# Patient Record
Sex: Female | Born: 1940 | Hispanic: Refuse to answer | Marital: Single | Smoking: Former smoker
Health system: Southern US, Community
[De-identification: ages and names within clinical notes are randomized; demographics above are authoritative.]

## PROBLEM LIST (undated history)

## (undated) DIAGNOSIS — I2510 Atherosclerotic heart disease of native coronary artery without angina pectoris: Secondary | ICD-10-CM

## (undated) DIAGNOSIS — Z8719 Personal history of other diseases of the digestive system: Secondary | ICD-10-CM

## (undated) DIAGNOSIS — R519 Headache, unspecified: Secondary | ICD-10-CM

## (undated) DIAGNOSIS — H538 Other visual disturbances: Secondary | ICD-10-CM

## (undated) DIAGNOSIS — Z972 Presence of dental prosthetic device (complete) (partial): Secondary | ICD-10-CM

## (undated) DIAGNOSIS — J189 Pneumonia, unspecified organism: Secondary | ICD-10-CM

## (undated) DIAGNOSIS — F419 Anxiety disorder, unspecified: Secondary | ICD-10-CM

## (undated) DIAGNOSIS — I739 Peripheral vascular disease, unspecified: Secondary | ICD-10-CM

## (undated) DIAGNOSIS — N183 Chronic kidney disease, stage 3 unspecified: Secondary | ICD-10-CM

## (undated) DIAGNOSIS — M316 Other giant cell arteritis: Secondary | ICD-10-CM

## (undated) DIAGNOSIS — R42 Dizziness and giddiness: Secondary | ICD-10-CM

## (undated) DIAGNOSIS — T8859XA Other complications of anesthesia, initial encounter: Secondary | ICD-10-CM

## (undated) DIAGNOSIS — T884XXA Failed or difficult intubation, initial encounter: Secondary | ICD-10-CM

## (undated) DIAGNOSIS — M1990 Unspecified osteoarthritis, unspecified site: Secondary | ICD-10-CM

## (undated) DIAGNOSIS — I10 Essential (primary) hypertension: Secondary | ICD-10-CM

## (undated) DIAGNOSIS — E039 Hypothyroidism, unspecified: Secondary | ICD-10-CM

## (undated) DIAGNOSIS — E079 Disorder of thyroid, unspecified: Secondary | ICD-10-CM

## (undated) DIAGNOSIS — D649 Anemia, unspecified: Secondary | ICD-10-CM

## (undated) DIAGNOSIS — F32A Depression, unspecified: Secondary | ICD-10-CM

## (undated) DIAGNOSIS — M5136 Other intervertebral disc degeneration, lumbar region: Secondary | ICD-10-CM

## (undated) DIAGNOSIS — R011 Cardiac murmur, unspecified: Secondary | ICD-10-CM

## (undated) DIAGNOSIS — M51369 Other intervertebral disc degeneration, lumbar region without mention of lumbar back pain or lower extremity pain: Secondary | ICD-10-CM

## (undated) HISTORY — PX: SALIVARY GLAND SURGERY: SHX768

## (undated) HISTORY — PX: TUBAL LIGATION: SHX77

---

## 1998-03-06 HISTORY — PX: THYROID SURGERY: SHX805

## 2003-05-06 ENCOUNTER — Encounter: Payer: Self-pay | Admitting: Internal Medicine

## 2003-05-07 ENCOUNTER — Encounter: Payer: Self-pay | Admitting: Internal Medicine

## 2003-12-09 ENCOUNTER — Encounter: Payer: Self-pay | Admitting: General Practice

## 2003-12-22 ENCOUNTER — Ambulatory Visit: Payer: Self-pay | Admitting: Internal Medicine

## 2004-01-05 ENCOUNTER — Encounter: Payer: Self-pay | Admitting: General Practice

## 2004-03-01 ENCOUNTER — Ambulatory Visit: Payer: Self-pay | Admitting: Unknown Physician Specialty

## 2004-03-15 ENCOUNTER — Ambulatory Visit: Payer: Self-pay | Admitting: Unknown Physician Specialty

## 2004-04-12 ENCOUNTER — Ambulatory Visit: Payer: Self-pay | Admitting: Unknown Physician Specialty

## 2005-01-27 ENCOUNTER — Ambulatory Visit: Payer: Self-pay | Admitting: Internal Medicine

## 2005-05-02 ENCOUNTER — Ambulatory Visit: Payer: Self-pay | Admitting: Unknown Physician Specialty

## 2005-08-23 ENCOUNTER — Ambulatory Visit: Payer: Self-pay | Admitting: Unknown Physician Specialty

## 2005-09-01 ENCOUNTER — Ambulatory Visit: Payer: Self-pay | Admitting: Unknown Physician Specialty

## 2006-04-02 ENCOUNTER — Ambulatory Visit: Payer: Self-pay | Admitting: Otolaryngology

## 2006-05-08 ENCOUNTER — Ambulatory Visit: Payer: Self-pay | Admitting: Otolaryngology

## 2006-08-24 ENCOUNTER — Ambulatory Visit: Payer: Self-pay | Admitting: Internal Medicine

## 2006-09-05 ENCOUNTER — Other Ambulatory Visit: Payer: Self-pay

## 2006-09-05 ENCOUNTER — Emergency Department: Payer: Self-pay | Admitting: Emergency Medicine

## 2006-09-12 ENCOUNTER — Ambulatory Visit: Payer: Self-pay | Admitting: Unknown Physician Specialty

## 2007-02-14 ENCOUNTER — Ambulatory Visit: Payer: Self-pay | Admitting: Unknown Physician Specialty

## 2007-03-12 ENCOUNTER — Encounter: Payer: Self-pay | Admitting: Psychiatry

## 2007-04-07 ENCOUNTER — Encounter: Payer: Self-pay | Admitting: Psychiatry

## 2007-05-05 ENCOUNTER — Encounter: Payer: Self-pay | Admitting: Psychiatry

## 2007-05-17 ENCOUNTER — Other Ambulatory Visit: Payer: Self-pay

## 2007-05-17 ENCOUNTER — Emergency Department: Payer: Self-pay | Admitting: Emergency Medicine

## 2007-06-21 ENCOUNTER — Ambulatory Visit: Payer: Self-pay | Admitting: Otolaryngology

## 2007-09-25 ENCOUNTER — Ambulatory Visit: Payer: Self-pay | Admitting: Unknown Physician Specialty

## 2008-09-25 ENCOUNTER — Ambulatory Visit: Payer: Self-pay | Admitting: Unknown Physician Specialty

## 2009-01-07 ENCOUNTER — Encounter: Payer: Self-pay | Admitting: Internal Medicine

## 2009-01-11 ENCOUNTER — Encounter: Payer: Self-pay | Admitting: Internal Medicine

## 2009-01-11 ENCOUNTER — Ambulatory Visit: Payer: Self-pay | Admitting: Unknown Physician Specialty

## 2009-01-18 ENCOUNTER — Encounter: Payer: Self-pay | Admitting: Internal Medicine

## 2009-01-21 ENCOUNTER — Encounter (INDEPENDENT_AMBULATORY_CARE_PROVIDER_SITE_OTHER): Payer: Self-pay | Admitting: *Deleted

## 2009-02-05 ENCOUNTER — Ambulatory Visit: Payer: Self-pay | Admitting: Internal Medicine

## 2009-02-05 DIAGNOSIS — K5732 Diverticulitis of large intestine without perforation or abscess without bleeding: Secondary | ICD-10-CM | POA: Insufficient documentation

## 2009-02-05 DIAGNOSIS — K589 Irritable bowel syndrome without diarrhea: Secondary | ICD-10-CM

## 2009-02-23 ENCOUNTER — Telehealth: Payer: Self-pay | Admitting: Internal Medicine

## 2009-06-30 ENCOUNTER — Ambulatory Visit: Payer: Self-pay | Admitting: Urology

## 2009-09-27 ENCOUNTER — Ambulatory Visit: Payer: Self-pay | Admitting: Unknown Physician Specialty

## 2010-09-29 ENCOUNTER — Ambulatory Visit: Payer: Self-pay | Admitting: Unknown Physician Specialty

## 2011-05-26 ENCOUNTER — Ambulatory Visit: Payer: Self-pay | Admitting: Unknown Physician Specialty

## 2011-06-06 ENCOUNTER — Encounter: Payer: Self-pay | Admitting: Unknown Physician Specialty

## 2011-07-05 ENCOUNTER — Encounter: Payer: Self-pay | Admitting: Unknown Physician Specialty

## 2011-10-19 ENCOUNTER — Ambulatory Visit: Payer: Self-pay | Admitting: Unknown Physician Specialty

## 2012-04-06 ENCOUNTER — Emergency Department: Payer: Self-pay | Admitting: Emergency Medicine

## 2012-04-06 LAB — CBC
MCV: 95 fL (ref 80–100)
Platelet: 247 10*3/uL (ref 150–440)
RBC: 3.83 10*6/uL (ref 3.80–5.20)
RDW: 13.1 % (ref 11.5–14.5)
WBC: 8.2 10*3/uL (ref 3.6–11.0)

## 2012-04-06 LAB — COMPREHENSIVE METABOLIC PANEL
Albumin: 3.5 g/dL (ref 3.4–5.0)
BUN: 15 mg/dL (ref 7–18)
Bilirubin,Total: 0.3 mg/dL (ref 0.2–1.0)
Chloride: 107 mmol/L (ref 98–107)
Creatinine: 1.35 mg/dL — ABNORMAL HIGH (ref 0.60–1.30)
EGFR (African American): 45 — ABNORMAL LOW
Glucose: 104 mg/dL — ABNORMAL HIGH (ref 65–99)
Osmolality: 284 (ref 275–301)
SGPT (ALT): 20 U/L (ref 12–78)
Total Protein: 7.1 g/dL (ref 6.4–8.2)

## 2012-04-07 LAB — MAGNESIUM: Magnesium: 2 mg/dL

## 2012-04-07 LAB — TROPONIN I: Troponin-I: <0.02 ng/mL

## 2012-04-07 LAB — CK TOTAL AND CKMB (NOT AT ARMC): CK, Total: 119 U/L (ref 21–215)

## 2012-10-21 ENCOUNTER — Ambulatory Visit: Payer: Self-pay | Admitting: Unknown Physician Specialty

## 2012-12-26 ENCOUNTER — Emergency Department: Payer: Self-pay | Admitting: Internal Medicine

## 2012-12-30 ENCOUNTER — Emergency Department: Payer: Self-pay | Admitting: Emergency Medicine

## 2012-12-30 LAB — COMPREHENSIVE METABOLIC PANEL
Alkaline Phosphatase: 72 U/L (ref 50–136)
Bilirubin,Total: 0.2 mg/dL (ref 0.2–1.0)
Calcium, Total: 8.4 mg/dL — ABNORMAL LOW (ref 8.5–10.1)
Chloride: 109 mmol/L — ABNORMAL HIGH (ref 98–107)
EGFR (African American): 42 — ABNORMAL LOW
Osmolality: 280 (ref 275–301)
SGOT(AST): 32 U/L (ref 15–37)
SGPT (ALT): 23 U/L (ref 12–78)
Sodium: 139 mmol/L (ref 136–145)
Total Protein: 6.9 g/dL (ref 6.4–8.2)

## 2012-12-30 LAB — CBC
HCT: 34 % — ABNORMAL LOW (ref 35.0–47.0)
MCH: 33 pg (ref 26.0–34.0)
Platelet: 211 10*3/uL (ref 150–440)
RDW: 13.3 % (ref 11.5–14.5)

## 2012-12-30 LAB — URINALYSIS, COMPLETE
Nitrite: NEGATIVE
Ph: 6 (ref 4.5–8.0)
Protein: NEGATIVE
RBC,UR: 5 /HPF (ref 0–5)

## 2013-01-04 ENCOUNTER — Emergency Department: Payer: Self-pay | Admitting: Emergency Medicine

## 2013-05-07 ENCOUNTER — Encounter: Payer: Self-pay | Admitting: Internal Medicine

## 2013-07-31 DIAGNOSIS — E559 Vitamin D deficiency, unspecified: Secondary | ICD-10-CM | POA: Insufficient documentation

## 2013-08-01 ENCOUNTER — Ambulatory Visit: Payer: Self-pay | Admitting: Unknown Physician Specialty

## 2013-08-06 LAB — PATHOLOGY REPORT

## 2013-09-24 ENCOUNTER — Encounter: Payer: Self-pay | Admitting: Internal Medicine

## 2013-10-06 ENCOUNTER — Encounter: Payer: Self-pay | Admitting: Internal Medicine

## 2013-10-07 ENCOUNTER — Ambulatory Visit: Payer: Self-pay | Admitting: Obstetrics and Gynecology

## 2013-10-22 ENCOUNTER — Ambulatory Visit: Payer: Self-pay | Admitting: Internal Medicine

## 2013-11-04 ENCOUNTER — Encounter: Payer: Self-pay | Admitting: Internal Medicine

## 2013-11-29 ENCOUNTER — Encounter: Payer: Self-pay | Admitting: Internal Medicine

## 2014-02-10 DIAGNOSIS — E782 Mixed hyperlipidemia: Secondary | ICD-10-CM | POA: Insufficient documentation

## 2014-02-10 DIAGNOSIS — I10 Essential (primary) hypertension: Secondary | ICD-10-CM | POA: Insufficient documentation

## 2014-02-18 ENCOUNTER — Ambulatory Visit: Payer: Self-pay | Admitting: Internal Medicine

## 2014-04-21 DIAGNOSIS — E890 Postprocedural hypothyroidism: Secondary | ICD-10-CM | POA: Insufficient documentation

## 2014-08-10 ENCOUNTER — Other Ambulatory Visit: Payer: Self-pay | Admitting: Internal Medicine

## 2014-08-10 DIAGNOSIS — Z1231 Encounter for screening mammogram for malignant neoplasm of breast: Secondary | ICD-10-CM

## 2014-10-26 ENCOUNTER — Ambulatory Visit: Payer: Self-pay | Attending: Internal Medicine

## 2014-10-30 DIAGNOSIS — N183 Chronic kidney disease, stage 3 unspecified: Secondary | ICD-10-CM | POA: Insufficient documentation

## 2014-11-05 ENCOUNTER — Ambulatory Visit: Payer: Self-pay

## 2014-11-13 ENCOUNTER — Ambulatory Visit
Admission: RE | Admit: 2014-11-13 | Discharge: 2014-11-13 | Disposition: A | Discharge status: Home / Self Care | Payer: Medicare Other | Source: Ambulatory Visit | Attending: Internal Medicine | Admitting: Internal Medicine

## 2014-11-13 ENCOUNTER — Other Ambulatory Visit: Payer: Self-pay | Admitting: Internal Medicine

## 2014-11-13 DIAGNOSIS — R911 Solitary pulmonary nodule: Secondary | ICD-10-CM | POA: Insufficient documentation

## 2014-11-13 DIAGNOSIS — Z1231 Encounter for screening mammogram for malignant neoplasm of breast: Secondary | ICD-10-CM

## 2015-03-08 ENCOUNTER — Emergency Department
Admission: EM | Admit: 2015-03-08 | Discharge: 2015-03-08 | Disposition: A | Discharge status: Home / Self Care | Payer: Medicare Other | Attending: Emergency Medicine | Admitting: Emergency Medicine

## 2015-03-08 ENCOUNTER — Emergency Department: Payer: Medicare Other

## 2015-03-08 ENCOUNTER — Encounter: Payer: Self-pay | Admitting: Emergency Medicine

## 2015-03-08 DIAGNOSIS — I10 Essential (primary) hypertension: Secondary | ICD-10-CM | POA: Insufficient documentation

## 2015-03-08 DIAGNOSIS — R51 Headache: Secondary | ICD-10-CM | POA: Insufficient documentation

## 2015-03-08 DIAGNOSIS — Z880 Allergy status to penicillin: Secondary | ICD-10-CM | POA: Diagnosis not present

## 2015-03-08 DIAGNOSIS — R519 Headache, unspecified: Secondary | ICD-10-CM

## 2015-03-08 HISTORY — DX: Essential (primary) hypertension: I10

## 2015-03-08 HISTORY — DX: Disorder of thyroid, unspecified: E07.9

## 2015-03-08 LAB — CBC
HCT: 37.3 % (ref 35.0–47.0)
HEMOGLOBIN: 12.6 g/dL (ref 12.0–16.0)
MCH: 32.1 pg (ref 26.0–34.0)
MCHC: 33.9 g/dL (ref 32.0–36.0)
MCV: 94.8 fL (ref 80.0–100.0)
Platelets: 240 10*3/uL (ref 150–440)
RBC: 3.94 MIL/uL (ref 3.80–5.20)
RDW: 13.3 % (ref 11.5–14.5)
WBC: 8.6 10*3/uL (ref 3.6–11.0)

## 2015-03-08 LAB — BASIC METABOLIC PANEL
ANION GAP: 7 (ref 5–15)
BUN: 16 mg/dL (ref 6–20)
CALCIUM: 9.5 mg/dL (ref 8.9–10.3)
CO2: 27 mmol/L (ref 22–32)
Chloride: 108 mmol/L (ref 101–111)
Creatinine, Ser: 1.14 mg/dL — ABNORMAL HIGH (ref 0.44–1.00)
GFR calc Af Amer: 53 mL/min — ABNORMAL LOW (ref >60)
GFR calc non Af Amer: 46 mL/min — ABNORMAL LOW (ref >60)
GLUCOSE: 105 mg/dL — AB (ref 65–99)
Potassium: 3.8 mmol/L (ref 3.5–5.1)
Sodium: 142 mmol/L (ref 135–145)

## 2015-03-08 LAB — URINALYSIS COMPLETE WITH MICROSCOPIC (ARMC ONLY)
BACTERIA UA: NONE SEEN
BILIRUBIN URINE: NEGATIVE
GLUCOSE, UA: NEGATIVE mg/dL
Ketones, ur: NEGATIVE mg/dL
Nitrite: NEGATIVE
Protein, ur: NEGATIVE mg/dL
SPECIFIC GRAVITY, URINE: 1.009 (ref 1.005–1.030)
pH: 6 (ref 5.0–8.0)

## 2015-03-08 MED ORDER — HYDRALAZINE HCL 20 MG/ML IJ SOLN
10.0000 mg | Freq: Once | INTRAMUSCULAR | Status: AC
  Administered 2015-03-08: 10 mg via INTRAVENOUS

## 2015-03-08 MED ORDER — HYDRALAZINE HCL 20 MG/ML IJ SOLN
INTRAMUSCULAR | Status: AC
  Administered 2015-03-08: 10 mg via INTRAVENOUS
  Filled 2015-03-08: qty 1

## 2015-03-08 MED ORDER — BUTALBITAL-APAP-CAFFEINE 50-325-40 MG PO TABS
2.0000 | ORAL_TABLET | Freq: Once | ORAL | Status: AC
  Administered 2015-03-08: 2 via ORAL

## 2015-03-08 MED ORDER — LOSARTAN POTASSIUM 25 MG PO TABS
25.0000 mg | ORAL_TABLET | Freq: Once | ORAL | Status: AC
  Administered 2015-03-08: 25 mg via ORAL
  Filled 2015-03-08 (×2): qty 1

## 2015-03-08 MED ORDER — BUTALBITAL-APAP-CAFFEINE 50-325-40 MG PO TABS
ORAL_TABLET | ORAL | Status: AC
  Filled 2015-03-08: qty 2

## 2015-03-08 MED ORDER — LORAZEPAM 2 MG/ML IJ SOLN
1.0000 mg | Freq: Once | INTRAMUSCULAR | Status: AC
  Administered 2015-03-08: 1 mg via INTRAVENOUS
  Filled 2015-03-08: qty 1

## 2015-03-08 NOTE — Discharge Instructions (Signed)
General Headache Without Cause °A headache is pain or discomfort felt around the head or neck area. The specific cause of a headache may not be found. There are many causes and types of headaches. A few common ones are: °· Tension headaches. °· Migraine headaches. °· Cluster headaches. °· Chronic daily headaches. °HOME CARE INSTRUCTIONS  °Watch your condition for any changes. Take these steps to help with your condition: °Managing Pain °· Take over-the-counter and prescription medicines only as told by your health care provider. °· Lie down in a dark, quiet room when you have a headache. °· If directed, apply ice to the head and neck area: °· Put ice in a plastic bag. °· Place a towel between your skin and the bag. °· Leave the ice on for 20 minutes, 2-3 times per day. °· Use a heating pad or hot shower to apply heat to the head and neck area as told by your health care provider. °· Keep lights dim if bright lights bother you or make your headaches worse. °Eating and Drinking °· Eat meals on a regular schedule. °· Limit alcohol use. °· Decrease the amount of caffeine you drink, or stop drinking caffeine. °General Instructions °· Keep all follow-up visits as told by your health care provider. This is important. °· Keep a headache journal to help find out what may trigger your headaches. For example, write down: °· What you eat and drink. °· How much sleep you get. °· Any change to your diet or medicines. °· Try massage or other relaxation techniques. °· Limit stress. °· Sit up straight, and do not tense your muscles. °· Do not use tobacco products, including cigarettes, chewing tobacco, or e-cigarettes. If you need help quitting, ask your health care provider. °· Exercise regularly as told by your health care provider. °· Sleep on a regular schedule. Get 7-9 hours of sleep, or the amount recommended by your health care provider. °SEEK MEDICAL CARE IF:  °· Your symptoms are not helped by medicine. °· You have a  headache that is different from the usual headache. °· You have nausea or you vomit. °· You have a fever. °SEEK IMMEDIATE MEDICAL CARE IF:  °· Your headache becomes severe. °· You have repeated vomiting. °· You have a stiff neck. °· You have a loss of vision. °· You have problems with speech. °· You have pain in the eye or ear. °· You have muscular weakness or loss of muscle control. °· You lose your balance or have trouble walking. °· You feel faint or pass out. °· You have confusion. °  °This information is not intended to replace advice given to you by your health care provider. Make sure you discuss any questions you have with your health care provider. °  °Document Released: 02/20/2005 Document Revised: 11/11/2014 Document Reviewed: 06/15/2014 °Elsevier Interactive Patient Education ©2016 [employer] ° °Hypertension °Hypertension is another name for high blood pressure. High blood pressure forces your heart to work harder to pump blood. A blood pressure reading has two numbers, which includes a higher number over a lower number (example: 110/72). °HOME CARE  °· Have your blood pressure rechecked by your doctor. °· Only take medicine as told by your doctor. Follow the directions carefully. The medicine does not work as well if you skip doses. Skipping doses also puts you at risk for problems. °· Do not smoke. °· Monitor your blood pressure at home as told by your doctor. °GET HELP IF: °· You think you   are having a reaction to the medicine you are taking. °· You have repeat headaches or feel dizzy. °· You have puffiness (swelling) in your ankles. °· You have trouble with your vision. °GET HELP RIGHT AWAY IF:  °· You get a very bad headache and are confused. °· You feel weak, numb, or faint. °· You get chest or belly (abdominal) pain. °· You throw up (vomit). °· You cannot breathe very well. °MAKE SURE YOU:  °· Understand these instructions. °· Will watch your condition. °· Will get help right away if you are  not doing well or get worse. °  °This information is not intended to replace advice given to you by your health care provider. Make sure you discuss any questions you have with your health care provider. °  °Document Released: 08/09/2007 Document Revised: 02/25/2013 Document Reviewed: 12/13/2012 °Elsevier Interactive Patient Education ©2016 [employer]vier Inc[employer] ° °

## 2015-03-08 NOTE — ED Notes (Signed)
Pt to CT via stretcher

## 2015-03-08 NOTE — ED Provider Notes (Signed)
[facility] Emergency Department Provider Note  Time seen: 7:28 PM  I have reviewed the triage vital signs and the nursing notes.   HISTORY  Chief Complaint Hypertension; Dizziness; and Headache    HPI Kristi Maldonado is a 75 y.o. female with a past medical history of hypertension, hypothyroidism, who presents the emergency department with hypertension and headache. According to the patient for the past 3 days she has been expressing intermittent headaches. She states this was very abnormal for her so when she developed a headache today she took her blood pressure at home and it was 200 systolic. She states she went to a neighbor's house and took her blood pressure there as well and it was over 200. She then went to another neighbor's house was a Engineer, civil (consulting) and took her blood pressure there and it was 200 so she came to the emergency department for evaluation. States a history of hypertension for which she takes Cozaar 25 mg daily. States she continues to have a mild headache currently. Denies any slurred speech, confusion, focal weakness or numbness.Patient states her main concern is she does not want to "stroke out" from her high blood pressure.     Past Medical History  Diagnosis Date  . Hypertension   . Thyroid disease     Patient Active Problem List   Diagnosis Date Noted  . DIVERTICULITIS, COLON 02/05/2009  . IBS 02/05/2009    No past surgical history on file.  No current outpatient prescriptions on file.  Allergies Penicillins  Family History  Problem Relation Age of Onset  . Breast cancer Maternal Aunt     two aunts    Social History Social History  Substance Use Topics  . Smoking status: Never Smoker   . Smokeless tobacco: None  . Alcohol Use: No    Review of Systems Constitutional: Negative for fever. Cardiovascular: Negative for chest pain. Respiratory: Negative for shortness of breath. Gastrointestinal: Negative for abdominal  pain Neurological: Positive for headache. Denies focal weakness or numbness. States headaches are very abnormal for her. 10-point ROS otherwise negative.  ____________________________________________   PHYSICAL EXAM:  VITAL SIGNS: ED Triage Vitals  Enc Vitals Group     BP 03/08/15 1845 176/80 mmHg     Pulse Rate 03/08/15 1845 62     Resp 03/08/15 1845 20     Temp 03/08/15 1845 98.1 F (36.7 C)     Temp Source 03/08/15 1845 Oral     SpO2 03/08/15 1845 99 %     Weight 03/08/15 1845 140 lb (63.504 kg)     Height 03/08/15 1845 5\' 7"  (1.702 m)     Head Cir --      Peak Flow --      Pain Score 03/08/15 1841 3     Pain Loc --      Pain Edu? --      Excl. in GC? --     Constitutional: Alert and oriented. Well appearing, appears anxious. Eyes: Normal exam ENT   Head: Normocephalic and atraumatic.   Mouth/Throat: Mucous membranes are moist. Cardiovascular: Normal rate, regular rhythm. No murmur Respiratory: Normal respiratory effort without tachypnea nor retractions. Breath sounds are clear and equal bilaterally. No wheezes/rales/rhonchi. Gastrointestinal: Soft and nontender. No distention. Musculoskeletal: Nontender with normal range of motion in all extremities. Neurologic:  Normal speech and language. No gross focal neurologic deficits Skin:  Skin is warm, dry and intact.  Psychiatric: Mood and affect are normal. Speech and behavior are normal.  ____________________________________________     RADIOLOGY  CT head shows no acute abnormality  EKG reviewed and interpreted by myself shows sinus bradycardia at 55 bpm, narrow QRS, normal axis, normal interval, no ST changes. Overall normal EKG. ____________________________________________   INITIAL IMPRESSION / ASSESSMENT AND PLAN / ED COURSE  Pertinent labs & imaging results that were available during my care of the patient were reviewed by me and considered in my medical decision making (see chart for  details).  Patient presents the emergency department for hypertension greater in 200 systolic at home, also with 3 days of intermittent headache. We will check basic labs, CT head, and treat the patient with Ativan given her considerable anxiety over her blood pressure, as well as dose her Cozaar which she states she forgot to take today. Patient has a very normal exam besides appearing anxious, no neuro deficits identified.  CT head shows no acute abnormality. Labs are within normal limits. Patient continues have blood pressure in excess of 180/190. Dose hydralazine in the emergency department. Patient states her normal blood pressures approximately 110-120 systolic.  Patient's blood pressure 174/79. We'll discharge patient home with primary care follow-up. Patient agreeable to plan. ____________________________________________   FINAL CLINICAL IMPRESSION(S) / ED DIAGNOSES  Headache Hypertension   Minna AntisKevin Rosemond Lyttle, MD 03/08/15 2103

## 2015-03-08 NOTE — ED Notes (Signed)
Pt reports HTN today, dizziness and headache. Reports sx started about 1730. Pt alert and oriented upon arrival to ED, bilateral grip strength equal, no facial droop noted.

## 2015-03-08 NOTE — ED Notes (Signed)
MD at bedside. 

## 2015-03-23 ENCOUNTER — Other Ambulatory Visit: Payer: Self-pay | Admitting: Internal Medicine

## 2015-03-23 DIAGNOSIS — R911 Solitary pulmonary nodule: Secondary | ICD-10-CM

## 2015-04-01 ENCOUNTER — Ambulatory Visit: Payer: Medicare Other

## 2015-06-11 ENCOUNTER — Ambulatory Visit
Admission: RE | Admit: 2015-06-11 | Discharge: 2015-06-11 | Disposition: A | Discharge status: Home / Self Care | Payer: Medicare Other | Source: Ambulatory Visit | Attending: Internal Medicine | Admitting: Internal Medicine

## 2015-06-11 DIAGNOSIS — I700 Atherosclerosis of aorta: Secondary | ICD-10-CM | POA: Diagnosis not present

## 2015-06-11 DIAGNOSIS — J479 Bronchiectasis, uncomplicated: Secondary | ICD-10-CM | POA: Insufficient documentation

## 2015-06-11 DIAGNOSIS — R911 Solitary pulmonary nodule: Secondary | ICD-10-CM | POA: Insufficient documentation

## 2015-06-11 LAB — POCT I-STAT CREATININE: CREATININE: 1.2 mg/dL — AB (ref 0.44–1.00)

## 2015-06-11 MED ORDER — IOPAMIDOL (ISOVUE-300) INJECTION 61%
75.0000 mL | Freq: Once | INTRAVENOUS | Status: AC | PRN
  Administered 2015-06-11: 75 mL via INTRAVENOUS

## 2016-01-19 ENCOUNTER — Other Ambulatory Visit: Payer: Self-pay | Admitting: Internal Medicine

## 2016-01-19 DIAGNOSIS — Z1231 Encounter for screening mammogram for malignant neoplasm of breast: Secondary | ICD-10-CM

## 2016-03-02 ENCOUNTER — Ambulatory Visit
Admission: RE | Admit: 2016-03-02 | Discharge: 2016-03-02 | Disposition: A | Discharge status: Home / Self Care | Payer: Medicare Other | Source: Ambulatory Visit | Attending: Internal Medicine | Admitting: Internal Medicine

## 2016-03-02 DIAGNOSIS — Z1231 Encounter for screening mammogram for malignant neoplasm of breast: Secondary | ICD-10-CM | POA: Insufficient documentation

## 2016-04-28 ENCOUNTER — Other Ambulatory Visit: Payer: Self-pay | Admitting: Internal Medicine

## 2016-04-28 DIAGNOSIS — R911 Solitary pulmonary nodule: Secondary | ICD-10-CM

## 2016-05-08 ENCOUNTER — Ambulatory Visit
Admission: RE | Admit: 2016-05-08 | Discharge: 2016-05-08 | Disposition: A | Discharge status: Home / Self Care | Payer: Medicare Other | Source: Ambulatory Visit | Attending: Internal Medicine | Admitting: Internal Medicine

## 2016-05-08 DIAGNOSIS — R911 Solitary pulmonary nodule: Secondary | ICD-10-CM | POA: Diagnosis present

## 2016-05-08 DIAGNOSIS — K449 Diaphragmatic hernia without obstruction or gangrene: Secondary | ICD-10-CM | POA: Insufficient documentation

## 2016-05-08 DIAGNOSIS — R918 Other nonspecific abnormal finding of lung field: Secondary | ICD-10-CM | POA: Diagnosis not present

## 2016-06-22 ENCOUNTER — Other Ambulatory Visit: Payer: Self-pay | Admitting: Internal Medicine

## 2016-06-22 DIAGNOSIS — M5137 Other intervertebral disc degeneration, lumbosacral region: Secondary | ICD-10-CM

## 2016-06-22 DIAGNOSIS — M5416 Radiculopathy, lumbar region: Secondary | ICD-10-CM

## 2016-06-30 ENCOUNTER — Ambulatory Visit
Admission: RE | Admit: 2016-06-30 | Discharge: 2016-06-30 | Disposition: A | Discharge status: Home / Self Care | Payer: Medicare Other | Source: Ambulatory Visit | Attending: Internal Medicine | Admitting: Internal Medicine

## 2016-06-30 DIAGNOSIS — K5730 Diverticulosis of large intestine without perforation or abscess without bleeding: Secondary | ICD-10-CM | POA: Diagnosis not present

## 2016-06-30 DIAGNOSIS — M4185 Other forms of scoliosis, thoracolumbar region: Secondary | ICD-10-CM | POA: Insufficient documentation

## 2016-06-30 DIAGNOSIS — M5416 Radiculopathy, lumbar region: Secondary | ICD-10-CM | POA: Insufficient documentation

## 2016-06-30 DIAGNOSIS — M4804 Spinal stenosis, thoracic region: Secondary | ICD-10-CM | POA: Insufficient documentation

## 2016-06-30 DIAGNOSIS — M48061 Spinal stenosis, lumbar region without neurogenic claudication: Secondary | ICD-10-CM | POA: Diagnosis not present

## 2016-06-30 DIAGNOSIS — M5137 Other intervertebral disc degeneration, lumbosacral region: Secondary | ICD-10-CM | POA: Insufficient documentation

## 2016-06-30 DIAGNOSIS — R609 Edema, unspecified: Secondary | ICD-10-CM | POA: Diagnosis not present

## 2016-06-30 LAB — POCT I-STAT CREATININE: CREATININE: 1.3 mg/dL — AB (ref 0.44–1.00)

## 2016-06-30 MED ORDER — GADOBENATE DIMEGLUMINE 529 MG/ML IV SOLN
10.0000 mL | Freq: Once | INTRAVENOUS | Status: AC | PRN
  Administered 2016-06-30: 10 mL via INTRAVENOUS

## 2017-01-29 ENCOUNTER — Other Ambulatory Visit: Payer: Self-pay | Admitting: Internal Medicine

## 2017-01-29 DIAGNOSIS — Z1231 Encounter for screening mammogram for malignant neoplasm of breast: Secondary | ICD-10-CM

## 2017-02-05 ENCOUNTER — Other Ambulatory Visit: Payer: Self-pay | Admitting: Internal Medicine

## 2017-02-05 ENCOUNTER — Ambulatory Visit
Admission: RE | Admit: 2017-02-05 | Discharge: 2017-02-05 | Disposition: A | Discharge status: Home / Self Care | Payer: Medicare Other | Source: Ambulatory Visit | Attending: Internal Medicine | Admitting: Internal Medicine

## 2017-02-05 ENCOUNTER — Ambulatory Visit: Payer: Medicare Other

## 2017-02-05 DIAGNOSIS — M419 Scoliosis, unspecified: Secondary | ICD-10-CM | POA: Diagnosis not present

## 2017-02-05 DIAGNOSIS — R109 Unspecified abdominal pain: Secondary | ICD-10-CM | POA: Diagnosis present

## 2017-02-05 DIAGNOSIS — K429 Umbilical hernia without obstruction or gangrene: Secondary | ICD-10-CM | POA: Diagnosis not present

## 2017-02-05 DIAGNOSIS — N200 Calculus of kidney: Secondary | ICD-10-CM | POA: Diagnosis not present

## 2017-02-15 ENCOUNTER — Emergency Department
Admission: EM | Admit: 2017-02-15 | Discharge: 2017-02-15 | Disposition: A | Discharge status: Home / Self Care | Payer: Medicare Other | Attending: Emergency Medicine | Admitting: Emergency Medicine

## 2017-02-15 ENCOUNTER — Other Ambulatory Visit: Payer: Self-pay

## 2017-02-15 ENCOUNTER — Encounter: Payer: Self-pay | Admitting: Emergency Medicine

## 2017-02-15 DIAGNOSIS — R1110 Vomiting, unspecified: Secondary | ICD-10-CM | POA: Diagnosis present

## 2017-02-15 DIAGNOSIS — K529 Noninfective gastroenteritis and colitis, unspecified: Secondary | ICD-10-CM | POA: Insufficient documentation

## 2017-02-15 LAB — CBC WITH DIFFERENTIAL/PLATELET
Basophils Absolute: 0.1 10*3/uL (ref 0–0.1)
Basophils Relative: 1 %
Eosinophils Absolute: 0.1 10*3/uL (ref 0–0.7)
Eosinophils Relative: 1 %
HEMATOCRIT: 37.3 % (ref 35.0–47.0)
HEMOGLOBIN: 13 g/dL (ref 12.0–16.0)
LYMPHS ABS: 1.7 10*3/uL (ref 1.0–3.6)
Lymphocytes Relative: 17 %
MCH: 32.9 pg (ref 26.0–34.0)
MCHC: 34.8 g/dL (ref 32.0–36.0)
MCV: 94.6 fL (ref 80.0–100.0)
MONOS PCT: 5 %
Monocytes Absolute: 0.5 10*3/uL (ref 0.2–0.9)
NEUTROS ABS: 7.7 10*3/uL — AB (ref 1.4–6.5)
NEUTROS PCT: 76 %
Platelets: 246 10*3/uL (ref 150–440)
RBC: 3.95 MIL/uL (ref 3.80–5.20)
RDW: 13.7 % (ref 11.5–14.5)
WBC: 10.1 10*3/uL (ref 3.6–11.0)

## 2017-02-15 LAB — COMPREHENSIVE METABOLIC PANEL
ALT: 17 U/L (ref 14–54)
ANION GAP: 9 (ref 5–15)
AST: 26 U/L (ref 15–41)
Albumin: 4 g/dL (ref 3.5–5.0)
Alkaline Phosphatase: 64 U/L (ref 38–126)
BILIRUBIN TOTAL: 0.3 mg/dL (ref 0.3–1.2)
BUN: 24 mg/dL — ABNORMAL HIGH (ref 6–20)
CALCIUM: 9.5 mg/dL (ref 8.9–10.3)
CO2: 25 mmol/L (ref 22–32)
Chloride: 105 mmol/L (ref 101–111)
Creatinine, Ser: 1.37 mg/dL — ABNORMAL HIGH (ref 0.44–1.00)
GFR, EST AFRICAN AMERICAN: 42 mL/min — AB (ref >60)
GFR, EST NON AFRICAN AMERICAN: 36 mL/min — AB (ref >60)
GLUCOSE: 125 mg/dL — AB (ref 65–99)
Potassium: 3.3 mmol/L — ABNORMAL LOW (ref 3.5–5.1)
Sodium: 139 mmol/L (ref 135–145)
TOTAL PROTEIN: 7.6 g/dL (ref 6.5–8.1)

## 2017-02-15 LAB — LIPASE, BLOOD: Lipase: 38 U/L (ref 11–51)

## 2017-02-15 MED ORDER — ONDANSETRON 4 MG PO TBDP: 4.0000 mg | ORAL_TABLET | Freq: Three times a day (TID) | ORAL | 0 refills | Status: DC | PRN

## 2017-02-15 MED ORDER — ONDANSETRON HCL 4 MG/2ML IJ SOLN
4.0000 mg | Freq: Once | INTRAMUSCULAR | Status: AC
  Administered 2017-02-15: 4 mg via INTRAVENOUS
  Filled 2017-02-15: qty 2

## 2017-02-15 MED ORDER — SODIUM CHLORIDE 0.9 % IV BOLUS (SEPSIS)
1000.0000 mL | Freq: Once | INTRAVENOUS | Status: AC
  Administered 2017-02-15: 1000 mL via INTRAVENOUS

## 2017-02-15 MED ORDER — POTASSIUM CHLORIDE 20 MEQ PO PACK
40.0000 meq | PACK | Freq: Once | ORAL | Status: AC
  Administered 2017-02-15: 40 meq via ORAL
  Filled 2017-02-15: qty 2

## 2017-02-15 NOTE — ED Triage Notes (Signed)
Pt arrived to the ED from home for complaints of vomiting and diarrhea. Pt states that she woke up with nausea, vomiting and 4 episodes of diarrhea. Pt is AOx4 in no apparent distress.

## 2017-02-15 NOTE — ED Provider Notes (Signed)
[facility] Emergency Department Provider Note   First MD Initiated Contact with Patient 02/15/17 [phone]     (approximate)  I have reviewed the triage vital signs and the nursing notes.   HISTORY  Chief Complaint Emesis and Diarrhea    HPI Towana BadgerBarbara Harren is a 76 y.o. female to the emergency department acute onset of vomiting and diarrhea this morning.  Patient states that she has had multiple episodes of vomiting and 4 episodes of nonbloody diarrhea since awakening this morning.  The patient does admit to abdominal cramping.  Patient states that she ate a chicken salad sandwich last night and is concerned that this may be the culprit of her symptoms.  Patient denies any fever no urinary symptoms.   Past Medical History:  Diagnosis Date  . Hypertension   . Thyroid disease     Patient Active Problem List   Diagnosis Date Noted  . DIVERTICULITIS, COLON 02/05/2009  . IBS 02/05/2009    History reviewed. No pertinent surgical history.  Prior to Admission medications   Not on File    Allergies Penicillins  Family History  Problem Relation Age of Onset  . Breast cancer Maternal Aunt        two aunts  . Breast cancer Cousin     Social History Social History   Tobacco Use  . Smoking status: Never Smoker  . Smokeless tobacco: Never Used  Substance Use Topics  . Alcohol use: No  . Drug use: No    Review of Systems Constitutional: No fever/chills Eyes: No visual changes. ENT: No sore throat. Cardiovascular: Denies chest pain. Respiratory: Denies shortness of breath. Gastrointestinal: No abdominal pain.  No nausea, no vomiting.  No diarrhea.  No constipation. Genitourinary: Negative for dysuria. Musculoskeletal: Negative for neck pain.  Negative for back pain. Integumentary: Negative for rash. Neurological: Negative for headaches, focal weakness or numbness.   ____________________________________________   PHYSICAL EXAM:  VITAL  SIGNS: ED Triage Vitals  Enc Vitals Group     BP 02/15/17 0501 (!) 156/69     Pulse Rate 02/15/17 0501 (!) 53     Resp 02/15/17 0501 16     Temp 02/15/17 0501 97.7 F (36.5 C)     Temp Source 02/15/17 0501 Oral     SpO2 02/15/17 0501 99 %     Weight 02/15/17 0502 65.8 kg (145 lb)     Height 02/15/17 0502 1.727 m (5\' 8" )     Head Circumference --      Peak Flow --      Pain Score 02/15/17 0501 0     Pain Loc --      Pain Edu? --      Excl. in GC? --     Constitutional: Alert and oriented. Well appearing and in no acute distress. Eyes: Conjunctivae are normal.  Head: Atraumatic. Mouth/Throat: Mucous membranes are moist.  Oropharynx non-erythematous. Neck: No stridor. Cardiovascular: Normal rate, regular rhythm. Good peripheral circulation. Grossly normal heart sounds. Respiratory: Normal respiratory effort.  No retractions. Lungs CTAB. Gastrointestinal: Soft and nontender. No distention.  Musculoskeletal: No lower extremity tenderness nor edema. No gross deformities of extremities. Neurologic:  Normal speech and language. No gross focal neurologic deficits are appreciated.  Skin:  Skin is warm, dry and intact. No rash noted. Psychiatric: Mood and affect are normal. Speech and behavior are normal.  ____________________________________________   LABS (all labs ordered are listed, but only abnormal results are displayed)  Labs Reviewed  CBC  WITH DIFFERENTIAL/PLATELET - Abnormal; Notable for the following components:      Result Value   Neutro Abs 7.7 (*)    All other components within normal limits  COMPREHENSIVE METABOLIC PANEL - Abnormal; Notable for the following components:   Potassium 3.3 (*)    Glucose, Bld 125 (*)    BUN 24 (*)    Creatinine, Ser 1.37 (*)    GFR calc non Af Amer 36 (*)    GFR calc Af Amer 42 (*)    All other components within normal limits  GASTROINTESTINAL PANEL BY PCR, STOOL (REPLACES STOOL CULTURE)  C DIFFICILE QUICK SCREEN W PCR REFLEX   LIPASE, BLOOD    Procedures   ____________________________________________   INITIAL IMPRESSION / ASSESSMENT AND PLAN / ED COURSE  As part of my medical decision making, I reviewed the following data within the electronic MEDICAL RECORD [id number] year old female presented with above-stated history and physical exam consistent with acute gastroenteritis.  Patient received IV normal saline Zofran with complete resolution of all symptoms while in the emergency department.  Plan was to obtain a stool sample however patient had no further diarrhea while in the emergency department. ____________________________________________  FINAL CLINICAL IMPRESSION(S) / ED DIAGNOSES  Final diagnoses:  Gastroenteritis     MEDICATIONS GIVEN DURING THIS VISIT:  Medications  ondansetron (ZOFRAN) injection 4 mg (4 mg Intravenous Given 02/15/17 0539)  sodium chloride 0.9 % bolus 1,000 mL (0 mLs Intravenous Stopped 02/15/17 0633)  potassium chloride (KLOR-CON) packet 40 mEq (40 mEq Oral Given 02/15/17 0631)     ED Discharge Orders    None       Note:  This document was prepared using Dragon voice recognition software and may include unintentional dictation errors.    Darci CurrentBrown, [place] N, MD 02/16/17 [phone]

## 2017-03-20 ENCOUNTER — Ambulatory Visit
Admission: RE | Admit: 2017-03-20 | Discharge: 2017-03-20 | Disposition: A | Discharge status: Home / Self Care | Payer: Medicare Other | Source: Ambulatory Visit | Attending: Internal Medicine | Admitting: Internal Medicine

## 2017-03-20 DIAGNOSIS — Z1231 Encounter for screening mammogram for malignant neoplasm of breast: Secondary | ICD-10-CM | POA: Diagnosis present

## 2017-03-22 ENCOUNTER — Other Ambulatory Visit: Payer: Self-pay | Admitting: Internal Medicine

## 2017-03-22 DIAGNOSIS — R928 Other abnormal and inconclusive findings on diagnostic imaging of breast: Secondary | ICD-10-CM

## 2017-03-29 ENCOUNTER — Ambulatory Visit
Admission: RE | Admit: 2017-03-29 | Discharge: 2017-03-29 | Disposition: A | Discharge status: Home / Self Care | Payer: Medicare Other | Source: Ambulatory Visit | Attending: Internal Medicine | Admitting: Internal Medicine

## 2017-03-29 DIAGNOSIS — N6489 Other specified disorders of breast: Secondary | ICD-10-CM | POA: Insufficient documentation

## 2017-03-29 DIAGNOSIS — R928 Other abnormal and inconclusive findings on diagnostic imaging of breast: Secondary | ICD-10-CM

## 2017-06-09 ENCOUNTER — Observation Stay
Admission: EM | Admit: 2017-06-09 | Discharge: 2017-06-10 | Disposition: A | Discharge status: Home / Self Care | Payer: Medicare Other | Attending: Internal Medicine | Admitting: Internal Medicine

## 2017-06-09 ENCOUNTER — Other Ambulatory Visit: Payer: Self-pay

## 2017-06-09 DIAGNOSIS — Z880 Allergy status to penicillin: Secondary | ICD-10-CM | POA: Insufficient documentation

## 2017-06-09 DIAGNOSIS — Z79890 Hormone replacement therapy (postmenopausal): Secondary | ICD-10-CM | POA: Insufficient documentation

## 2017-06-09 DIAGNOSIS — T783XXA Angioneurotic edema, initial encounter: Principal | ICD-10-CM

## 2017-06-09 DIAGNOSIS — Z79899 Other long term (current) drug therapy: Secondary | ICD-10-CM | POA: Insufficient documentation

## 2017-06-09 DIAGNOSIS — E890 Postprocedural hypothyroidism: Secondary | ICD-10-CM | POA: Insufficient documentation

## 2017-06-09 DIAGNOSIS — Z888 Allergy status to other drugs, medicaments and biological substances status: Secondary | ICD-10-CM | POA: Diagnosis not present

## 2017-06-09 DIAGNOSIS — T7840XA Allergy, unspecified, initial encounter: Secondary | ICD-10-CM | POA: Diagnosis present

## 2017-06-09 DIAGNOSIS — Z803 Family history of malignant neoplasm of breast: Secondary | ICD-10-CM | POA: Insufficient documentation

## 2017-06-09 DIAGNOSIS — I10 Essential (primary) hypertension: Secondary | ICD-10-CM | POA: Insufficient documentation

## 2017-06-09 HISTORY — DX: Failed or difficult intubation, initial encounter: T88.4XXA

## 2017-06-09 LAB — COMPREHENSIVE METABOLIC PANEL
ALBUMIN: 3.6 g/dL (ref 3.5–5.0)
ALT: 14 U/L (ref 14–54)
ANION GAP: 8 (ref 5–15)
AST: 23 U/L (ref 15–41)
Alkaline Phosphatase: 66 U/L (ref 38–126)
BUN: 21 mg/dL — ABNORMAL HIGH (ref 6–20)
CALCIUM: 8.7 mg/dL — AB (ref 8.9–10.3)
CO2: 27 mmol/L (ref 22–32)
Chloride: 104 mmol/L (ref 101–111)
Creatinine, Ser: 1.39 mg/dL — ABNORMAL HIGH (ref 0.44–1.00)
GFR calc non Af Amer: 35 mL/min — ABNORMAL LOW (ref >60)
GFR, EST AFRICAN AMERICAN: 41 mL/min — AB (ref >60)
GLUCOSE: 105 mg/dL — AB (ref 65–99)
POTASSIUM: 3.3 mmol/L — AB (ref 3.5–5.1)
SODIUM: 139 mmol/L (ref 135–145)
Total Bilirubin: 0.6 mg/dL (ref 0.3–1.2)
Total Protein: 6.8 g/dL (ref 6.5–8.1)

## 2017-06-09 LAB — CBC WITH DIFFERENTIAL/PLATELET
BASOS PCT: 1 %
Basophils Absolute: 0.1 10*3/uL (ref 0–0.1)
EOS PCT: 4 %
Eosinophils Absolute: 0.3 10*3/uL (ref 0–0.7)
HCT: 32.4 % — ABNORMAL LOW (ref 35.0–47.0)
Hemoglobin: 11.2 g/dL — ABNORMAL LOW (ref 12.0–16.0)
LYMPHS ABS: 2.5 10*3/uL (ref 1.0–3.6)
Lymphocytes Relative: 33 %
MCH: 32.2 pg (ref 26.0–34.0)
MCHC: 34.5 g/dL (ref 32.0–36.0)
MCV: 93.2 fL (ref 80.0–100.0)
MONOS PCT: 16 %
Monocytes Absolute: 1.2 10*3/uL — ABNORMAL HIGH (ref 0.2–0.9)
NEUTROS PCT: 46 %
Neutro Abs: 3.5 10*3/uL (ref 1.4–6.5)
Platelets: 266 10*3/uL (ref 150–440)
RBC: 3.48 MIL/uL — ABNORMAL LOW (ref 3.80–5.20)
RDW: 13.1 % (ref 11.5–14.5)
WBC: 7.6 10*3/uL (ref 3.6–11.0)

## 2017-06-09 LAB — TSH: TSH: 2.53 u[IU]/mL (ref 0.350–4.500)

## 2017-06-09 LAB — MAGNESIUM: Magnesium: 2 mg/dL (ref 1.7–2.4)

## 2017-06-09 MED ORDER — ACETAMINOPHEN 325 MG PO TABS
650.0000 mg | ORAL_TABLET | Freq: Four times a day (QID) | ORAL | Status: DC | PRN
  Administered 2017-06-09: 650 mg via ORAL
  Filled 2017-06-09: qty 2

## 2017-06-09 MED ORDER — ONDANSETRON HCL 4 MG PO TABS: 4.0000 mg | ORAL_TABLET | Freq: Four times a day (QID) | ORAL | Status: DC | PRN

## 2017-06-09 MED ORDER — SODIUM CHLORIDE 0.9 % IV SOLN
INTRAVENOUS | Status: DC
  Administered 2017-06-09 (×2): via INTRAVENOUS

## 2017-06-09 MED ORDER — ACETAMINOPHEN 650 MG RE SUPP: 650.0000 mg | Freq: Four times a day (QID) | RECTAL | Status: DC | PRN

## 2017-06-09 MED ORDER — RACEPINEPHRINE HCL 2.25 % IN NEBU
INHALATION_SOLUTION | RESPIRATORY_TRACT | Status: AC
  Filled 2017-06-09: qty 0.5

## 2017-06-09 MED ORDER — DOCUSATE SODIUM 100 MG PO CAPS
100.0000 mg | ORAL_CAPSULE | Freq: Two times a day (BID) | ORAL | Status: DC
  Administered 2017-06-09 – 2017-06-10 (×3): 100 mg via ORAL
  Filled 2017-06-09 (×3): qty 1

## 2017-06-09 MED ORDER — AMLODIPINE BESYLATE 5 MG PO TABS
5.0000 mg | ORAL_TABLET | Freq: Every day | ORAL | Status: DC
  Administered 2017-06-09 – 2017-06-10 (×2): 5 mg via ORAL
  Filled 2017-06-09 (×2): qty 1

## 2017-06-09 MED ORDER — METAXALONE 400 MG HALF TABLET
400.0000 mg | ORAL_TABLET | Freq: Two times a day (BID) | ORAL | Status: DC | PRN
  Filled 2017-06-09: qty 2

## 2017-06-09 MED ORDER — RACEPINEPHRINE HCL 2.25 % IN NEBU
0.5000 mL | INHALATION_SOLUTION | Freq: Once | RESPIRATORY_TRACT | Status: AC
  Administered 2017-06-09: 0.5 mL via RESPIRATORY_TRACT

## 2017-06-09 MED ORDER — ZOLPIDEM TARTRATE 5 MG PO TABS
5.0000 mg | ORAL_TABLET | Freq: Every evening | ORAL | Status: DC | PRN
  Administered 2017-06-09: 5 mg via ORAL
  Filled 2017-06-09: qty 1

## 2017-06-09 MED ORDER — SODIUM CHLORIDE 0.9 % IV BOLUS
1000.0000 mL | Freq: Once | INTRAVENOUS | Status: AC
  Administered 2017-06-09: 1000 mL via INTRAVENOUS

## 2017-06-09 MED ORDER — HEPARIN SODIUM (PORCINE) 5000 UNIT/ML IJ SOLN
5000.0000 [IU] | Freq: Three times a day (TID) | INTRAMUSCULAR | Status: DC
  Administered 2017-06-09 – 2017-06-10 (×3): 5000 [IU] via SUBCUTANEOUS
  Filled 2017-06-09 (×3): qty 1

## 2017-06-09 MED ORDER — LEVOTHYROXINE SODIUM 50 MCG PO TABS
75.0000 ug | ORAL_TABLET | Freq: Every day | ORAL | Status: DC
  Administered 2017-06-10: 75 ug via ORAL
  Filled 2017-06-09: qty 1

## 2017-06-09 MED ORDER — ONDANSETRON HCL 4 MG/2ML IJ SOLN: 4.0000 mg | Freq: Four times a day (QID) | INTRAMUSCULAR | Status: DC | PRN

## 2017-06-09 NOTE — ED Notes (Signed)
Per MD York CeriseForbach pt placed on 4L O2 at this time

## 2017-06-09 NOTE — ED Notes (Signed)
Pt sleeping comfortably at this time. Pt woken up and complains of no pain. Pt also states she feels a lot better and the swelling has gone down. Pt's blood pressure reading low. BP cuff readjusted and retaken.

## 2017-06-09 NOTE — H&P (Addendum)
Kristi Maldonado is an 77 y.o. female.   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  The patient reports that she had eaten a frozen lasagna meal approximately 2 hours prior to her tongue swelling and the sensation of tightening in her chest.  Approximately 1 hour before symptoms began the patient also had some ginger ale and saltines crackers.  None of these foods is new to her diet.  Notably, the patient had been on lisinopril for years.  EMS administered epinephrine which improved the patient's symptoms.  The patient continued to improve in the emergency department but due to her history of difficult airway and concern for angioedema the emergency department staff called the hospitalist service for observation.  Past Medical History:  Diagnosis Date  . Difficult airway for intubation    Patient told by anesthesiology that she should tell future providers to always use a pediatric ET tube due to narrow airway and prior surgical issues including scarring  . Hypertension   . Thyroid disease     Past Surgical History:  Procedure Laterality Date  . THYROID SURGERY      Family History  Problem Relation Age of Onset  . Breast cancer Maternal Aunt        two aunts  . Breast cancer Cousin    Social History:  reports that she has never smoked. She has never used smokeless tobacco. She reports that she does not drink alcohol or use drugs.  Allergies:  Allergies  Allergen Reactions  . Lisinopril   . Penicillins Rash and Other (See Comments)    Has patient had a PCN reaction causing immediate rash, facial/tongue/throat swelling, SOB or lightheadedness with hypotension: Unknown Has patient had a PCN reaction causing severe rash involving mucus membranes or skin necrosis: No Has patient had a PCN  reaction that required hospitalization: No Has patient had a PCN reaction occurring within the last 10 years: No If all of the above answers are "NO", then may proceed with Cephalosporin use.     Prior to Admission medications   Medication Sig Start Date End Date Taking? Authorizing Provider  levothyroxine (SYNTHROID, LEVOTHROID) 75 MCG tablet Take 75 mcg by mouth daily before breakfast.   Yes [provider]  metaxalone (SKELAXIN) 800 MG tablet Take 400-800 mg by mouth 2 (two) times daily as needed for pain. 03/12/17  Yes [provider]  ondansetron (ZOFRAN ODT) 4 MG disintegrating tablet Take 1 tablet (4 mg total) by mouth every 8 (eight) hours as needed for nausea or vomiting. Patient not taking: Reported on 06/09/2017 02/15/17   Gregor Hams, MD     Results for orders placed or performed during the hospital encounter of 06/09/17 (from the past 48 hour(s))  CBC with Differential/Platelet     Status: Abnormal   Collection Time: 06/09/17  1:02 AM  Result Value Ref Range   WBC 7.6 3.6 - 11.0 K/uL   RBC 3.48 (L) 3.80 - 5.20 MIL/uL   Hemoglobin 11.2 (L) 12.0 - 16.0 g/dL   HCT 32.4 (L) 35.0 - 47.0 %   MCV 93.2 80.0 - 100.0 fL   MCH 32.2 26.0 - 34.0 pg   MCHC 34.5 32.0 - 36.0 g/dL   RDW 13.1 11.5 - 14.5 %   Platelets 266 150 - 440 K/uL   Neutrophils Relative % 46 %   Neutro  Abs 3.5 1.4 - 6.5 K/uL   Lymphocytes Relative 33 %   Lymphs Abs 2.5 1.0 - 3.6 K/uL   Monocytes Relative 16 %   Monocytes Absolute 1.2 (H) 0.2 - 0.9 K/uL   Eosinophils Relative 4 %   Eosinophils Absolute 0.3 0 - 0.7 K/uL   Basophils Relative 1 %   Basophils Absolute 0.1 0 - 0.1 K/uL    Comment: Performed at [facility], [address] [place], [place] [address]  Comprehensive metabolic panel     Status: Abnormal   Collection Time: 06/09/17  1:02 AM  Result Value Ref Range   Sodium 139 135 - 145 mmol/L   Potassium 3.3 (L) 3.5 - 5.1 mmol/L   Chloride 104 101 - 111 mmol/L   CO2  27 22 - 32 mmol/L   Glucose, Bld 105 (H) 65 - 99 mg/dL   BUN 21 (H) 6 - 20 mg/dL   Creatinine, Ser 1.39 (H) 0.44 - 1.00 mg/dL   Calcium 8.7 (L) 8.9 - 10.3 mg/dL   Total Protein 6.8 6.5 - 8.1 g/dL   Albumin 3.6 3.5 - 5.0 g/dL   AST 23 15 - 41 U/L   ALT 14 14 - 54 U/L   Alkaline Phosphatase 66 38 - 126 U/L   Total Bilirubin 0.6 0.3 - 1.2 mg/dL   GFR calc non Af Amer 35 (L) >60 mL/min   GFR calc Af Amer 41 (L) >60 mL/min    Comment: (NOTE) The eGFR has been calculated using the CKD EPI equation. This calculation has not been validated in all clinical situations. eGFR's persistently <60 mL/min signify possible Chronic Kidney Disease.    Anion gap 8 5 - 15    Comment: Performed at [facility], [address] [place], [place] [address]  Magnesium     Status: None   Collection Time: 06/09/17  1:02 AM  Result Value Ref Range   Magnesium 2.0 1.7 - 2.4 mg/dL    Comment: Performed at [facility], [address] [place], [place] [address]   No results found.  Review of Systems  Constitutional: Negative for chills and fever.  HENT: Negative for sore throat and tinnitus.   Eyes: Negative for blurred vision and redness.  Respiratory: Negative for cough and shortness of breath.   Cardiovascular: Negative for chest pain, palpitations, orthopnea and PND.  Gastrointestinal: Negative for abdominal pain, diarrhea, nausea and vomiting.  Genitourinary: Negative for dysuria, frequency and urgency.  Musculoskeletal: Negative for joint pain and myalgias.  Skin: Negative for rash.       No lesions  Neurological: Negative for speech change, focal weakness and weakness.  Endo/Heme/Allergies: Does not bruise/bleed easily.       No temperature intolerance  Psychiatric/Behavioral: Negative for depression and suicidal ideas.    Blood pressure (!) 111/54, pulse 64, temperature (!) 97.4 F (36.3 C), temperature source Axillary, resp. rate 15, height _0  (1.6 m), weight 63.5 kg  (140 lb), SpO2 100 %. Physical Exam  Vitals reviewed. Constitutional: She is oriented to person, place, and time. She appears well-developed and well-nourished. No distress.  HENT:  Head: Normocephalic and atraumatic.  Mouth/Throat: Oropharynx is clear and moist.  Eyes: Pupils are equal, round, and reactive to light. Conjunctivae and EOM are normal.  Neck: Normal range of motion. Neck supple. No tracheal deviation present. No thyromegaly present.  Cardiovascular: Normal rate, regular rhythm and normal heart sounds. Exam reveals no gallop and no friction rub.  No murmur  heard. Respiratory: Effort normal and breath sounds normal.  GI: Soft. Bowel sounds are normal. She exhibits no distension. There is no tenderness.  Genitourinary:  Genitourinary Comments: Deferred  Musculoskeletal: Normal range of motion. She exhibits no edema.  Lymphadenopathy:    She has no cervical adenopathy.  Neurological: She is alert and oriented to person, place, and time. No cranial nerve deficit. She exhibits normal muscle tone.  Skin: Skin is warm and dry. No rash noted. No erythema.  Psychiatric: She has a normal mood and affect. Her behavior is normal. Judgment and thought content normal.     Assessment/Plan This is a 77 year old female admitted for allergic reaction. 1.  Allergic reaction: Potentially angioedema secondary to lisinopril.  Her initial diagnosis includes anaphylaxis but this is unlikely.  Mucosal swelling has improved.  Oxygen sats are normal.  The patient feels well.  Continue to observe. 2.  Hypertension: Controlled; hold lisinopril.  Continue to monitor blood pressure.  Labetalol as needed. 3.  Chronic kidney disease: Stage III; hydrate with intravenous fluid.  Avoid nephrotoxic agents. 4.  Hypothyroidism: Check TSH; continue Synthroid.  The patient has history of difficult airway following thyroidectomy.   5.  DVT prophylaxis: heparin 6.  GI prophylaxis: None The patient is a full code.   Time spent on admission orders and patient care approximately 45 minutes  Harrie Foreman, MD 06/09/2017, 5:34 AM

## 2017-06-09 NOTE — Progress Notes (Signed)
Patient seen and examined. Patient will need to be monitored overnight ACE inhibitor should be added to allergy list as this is most likely culprit. Add Norvasc in place for hypertension.

## 2017-06-09 NOTE — Progress Notes (Signed)
Family Meeting Note  Advance Directive:yes  Today a meeting took place with the Patient.    The following clinical team members were present during this meeting:MD  The following were discussed:Patient's diagnosis: , Allergic reaction likely to ACE inhibitor patient's progosis: > 12 months and Goals for treatment: Full Code  Additional follow-up to be provided: chaplain consult  Time spent during discussion:16 minutes  Karleigh Bunte, Patricia PesaSITAL, MD

## 2017-06-09 NOTE — ED Triage Notes (Addendum)
Pt comes via [facility] with c/o allergic reaction. Pt ate dinner and shortly after started having trouble swallowing and tongue swelling. Pt recently started Linsopril again. EMS gave pt 50 benadryl, 25 Pepcid bolus, and 125 solumedrol. Pt later complained of mid abdominal pain and EMS states she then had bilateral wheezing. Pt was then given 0.3 epi and 2.5 albuterol. SR on monitor, HR 55-60. Pt is alert and has swelling noted to tongue. Pt is able to communicate with MD.

## 2017-06-09 NOTE — ED Notes (Signed)
Spoke with MD Sheryle Hailiamond concerning pt's blood pressure, per MD Sheryle Hailiamond give patient liter bolus of fluids at this time

## 2017-06-09 NOTE — ED Provider Notes (Signed)
[facility] Emergency Department Provider Note  ____________________________________________   First MD Initiated Contact with Patient 06/09/17 0111     (approximate)  I have reviewed the triage vital signs and the nursing notes.   HISTORY  Chief Complaint Allergic Reaction  Level 5 caveat:  history/ROS limited by acute/critical illness  HPI Kristi Maldonado is a 77 y.o. female with medical history as listed below which notably includes prior airway difficulties status post thyroid surgery who presents by EMS for evaluation of swelling in her mouth and throat and difficulty breathing.  EMS reported and the patient confirmed that she ate a lasagna dinner tonight and about 30-60 minutes later started feeling like her tongue and throat were swelling.  At the time that she called 911 she states that they could barely understand her due to the amount of swelling and the degree to which her voice was muffled.  When EMS arrived they were very concerned and treated her appropriately and aggressively with epinephrine 0.3 mg intramuscular, Solu-Medrol 125 mg IV, famotidine 20 mg IV, Benadryl 50 mg IV, and an albuterol inhaler, which she was still using when she arrived to the emergency department.  Both the patient and the paramedics report that she developed wheezing in bilateral lungs after they arrived at the scene but that had resolved completely by the time they got to the emergency department after the use of albuterol.  The patient states that she is able to speak slightly more clearly but is having trouble swallowing and feels like she has more swelling in and below her tongue that she did previously.  She denies trouble breathing at this time.  She has not been ill recently and denies fever/chills, chest pain, nausea, vomiting, diarrhea, and abdominal pain.  She previously took losartan but was recently switched to lisinopril-HCTZ after a manufacturer recall of the losartan.   She has been on it for an extended period of time, however, and has never had a reaction.  She denies any prior allergic reactions to food or any other substances.  She has no atopic history.  Past Medical History:  Diagnosis Date  . Difficult airway for intubation    Patient told by anesthesiology that she should tell future providers to always use a pediatric ET tube due to narrow airway and prior surgical issues including scarring  . Hypertension   . Thyroid disease     Patient Active Problem List   Diagnosis Date Noted  . Allergic reaction 06/09/2017  . DIVERTICULITIS, COLON 02/05/2009  . IBS 02/05/2009    Past Surgical History:  Procedure Laterality Date  . THYROID SURGERY      Prior to Admission medications   Medication Sig Start Date End Date Taking? Authorizing Provider  levothyroxine (SYNTHROID, LEVOTHROID) 75 MCG tablet Take 75 mcg by mouth daily before breakfast.   Yes [provider]  metaxalone (SKELAXIN) 800 MG tablet Take 400-800 mg by mouth 2 (two) times daily as needed for pain. 03/12/17  Yes [provider]  ondansetron (ZOFRAN ODT) 4 MG disintegrating tablet Take 1 tablet (4 mg total) by mouth every 8 (eight) hours as needed for nausea or vomiting. Patient not taking: Reported on 06/09/2017 02/15/17   Darci Current, MD    Allergies Lisinopril and Penicillins  Family History  Problem Relation Age of Onset  . Breast cancer Maternal Aunt        two aunts  . Breast cancer Cousin     Social History Social  History   Tobacco Use  . Smoking status: Never Smoker  . Smokeless tobacco: Never Used  Substance Use Topics  . Alcohol use: No  . Drug use: No    Review of Systems Level 5 caveat:  history/ROS limited by acute/critical illness.  Pertinent negatives are listed in HPI ____________________________________________   PHYSICAL EXAM:  VITAL SIGNS: ED Triage Vitals  Enc Vitals Group     BP 06/09/17 0056 (!) 151/69     Pulse Rate  06/09/17 0056 63     Resp 06/09/17 0056 16     Temp 06/09/17 0102 (!) 97.4 F (36.3 C)     Temp Source 06/09/17 0102 Axillary     SpO2 06/09/17 0056 100 %     Weight 06/09/17 0059 63.5 kg (140 lb)     Height 06/09/17 0059 1.6 m (5\' 3" )     Head Circumference --      Peak Flow --      Pain Score 06/09/17 0105 0     Pain Loc --      Pain Edu? --      Excl. in GC? --     Constitutional: Alert and oriented. Moderate distress due to angioedema Eyes: Conjunctivae are normal.  Head: Atraumatic. Nose: No congestion/rhinnorhea. Mouth/Throat: Mucous membranes are moist.  Oropharynx non-erythematous.  Left side of tongue is severely swollen consistent with angioedema, both superiorly and at the tongue base.  Visualization of the posterior oropharynx is obscured significantly due to the degree of swelling in the tongue.  No visible dental infection or injury. Neck: No stridor.  No meningeal signs.  She has some brawny induration on the left side below her mandible. Cardiovascular: Normal rate, regular rhythm. Good peripheral circulation. Grossly normal heart sounds. Respiratory: Normal respiratory effort.  No retractions. Lungs CTAB with no wheezing status post albuterol Gastrointestinal: Soft and nontender. No distention.  Musculoskeletal: No lower extremity tenderness nor edema. No gross deformities of extremities. Neurologic:  Normal speech and language. No gross focal neurologic deficits are appreciated.  Skin:  Skin is warm, dry and intact. No rash noted. Psychiatric: Mood and affect are normal. Speech and behavior are normal.  ____________________________________________   LABS (all labs ordered are listed, but only abnormal results are displayed)  Labs Reviewed  CBC WITH DIFFERENTIAL/PLATELET - Abnormal; Notable for the following components:      Result Value   RBC 3.48 (*)    Hemoglobin 11.2 (*)    HCT 32.4 (*)    Monocytes Absolute 1.2 (*)    All other components within normal  limits  COMPREHENSIVE METABOLIC PANEL - Abnormal; Notable for the following components:   Potassium 3.3 (*)    Glucose, Bld 105 (*)    BUN 21 (*)    Creatinine, Ser 1.39 (*)    Calcium 8.7 (*)    GFR calc non Af Amer 35 (*)    GFR calc Af Amer 41 (*)    All other components within normal limits  MAGNESIUM   ____________________________________________  EKG  ED ECG REPORT I, Loleta Rose, the attending physician, personally viewed and interpreted this ECG.  Date: 06/09/2017 EKG Time: 01:03 Rate: 61 Rhythm: normal sinus rhythm QRS Axis: normal Intervals: normal ST/T Wave abnormalities: normal Narrative Interpretation: no evidence of acute ischemia  ____________________________________________  RADIOLOGY   ED MD interpretation: No indication for imaging  Official radiology report(s): No results found.  ____________________________________________   PROCEDURES  Critical Care performed: Yes, see critical care procedure note(s)  Procedure(s) performed:   .Critical Care Performed by: Loleta Rose, MD Authorized by: Loleta Rose, MD   Critical care provider statement:    Critical care time (minutes):  30   Critical care time was exclusive of:  Separately billable procedures and treating other patients   Critical care was necessary to treat or prevent imminent or life-threatening deterioration of the following conditions:  Respiratory failure   Critical care was time spent personally by me on the following activities:  Development of treatment plan with patient or surrogate, discussions with consultants, evaluation of patient's response to treatment, examination of patient, obtaining history from patient or surrogate, ordering and performing treatments and interventions, ordering and review of laboratory studies, ordering and review of radiographic studies, pulse oximetry, re-evaluation of patient's condition and review of old  charts     ____________________________________________   INITIAL IMPRESSION / ASSESSMENT AND PLAN / ED COURSE  As part of my medical decision making, I reviewed the following data within the electronic MEDICAL RECORD NUMBER Nursing notes reviewed and incorporated, Labs reviewed , EKG interpreted , Old chart reviewed and Discussed with admitting physician     Due to the acuity of the situation I was not able to document time stamped notes.    In short, I saw the patient as she arrived by EMS.  When she arrived she had a severely muffled voice and needed to sit up straight and she had to forcibly swallow in order to be able to handle her secretions.  She was able to communicate to me that she had been told in the past by anesthesiology that she needs to tell future providers that she requires a pediatric endotracheal tube due to prior airway complications including scarring, presumably during her thyroid surgery.  Differential diagnosis includes bradykinin mediated angioedema from her chronic lisinopril, anaphylaxis from something that she ate, or infectious process secondary to a dental source.  I strongly feel that her symptoms are most consistent with angioedema from lisinopril, although it is possible that this was an allergic reaction but she is a 77 year old woman who has never had an allergic reaction to anything she ate or 2 which she has been exposed.  She had been on losartan for an extended period of time but was relatively recently changed to lisinopril due to a manufacturer recall of the losartan, and I do believe that this is the culprit.  I have explained this to her and she understands.  I addressed her angioedema and airway compromise with a racemic epinephrine nebulizer treatment while we brought in all the emergency airway equipment that may be necessary.  Fiberoptic intubating scope is at the bedside along with a glide scope and direct laryngoscope as well as RSI medications.  I  also marked the spot on her anterior neck where I would perform a cricothyrotomy if necessary.  I explained everything I was doing to her and verbally consented her to do what was necessary to maintain her airway.  I reassessed her multiple times over the course of about 30 minutes.  Fortunately she improved slightly but noticeably.  Her voice became more clear, she was able to swallow easier, and she was able to show me that she could move her tongue side to side which she could not do previously.  Even though the left side of her tongue remains markedly enlarged from baseline, I can now see the back of her oropharynx if she tries to depress her tongue when she opens her mouth.  She is not in any distress as of about 1:50am.  She appears to have stabilized and is appropriate for hospitalist admission for further observation and monitoring of her angioedema.  All of the airway equipment remains in the room.  I called and spoke by phone with Dr. Anne HahnWillis with the hospitalist service at about 1:55 AM and explained the situation.  He will come evaluate the patient in person and admit for further treatment and management.  Clinical Course as of Jun 09 652  Sat Jun 09, 2017  0207 Patient remains stable, no worse, no better. Tolerating secretions, breathing comfortably, no stridor nor wheezing.   [CF]  0230 Patient is speaking more clearly.  Tongue swelling is gone down further but is still present.  Awaiting admission.   [CF]    Clinical Course User Index [CF] Loleta RoseForbach, Sammi Stolarz, MD    ____________________________________________  FINAL CLINICAL IMPRESSION(S) / ED DIAGNOSES  Final diagnoses:  Angioedema, initial encounter     MEDICATIONS GIVEN DURING THIS VISIT:  Medications  Racepinephrine HCl 2.25 % nebulizer solution 0.5 mL (0.5 mLs Nebulization Given 06/09/17 0101)     ED Discharge Orders    None       Note:  This document was prepared using Dragon voice recognition software and may  include unintentional dictation errors.    Loleta RoseForbach, Reise Gladney, MD 06/09/17 [phone]

## 2017-06-09 NOTE — ED Notes (Signed)
ED Provider at bedside. 

## 2017-06-09 NOTE — Progress Notes (Signed)
Patient requested information on AD. After conversation, the Chaplain learned that the patient has a pre-existing HCPOA and LW. These documents have not been filed at [facility]. Chaplain discussed the process for including the documents. Patient [facility] an [facility] and the Chaplain complied. After the explanation, Chaplain reminded to offer active listening and pastoral presence. Follow-up as requested.

## 2017-06-09 NOTE — Progress Notes (Signed)
Chaplain served Communion to the patient and her friend after an explanation of the meaning and purpose of the [employer]. Both people were grateful for the ritual.

## 2017-06-10 DIAGNOSIS — T783XXA Angioneurotic edema, initial encounter: Secondary | ICD-10-CM | POA: Diagnosis not present

## 2017-06-10 MED ORDER — AMLODIPINE BESYLATE 5 MG PO TABS: 5.0000 mg | ORAL_TABLET | Freq: Every day | ORAL | 0 refills | Status: DC

## 2017-06-10 NOTE — Progress Notes (Signed)
IVs and tele removed from patient. Discharge instructions given to paint along with hard copy prescription. Verbalized understanding. No distress at this time. Friend will transport patient home.

## 2017-06-10 NOTE — Discharge Summary (Signed)
[facility] -  at [facility]   PATIENT NAME: Kristi Maldonado    MR#:  [id number]  DATE OF BIRTH:  1940-12-04  DATE OF ADMISSION:  06/09/2017 ADMITTING PHYSICIAN: Arnaldo NatalMichael S Diamond, MD  DATE OF DISCHARGE: 06/10/2017  PRIMARY CARE PHYSICIAN: Leotis ShamesSingh, Jasmine, MD    ADMISSION DIAGNOSIS:  Angioedema, initial encounter [T78.3XXA]  DISCHARGE DIAGNOSIS:  Active Problems:   Allergic reaction   SECONDARY DIAGNOSIS:   Past Medical History:  Diagnosis Date  . Difficult airway for intubation    Patient told by anesthesiology that she should tell future providers to always use a pediatric ET tube due to narrow airway and prior surgical issues including scarring  . Hypertension   . Thyroid disease     HOSPITAL COURSE:   77 year old female with history of essential hypertension who presented with swelling of her tongue.  1.  Angioedema due to ACE inhibitor.  ACE inhibitor has been added to allergy list. Her symptoms have improved.  She is tolerating a diet without any issues.   2.  Essential hypertension: Ace inhibitors have been added to allergy list.  She is started on Norvasc.  She will follow-up with her primary care physician.  2.  Hypothyroidism: Continue Synthroid DISCHARGE CONDITIONS AND DIET:   Stable for discharge on heart healthy diet  CONSULTS OBTAINED:    DRUG ALLERGIES:   Allergies  Allergen Reactions  . Lisinopril Anaphylaxis  . Penicillins Rash and Other (See Comments)    Has patient had a PCN reaction causing immediate rash, facial/tongue/throat swelling, SOB or lightheadedness with hypotension: Unknown Has patient had a PCN reaction causing severe rash involving mucus membranes or skin necrosis: No Has patient had a PCN reaction that required hospitalization: No Has patient had a PCN reaction occurring within the last 10 years: No If all of the above answers are "NO", then may proceed with Cephalosporin use.     DISCHARGE MEDICATIONS:    Allergies as of 06/10/2017      Reactions   Lisinopril Anaphylaxis   Penicillins Rash, Other (See Comments)   Has patient had a PCN reaction causing immediate rash, facial/tongue/throat swelling, SOB or lightheadedness with hypotension: Unknown Has patient had a PCN reaction causing severe rash involving mucus membranes or skin necrosis: No Has patient had a PCN reaction that required hospitalization: No Has patient had a PCN reaction occurring within the last 10 years: No If all of the above answers are "NO", then may proceed with Cephalosporin use.      Medication List    STOP taking these medications   ondansetron 4 MG disintegrating tablet Commonly known as:  ZOFRAN ODT     TAKE these medications   amLODipine 5 MG tablet Commonly known as:  NORVASC Take 1 tablet (5 mg total) by mouth daily. Start taking on:  06/11/2017   levothyroxine 75 MCG tablet Commonly known as:  SYNTHROID, LEVOTHROID Take 75 mcg by mouth daily before breakfast.   metaxalone 800 MG tablet Commonly known as:  SKELAXIN Take 400-800 mg by mouth 2 (two) times daily as needed for pain.         Today   CHIEF COMPLAINT:   Doing well without any symptoms this morning.  Tolerating diet well.   VITAL SIGNS:  Blood pressure (!) 130/59, pulse (!) 55, temperature 97.9 F (36.6 C), temperature source Oral, resp. rate 16, height 5\' 3"  (1.6 m), weight 62.1 kg (137 lb), SpO2 99 %.   REVIEW OF SYSTEMS:  Review of  Systems  Constitutional: Negative.  Negative for chills, fever and malaise/fatigue.  HENT: Negative.  Negative for ear discharge, ear pain, hearing loss, nosebleeds and sore throat.   Eyes: Negative.  Negative for blurred vision and pain.  Respiratory: Negative.  Negative for cough, hemoptysis, shortness of breath and wheezing.   Cardiovascular: Negative.  Negative for chest pain, palpitations and leg swelling.  Gastrointestinal: Negative.  Negative for abdominal pain, blood in stool,  diarrhea, nausea and vomiting.  Genitourinary: Negative.  Negative for dysuria.  Musculoskeletal: Negative.  Negative for back pain.  Skin: Negative.   Neurological: Negative for dizziness, tremors, speech change, focal weakness, seizures and headaches.  Endo/Heme/Allergies: Negative.  Does not bruise/bleed easily.  Psychiatric/Behavioral: Negative.  Negative for depression, hallucinations and suicidal ideas.     PHYSICAL EXAMINATION:  GENERAL:  77 y.o.-year-old patient lying in the bed with no acute distress.  NECK:  Supple, no jugular venous distention. No thyroid enlargement, no tenderness.  LUNGS: Normal breath sounds bilaterally, no wheezing, rales,rhonchi  No use of accessory muscles of respiration.  CARDIOVASCULAR: S1, S2 normal. No murmurs, rubs, or gallops.  ABDOMEN: Soft, non-tender, non-distended. Bowel sounds present. No organomegaly or mass.  EXTREMITIES: No pedal edema, cyanosis, or clubbing.  PSYCHIATRIC: The patient is alert and oriented x 3.  SKIN: No obvious rash, lesion, or ulcer.   DATA REVIEW:   CBC Recent Labs  Lab 06/09/17 0102  WBC 7.6  HGB 11.2*  HCT 32.4*  PLT 266    Chemistries  Recent Labs  Lab 06/09/17 0102  NA 139  K 3.3*  CL 104  CO2 27  GLUCOSE 105*  BUN 21*  CREATININE 1.39*  CALCIUM 8.7*  MG 2.0  AST 23  ALT 14  ALKPHOS 66  BILITOT 0.6    Cardiac Enzymes No results for input(s): TROPONINI in the last 168 hours.  Microbiology Results  @MICRORSLT48 @  RADIOLOGY:  No results found.    Allergies as of 06/10/2017      Reactions   Lisinopril Anaphylaxis   Penicillins Rash, Other (See Comments)   Has patient had a PCN reaction causing immediate rash, facial/tongue/throat swelling, SOB or lightheadedness with hypotension: Unknown Has patient had a PCN reaction causing severe rash involving mucus membranes or skin necrosis: No Has patient had a PCN reaction that required hospitalization: No Has patient had a PCN reaction  occurring within the last 10 years: No If all of the above answers are "NO", then may proceed with Cephalosporin use.      Medication List    STOP taking these medications   ondansetron 4 MG disintegrating tablet Commonly known as:  ZOFRAN ODT     TAKE these medications   amLODipine 5 MG tablet Commonly known as:  NORVASC Take 1 tablet (5 mg total) by mouth daily. Start taking on:  06/11/2017   levothyroxine 75 MCG tablet Commonly known as:  SYNTHROID, LEVOTHROID Take 75 mcg by mouth daily before breakfast.   metaxalone 800 MG tablet Commonly known as:  SKELAXIN Take 400-800 mg by mouth 2 (two) times daily as needed for pain.           Management plans discussed with the patient and she is in agreement. Stable for discharge home  Patient should follow up with pcp  CODE STATUS:     Code Status Orders  (From admission, onward)        Start     Ordered   06/09/17 0941  Full code  Continuous  06/09/17 0940    Code Status History    This patient has a current code status but no historical code status.    Advance Directive Documentation     Most Recent Value  Type of Advance Directive  Living will, Healthcare Power of Attorney  Pre-existing out of facility DNR order (yellow form or pink MOST form)  -  "MOST" Form in Place?  -      TOTAL TIME TAKING CARE OF THIS PATIENT: 38 minutes.    Note: This dictation was prepared with Dragon dictation along with smaller phrase technology. Any transcriptional errors that result from this process are unintentional.  Renate Danh M.D on 06/10/2017 at 8:14 AM  Between 7am to 6pm - Pager - [phone] After 6pm go to [email or web address] - Social research officer, government  [facility]  Office  [phone]  CC: Primary care physician; Leotis Shames, MD

## 2017-06-10 NOTE — Care Management Obs Status (Signed)
MEDICARE OBSERVATION STATUS NOTIFICATION   Patient Details  Name: Kristi Maldonado MRN: [id number] Date of Birth: June 15, 1940   Medicare Observation Status Notification Given:  Yes    Eber HongGreene, Shakevia Sarris R, RN 06/10/2017, 8:29 AM

## 2017-07-09 ENCOUNTER — Other Ambulatory Visit: Payer: Self-pay | Admitting: Internal Medicine

## 2017-07-09 DIAGNOSIS — M5416 Radiculopathy, lumbar region: Secondary | ICD-10-CM

## 2017-07-25 ENCOUNTER — Ambulatory Visit
Admission: RE | Admit: 2017-07-25 | Discharge: 2017-07-25 | Disposition: A | Discharge status: Home / Self Care | Payer: Medicare Other | Source: Ambulatory Visit | Attending: Internal Medicine | Admitting: Internal Medicine

## 2017-07-25 DIAGNOSIS — M4805 Spinal stenosis, thoracolumbar region: Secondary | ICD-10-CM | POA: Insufficient documentation

## 2017-07-25 DIAGNOSIS — M4186 Other forms of scoliosis, lumbar region: Secondary | ICD-10-CM | POA: Insufficient documentation

## 2017-07-25 DIAGNOSIS — M5416 Radiculopathy, lumbar region: Secondary | ICD-10-CM | POA: Diagnosis not present

## 2017-07-25 DIAGNOSIS — M48061 Spinal stenosis, lumbar region without neurogenic claudication: Secondary | ICD-10-CM | POA: Insufficient documentation

## 2017-08-02 DIAGNOSIS — M5416 Radiculopathy, lumbar region: Secondary | ICD-10-CM | POA: Insufficient documentation

## 2017-08-13 ENCOUNTER — Other Ambulatory Visit: Payer: Self-pay

## 2017-08-13 ENCOUNTER — Emergency Department: Payer: Medicare Other

## 2017-08-13 ENCOUNTER — Emergency Department
Admission: EM | Admit: 2017-08-13 | Discharge: 2017-08-13 | Disposition: A | Discharge status: Home / Self Care | Payer: Medicare Other | Attending: Student in an Organized Health Care Education/Training Program | Admitting: Student in an Organized Health Care Education/Training Program

## 2017-08-13 DIAGNOSIS — I10 Essential (primary) hypertension: Secondary | ICD-10-CM | POA: Insufficient documentation

## 2017-08-13 DIAGNOSIS — R0789 Other chest pain: Secondary | ICD-10-CM | POA: Diagnosis not present

## 2017-08-13 DIAGNOSIS — Z79899 Other long term (current) drug therapy: Secondary | ICD-10-CM | POA: Diagnosis not present

## 2017-08-13 DIAGNOSIS — R079 Chest pain, unspecified: Secondary | ICD-10-CM | POA: Diagnosis present

## 2017-08-13 LAB — CBC
HEMATOCRIT: 35.7 % (ref 35.0–47.0)
HEMOGLOBIN: 12.1 g/dL (ref 12.0–16.0)
MCH: 32.1 pg (ref 26.0–34.0)
MCHC: 33.8 g/dL (ref 32.0–36.0)
MCV: 95 fL (ref 80.0–100.0)
Platelets: 272 10*3/uL (ref 150–440)
RBC: 3.75 MIL/uL — AB (ref 3.80–5.20)
RDW: 14.4 % (ref 11.5–14.5)
WBC: 7.8 10*3/uL (ref 3.6–11.0)

## 2017-08-13 LAB — BASIC METABOLIC PANEL
ANION GAP: 10 (ref 5–15)
BUN: 17 mg/dL (ref 6–20)
CALCIUM: 8.9 mg/dL (ref 8.9–10.3)
CO2: 25 mmol/L (ref 22–32)
Chloride: 102 mmol/L (ref 101–111)
Creatinine, Ser: 1.16 mg/dL — ABNORMAL HIGH (ref 0.44–1.00)
GFR, EST AFRICAN AMERICAN: 51 mL/min — AB (ref >60)
GFR, EST NON AFRICAN AMERICAN: 44 mL/min — AB (ref >60)
GLUCOSE: 110 mg/dL — AB (ref 65–99)
POTASSIUM: 3.7 mmol/L (ref 3.5–5.1)
Sodium: 137 mmol/L (ref 135–145)

## 2017-08-13 LAB — TROPONIN I
TROPONIN I: 0.03 ng/mL — AB (ref <0.03)
TROPONIN I: 0.03 ng/mL — AB (ref <0.03)

## 2017-08-13 MED ORDER — ACETAMINOPHEN 500 MG PO TABS
1000.0000 mg | ORAL_TABLET | Freq: Once | ORAL | Status: AC
  Administered 2017-08-13: 1000 mg via ORAL
  Filled 2017-08-13: qty 2

## 2017-08-13 MED ORDER — TRAMADOL HCL 50 MG PO TABS
50.0000 mg | ORAL_TABLET | Freq: Once | ORAL | Status: DC
  Filled 2017-08-13: qty 1

## 2017-08-13 NOTE — ED Triage Notes (Signed)
Pt arrive to ED c/o of chest pressure/tightness under L breast. States started in middle of night, thought was heart burn. Denies N&V, diaphoresis, dizziness.   Alert, oriented, ambulatory. No distress noted.

## 2017-08-13 NOTE — ED Provider Notes (Signed)
[facility] Emergency Department Provider Note    First MD Initiated Contact with Patient 08/13/17 1656     (approximate)  I have reviewed the triage vital signs and the nursing notes.   HISTORY  Chief Complaint Chest Pain    HPI Kristi Maldonado is a 77 y.o. female with a history of hypertension presents the ER with midsternal nonradiating chest pain that started in the middle at night around midnight.  Lasted several hours and described as pressure.  Denied any pain radiating up to her jaw or ripping or tearing through to her back.  There is no associated diaphoresis or nausea.  Thought that it was just indigestion but is still having some discomfort today.  It did get worse when she was laying on her left side with palpation of the chest.  No change with exertion.  Is never had pain like this before.  Denies any recent heavy lifting, fevers cough or shortness of breath.  No recent changes to her medications.    Past Medical History:  Diagnosis Date  . Difficult airway for intubation    Patient told by anesthesiology that she should tell future providers to always use a pediatric ET tube due to narrow airway and prior surgical issues including scarring  . Hypertension   . Thyroid disease    Family History  Problem Relation Age of Onset  . Breast cancer Maternal Aunt        two aunts  . Breast cancer Cousin    Past Surgical History:  Procedure Laterality Date  . SALIVARY GLAND SURGERY    . THYROID SURGERY     Patient Active Problem List   Diagnosis Date Noted  . Allergic reaction 06/09/2017  . DIVERTICULITIS, COLON 02/05/2009  . IBS 02/05/2009      Prior to Admission medications   Medication Sig Start Date End Date Taking? Authorizing Provider  amLODipine (NORVASC) 5 MG tablet Take 1 tablet (5 mg total) by mouth daily. 06/11/17   Adrian Saran, MD  levothyroxine (SYNTHROID, LEVOTHROID) 75 MCG tablet Take 75 mcg by mouth daily before breakfast.     [provider]  metaxalone (SKELAXIN) 800 MG tablet Take 400-800 mg by mouth 2 (two) times daily as needed for pain. 03/12/17   [provider]    Allergies Lisinopril and Penicillins    Social History Social History   Tobacco Use  . Smoking status: Never Smoker  . Smokeless tobacco: Never Used  Substance Use Topics  . Alcohol use: No  . Drug use: No    Review of Systems Patient denies headaches, rhinorrhea, blurry vision, numbness, shortness of breath, chest pain, edema, cough, abdominal pain, nausea, vomiting, diarrhea, dysuria, fevers, rashes or hallucinations unless otherwise stated above in HPI. ____________________________________________   PHYSICAL EXAM:  VITAL SIGNS: Vitals:   08/13/17 1715 08/13/17 1817  BP:  (!) 165/71  Pulse: (!) 54 (!) 55  Resp: 16 18  Temp:    SpO2: 100% 98%    Constitutional: Alert and oriented.  Eyes: Conjunctivae are normal.  Head: Atraumatic. Nose: No congestion/rhinnorhea. Mouth/Throat: Mucous membranes are moist.   Neck: No stridor. Painless ROM.  Cardiovascular: Normal rate, regular rhythm. Grossly normal heart sounds.  Good peripheral circulation. Respiratory: Normal respiratory effort.  No retractions. Lungs CTAB. Gastrointestinal: Soft and nontender. No distention. No abdominal bruits. No CVA tenderness. Genitourinary:  Musculoskeletal: No lower extremity tenderness nor edema.  No joint effusions. Neurologic:  Normal speech and language. No gross focal  neurologic deficits are appreciated. No facial droop Skin:  Skin is warm, dry and intact. No rash noted. Psychiatric: Mood and affect are normal. Speech and behavior are normal.  ____________________________________________   LABS (all labs ordered are listed, but only abnormal results are displayed)  Results for orders placed or performed during the hospital encounter of 08/13/17 (from the past 24 hour(s))  Basic metabolic panel     Status: Abnormal    Collection Time: 08/13/17  3:12 PM  Result Value Ref Range   Sodium 137 135 - 145 mmol/L   Potassium 3.7 3.5 - 5.1 mmol/L   Chloride 102 101 - 111 mmol/L   CO2 25 22 - 32 mmol/L   Glucose, Bld 110 (H) 65 - 99 mg/dL   BUN 17 6 - 20 mg/dL   Creatinine, Ser 1.61 (H) 0.44 - 1.00 mg/dL   Calcium 8.9 8.9 - 09.6 mg/dL   GFR calc non Af Amer 44 (L) >60 mL/min   GFR calc Af Amer 51 (L) >60 mL/min   Anion gap 10 5 - 15  CBC     Status: Abnormal   Collection Time: 08/13/17  3:12 PM  Result Value Ref Range   WBC 7.8 3.6 - 11.0 K/uL   RBC 3.75 (L) 3.80 - 5.20 MIL/uL   Hemoglobin 12.1 12.0 - 16.0 g/dL   HCT 04.5 40.9 - 81.1 %   MCV 95.0 80.0 - 100.0 fL   MCH 32.1 26.0 - 34.0 pg   MCHC 33.8 32.0 - 36.0 g/dL   RDW 91.4 78.2 - 95.6 %   Platelets 272 150 - 440 K/uL  Troponin I     Status: Abnormal   Collection Time: 08/13/17  3:12 PM  Result Value Ref Range   Troponin I 0.03 (HH) <0.03 ng/mL  Troponin I     Status: Abnormal   Collection Time: 08/13/17  6:00 PM  Result Value Ref Range   Troponin I 0.03 (HH) <0.03 ng/mL   ____________________________________________  EKG My review and personal interpretation at Time: 15:09   Indication: chest pain  Rate: 60  Rhythm: sinus Axis: normal  Other: normal intervals, no stemi, unchanged from previous ____________________________________________  RADIOLOGY  I personally reviewed all radiographic images ordered to evaluate for the above acute complaints and reviewed radiology reports and findings.  These findings were personally discussed with the patient.  Please see medical record for radiology report.  ____________________________________________   PROCEDURES  Procedure(s) performed:  Procedures    Critical Care performed: no ____________________________________________   INITIAL IMPRESSION / ASSESSMENT AND PLAN / ED COURSE  Pertinent labs & imaging results that were available during my care of the patient were reviewed by me and  considered in my medical decision making (see chart for details).   DDX: ACS, pericarditis, esophagitis, boerhaaves, pe, dissection, pna, bronchitis, costochondritis   Kristi Maldonado is a 77 y.o. who presents to the ED with symptoms as described above.  Symptoms started around midnight last night.  Currently having minimal pain it seems very reproducible muscular skeletal in nature but based on her age and risk factors will further risk stratify with serial enzymes.  This not clinically consistent with dissection or PE.  Her abdominal exam is soft benign.  Clinical Course as of Aug 13 1844  Mon Aug 13, 2017  1837 Repeat trop is unchanged and equivocal particular given her CKD.  Pain remains reproducible.  No ectopy or evidence of dysrhythmia on telemetry monitor.  This point do believe patient  stable and appropriate for follow-up and cardiology clinic.  I spoke with Dr. Lady GaryFath who kindly agrees to see patient in follow-up.  Discussed signs and symptoms for which the patient should return to the Er.  Have discussed with the patient and available family all diagnostics and treatments performed thus far and all questions were answered to the best of my ability. The patient demonstrates understanding and agreement with plan.    [PR]    Clinical Course User Index [PR] Willy Eddyobinson, Clyde Upshaw, MD     As part of my medical decision making, I reviewed the following data within the electronic MEDICAL RECORD NUMBER Nursing notes reviewed and incorporated, Labs reviewed, notes from prior ED visits.   ____________________________________________   FINAL CLINICAL IMPRESSION(S) / ED DIAGNOSES  Final diagnoses:  Atypical chest pain      NEW MEDICATIONS STARTED DURING THIS VISIT:  New Prescriptions   No medications on file     Note:  This document was prepared using Dragon voice recognition software and may include unintentional dictation errors.    Willy Eddyobinson, Mahati Vajda, MD 08/13/17 [phone]

## 2017-08-13 NOTE — ED Notes (Signed)
Pt states chest pain started last night at midnight and was crushing chest pain of "9/10." Pt states this morning her pain has started to go down and now it is "4/10."

## 2017-11-27 ENCOUNTER — Ambulatory Visit: Payer: Self-pay

## 2018-02-20 DIAGNOSIS — R3129 Other microscopic hematuria: Secondary | ICD-10-CM | POA: Insufficient documentation

## 2018-02-20 DIAGNOSIS — K219 Gastro-esophageal reflux disease without esophagitis: Secondary | ICD-10-CM | POA: Insufficient documentation

## 2018-04-09 ENCOUNTER — Other Ambulatory Visit: Payer: Self-pay | Admitting: Internal Medicine

## 2018-04-09 DIAGNOSIS — Z1231 Encounter for screening mammogram for malignant neoplasm of breast: Secondary | ICD-10-CM

## 2018-04-26 ENCOUNTER — Ambulatory Visit
Admission: RE | Admit: 2018-04-26 | Discharge: 2018-04-26 | Disposition: A | Discharge status: Home / Self Care | Payer: Medicare Other | Source: Ambulatory Visit | Attending: Internal Medicine | Admitting: Internal Medicine

## 2018-04-26 DIAGNOSIS — Z1231 Encounter for screening mammogram for malignant neoplasm of breast: Secondary | ICD-10-CM | POA: Diagnosis present

## 2018-07-23 DIAGNOSIS — Z8249 Family history of ischemic heart disease and other diseases of the circulatory system: Secondary | ICD-10-CM | POA: Insufficient documentation

## 2018-07-23 DIAGNOSIS — Z0000 Encounter for general adult medical examination without abnormal findings: Secondary | ICD-10-CM | POA: Insufficient documentation

## 2018-07-24 ENCOUNTER — Encounter: Payer: Self-pay | Admitting: Physical Therapy

## 2018-07-24 ENCOUNTER — Ambulatory Visit: Payer: Medicare Other | Attending: Obstetrics and Gynecology | Admitting: Physical Therapy

## 2018-07-24 ENCOUNTER — Other Ambulatory Visit: Payer: Self-pay

## 2018-07-24 DIAGNOSIS — R278 Other lack of coordination: Secondary | ICD-10-CM | POA: Insufficient documentation

## 2018-07-24 DIAGNOSIS — M62838 Other muscle spasm: Secondary | ICD-10-CM | POA: Diagnosis not present

## 2018-07-24 DIAGNOSIS — M5442 Lumbago with sciatica, left side: Secondary | ICD-10-CM | POA: Insufficient documentation

## 2018-07-24 DIAGNOSIS — R2689 Other abnormalities of gait and mobility: Secondary | ICD-10-CM | POA: Diagnosis present

## 2018-07-24 DIAGNOSIS — M533 Sacrococcygeal disorders, not elsewhere classified: Secondary | ICD-10-CM | POA: Diagnosis not present

## 2018-07-24 DIAGNOSIS — G8929 Other chronic pain: Secondary | ICD-10-CM

## 2018-07-24 NOTE — Therapy (Signed)
[facility] MAIN [facility] SERVICES [address] [place], [place], [address] Phone: [phone]   Fax:  [phone]  Physical Therapy Evaluation  Patient Details  Name: Champ[place] Wangelin MRN: [id number] Date of Birth: 02/13/1941 Referring Provider (PT): Moishe Spice Date: 07/24/2018    Past Medical History:  Diagnosis Date  . Difficult airway for intubation    Patient told by anesthesiology that she should tell future providers to always use a pediatric ET tube due to narrow airway and prior surgical issues including scarring  . Hypertension   . Thyroid disease     Past Surgical History:  Procedure Laterality Date  . SALIVARY GLAND SURGERY    . THYROID SURGERY      There were no vitals filed for this visit.   Subjective Assessment - 07/24/18 1111    Subjective 1) Pt has had urge for the past 2-3 months.  Pt goes every 2-3 hours.  Emptying urine every time.  Nothing triggers urge.     Pt can not make it to the toilet without leakage only in the morning.  Across less than half, she feels pressure that makes it hard to initiate urination .    2)  Pt has bowel movements every 2-3 days. Takes Miralax.  Stool Type 3 75% of the time  Type 4 25%.  Daily fluid intake:40 fl oz, no coffee, decaf tea 4 cups,  juices 12 oz.  Long Hx of constipation, straining. Pt is sometimes afriad to have a BM due to straining and hemorrhoids.    3)  CLBP for 5 years. Pt has had injections which have helped with her pain.  Currently pain 3/10.  Triggers to 7/10 : lifting 25 lb above floor ( vaccuum cleaner) and vaccuuming.  No injuries causing LBP.  Had Hx of radiating down B leg, one at a time, to back of knee.    4) prolapse: " I feel like something is in my panties. "  No pessary fitted yet.         Pertinent History  no children, tubal ligation surgery.     Patient Stated Goals  get bladder back in place          [facility] PT Assessment - 07/24/18 1141      Assessment   Medical Diagnosis  OAB     Referring Provider (PT)  Dalbert Garnet       Precautions   Precautions  None      Restrictions   Weight Bearing Restrictions  No      Balance Screen   Has the patient fallen in the past 6 months  No      Strength   Overall Strength Comments  BLE 4+/5 , hip ext L 5/5, R 4-/5 ,  post Tx: R hip 4+/5  .  B hip ab 4+/5        Palpation   Spinal mobility  ROM WFL . hypomobility of thoracic spine     SI assessment   L PSIS more posterior, L SIJ hypomobile in FADDIR/FADDER. R hip posterior ab/ER mm complex tightness, limited nutation of sacum,        Bed Mobility   Bed Mobility  --   head crunch      Transfers   Five time sit to stand comments   no genu valgus, no UE required                 Objective measurements  completed on examination: See above findings.    Pelvic Floor Special Questions - 07/24/18 1205    Diastasis Recti  neg    External Perineal Exam  through clothing, tenderness/ tightness at obt int. R        [facility] Adult PT Treatment/Exercise - 07/24/18 1141      Therapeutic Activites    Therapeutic Activities  --   toileting, standing, seated posture      Neuro Re-ed    Neuro Re-ed Details   cued for equal standing on both feet,  seated with feet on floor, more anterior tilt of pelvis       Exercises   Exercises  --   cued for proper technique ( see pt instruction)      Manual Therapy   Manual therapy comments  R long axis distraction, AP mob FADDIR/ FADDER, STM with MWM at R post. hip abd/ER complex, superior mob for sacral nutation                   PT Long Term Goals - 07/24/18 1132      PT LONG TERM GOAL #1   Title  Balance out bladder irritant  to water by decreasing 50% of bladder irritants  ( half odf decaf tea, juice) , increasing water from 40 oz to 64 oz.      Time  4    Period  Weeks    Status  New    Target Date  08/21/18      PT LONG TERM GOAL #2   Title  Pt will demo decreased R  posterior mm tightness, increased hip ext on R from 4-/5 to 5/5, no pelvic obliquities in order to eliminate bowels and minimzie risk for worsening of prolapse.    Time  6    Period  Weeks    Status  New    Target Date  09/04/18      PT LONG TERM GOAL #3   Title  Pt will demo proper pelvic floor coordination and contraction to minimize straining with bowel movement and improve LBP     Time  8    Period  Weeks    Status  New    Target Date  09/18/18      PT LONG TERM GOAL #4   Title  Pt will decrease her ODI score from 36 % to <18 % in order to minimize LBP to vaccuum     Time  10    Period  Weeks    Status  New    Target Date  10/02/18      PT LONG TERM GOAL #5   Title  Pt will decrease her PFIQ7 score from 62 % to <30 % in order to improve pelvic function  and decrease urge    Time  10    Period  Weeks    Status  New    Target Date  10/02/18      Additional Long Term Goals   Additional Long Term Goals  Yes      PT LONG TERM GOAL #6   Title  Pt will demo improved body mechanics with vaccumming, proper technique with deep core strengthening to perform ADLs    Time  2    Period  Weeks    Status  New    Target Date  08/07/18      PT LONG TERM GOAL #7   Title  Pt will report Type 4 stool  type without straining across 75% of her bowel movements and more regular bowel movements every other or daily in order to minimize straining. worsening of prolapse     Time  10    Period  Weeks    Status  New    Target Date  10/02/18      PT LONG TERM GOAL #8   Title  Pt will report improved pelvic floor contraction/ coordinaiton in order to gain no difficulty with initiating urination and less feeling of prolapse     Time  6    Period  Weeks    Status  New    Target Date  09/04/18             Plan - 07/24/18 1138    Clinical Impression Statement  Pt is a 78 year old female who reports of pelvic floor dysfunction including: OAB, constipation, prolapse Sx, and CLBP.  Pt  presents with limited spinal mobility,  dyscoordination and strength of pelvic floor mm, weak hip weakness on R, pelvic floor mm tightness on R, poor body mechanics which places strain on the abdominal/pelvic floor mm. These are deficits that indicate an ineffective intraabdominal pressure system associated with increased risk for her Sx.  Pt was provided education on etiology of Sx with anatomy, physiology explanation with images along with the benefits of customized pelvic PT Tx based on pt's medical conditions and musculoskeletal deficits. Pt was educated about balancing out bladder irritants to water to address OAB.  Following Tx today which pt tolerated without complaints, pt demo'd equal alignment of pelvic girdle and increased spinal/ SIJ mobility, decreased pelvic floor mm tightness. Anticipate these deficits will improve her CLBP, intraabdominal pressure system to minimize CLBP, OAB, CLBP, and optimize motility/ bowel elimination.       Personal Factors and Comorbidities  Age    Stability/Clinical Decision Making  Stable/Uncomplicated    Clinical Decision Making  Moderate    Rehab Potential  Good    PT Frequency  1x / week    PT Duration  Other (comment)   10   PT Treatment/Interventions  Neuromuscular re-education;Therapeutic activities;Therapeutic exercise;Patient/family education;Manual techniques;Moist Heat;Scar mobilization;Passive range of motion;Gait training    Consulted and Agree with Plan of Care  Patient       Patient will benefit from skilled therapeutic intervention in order to improve the following deficits and impairments:  Decreased safety awareness, Decreased activity tolerance, Decreased strength, Decreased range of motion, Improper body mechanics, Postural dysfunction, Hypomobility, Increased muscle spasms, Decreased endurance  Visit Diagnosis: Other muscle spasm  Chronic low back pain with left-sided sciatica, unspecified back pain laterality  Other lack of  coordination  Other abnormalities of gait and mobility     Problem List Patient Active Problem List   Diagnosis Date Noted  . Allergic reaction 06/09/2017  . DIVERTICULITIS, COLON 02/05/2009  . IBS 02/05/2009    Mariane Masters ,PT, DPT, E-RYT  07/24/2018, 3:32 PM  [facility] MAIN [facility] SERVICES [address] [place], [place], [address] Phone: [phone]   Fax:  [phone]  Name: Romanda Turrubiates MRN: [id number] Date of Birth: 11-17-1940

## 2018-07-24 NOTE — Patient Instructions (Addendum)
Ratio 28 fl oz of bladder irritant, 40 fl oz of water  ( current)    Restore the ratio Decrease decaf tea, juices by 50%,  14 fl oz of bladder irritants, increase water intake 64 floz    Daily water intake:to minimize constipation   From 40 oz to 64 oz  4 ( 16 fl oz) throughout, morning, mid morning-lunch, early afternoon/ dinner   _______   Sitting with feeet flat on ground under knees for better pelvic alignment to not bear down on pelvic floor     Standing with weight on both legs and avoid leaning to one R   ________   Pelvic floor lengthening  am/ pm    Frog stretch: laying on belly knees bent, inhale do nothing, exhale let ankles fall apart   10 x 3 reps      childs pose rocking  10 reps   _______    ________ Overactive Bladder goal:

## 2018-07-31 ENCOUNTER — Ambulatory Visit: Payer: Medicare Other | Admitting: Physical Therapy

## 2018-08-01 ENCOUNTER — Ambulatory Visit: Payer: Medicare Other | Admitting: Physical Therapy

## 2018-08-01 ENCOUNTER — Other Ambulatory Visit: Payer: Self-pay

## 2018-08-01 DIAGNOSIS — M62838 Other muscle spasm: Secondary | ICD-10-CM | POA: Diagnosis not present

## 2018-08-01 DIAGNOSIS — G8929 Other chronic pain: Secondary | ICD-10-CM

## 2018-08-01 DIAGNOSIS — R2689 Other abnormalities of gait and mobility: Secondary | ICD-10-CM

## 2018-08-01 DIAGNOSIS — R278 Other lack of coordination: Secondary | ICD-10-CM

## 2018-08-01 NOTE — Therapy (Signed)
[facility] MAIN [facility] SERVICES [address] [place], [place], [address] Phone: [phone]   Fax:  [phone]  Physical Therapy Treatment  Patient Details  Name: Kristi BadgerBarbara Maldonado MRN: [id number] Date of Birth: 1940-11-17 Referring Provider (PT): Moishe SpiceBeasley    Encounter Date: 08/01/2018    Past Medical History:  Diagnosis Date  . Difficult airway for intubation    Patient told by anesthesiology that she should tell future providers to always use a pediatric ET tube due to narrow airway and prior surgical issues including scarring  . Hypertension   . Thyroid disease     Past Surgical History:  Procedure Laterality Date  . SALIVARY GLAND SURGERY    . THYROID SURGERY      There were no vitals filed for this visit.  Subjective Assessment - 08/01/18 1012    Subjective  Pt reported no increased pain with last session nor soreness. Pt noticed her prolapse was not has low for the rest of the whole day.  Pt felt frustrated when she noticed her bladder dropped with hosehold chores.   Pt bought a Training and development officersquatty potty and found it to b e easier to have bowel movements           [facility] PT Assessment - 08/01/18 1013      Posture/Postural Control   Posture Comments  chest breathing pre Tx, post Tx: increased deep core coordination with verbal / tactile cues       Palpation   Spinal mobility  C7 hypomobile, levator scap R, medial scap mm attachment increased tightness R     SI assessment   PSIS equal levelled, normal mobility in SIJ B     Palpation comment  R scap/ thorax more posteriorly rotation pre Tx, post Tx: more equally aligned        Bed Mobility   Bed Mobility  --   grunting, long sitting                  [facility] Adult PT Treatment/Exercise - 08/01/18 1013      Neuro Re-ed    Neuro Re-ed Details   cued for relaxation, paced breathing, less chest breathing, alignment in new HEP       Moist Heat Therapy   Number Minutes Moist Heat  5  Minutes    Moist Heat Location  --   neck/ thoracic      Manual Therapy   Manual therapy comments  cervical distraction, inferior mob at scap spine, STM at medial mm attachments to scap                   PT Long Term Goals - 07/24/18 1132      PT LONG TERM GOAL #1   Title  Balance out bladder irritant  to water by decreasing 50% of bladder irritants  ( half odf decaf tea, juice) , increasing water from 40 oz to 64 oz.      Time  4    Period  Weeks    Status  New    Target Date  08/21/18      PT LONG TERM GOAL #2   Title  Pt will demo decreased R posterior mm tightness, increased hip ext on R from 4-/5 to 5/5, no pelvic obliquities in order to eliminate bowels and minimzie risk for worsening of prolapse.    Time  6    Period  Weeks    Status  New    Target  Date  09/04/18      PT LONG TERM GOAL #3   Title  Pt will demo proper pelvic floor coordination and contraction to minimize straining with bowel movement and improve LBP     Time  8    Period  Weeks    Status  New    Target Date  09/18/18      PT LONG TERM GOAL #4   Title  Pt will decrease her ODI score from 36 % to <18 % in order to minimize LBP to vaccuum     Time  10    Period  Weeks    Status  New    Target Date  10/02/18      PT LONG TERM GOAL #5   Title  Pt will decrease her PFIQ7 score from 62 % to <30 % in order to improve pelvic function  and decrease urge    Time  10    Period  Weeks    Status  New    Target Date  10/02/18      Additional Long Term Goals   Additional Long Term Goals  Yes      PT LONG TERM GOAL #6   Title  Pt will demo improved body mechanics with vaccumming, proper technique with deep core strengthening to perform ADLs    Time  2    Period  Weeks    Status  New    Target Date  08/07/18      PT LONG TERM GOAL #7   Title  Pt will report Type 4 stool type without straining across 75% of her bowel movements and more regular bowel movements every other or daily in order to  minimize straining. worsening of prolapse     Time  10    Period  Weeks    Status  New    Target Date  10/02/18      PT LONG TERM GOAL #8   Title  Pt will report improved pelvic floor contraction/ coordinaiton in order to gain no difficulty with initiating urination and less feeling of prolapse     Time  6    Period  Weeks    Status  New    Target Date  09/04/18            Plan - 08/01/18 1059    Clinical Impression Statement  Pt is making progress from last session at eval: easier bowel movements with feet on stool, less straining with bowel movements, no pelvic malalignment. Pt reported noticing prolapse less low after last session which lasted for the rest of the day.  Addressed pt's R thoracic tightness/scapular dyskinesis with manual Tx. After Tx, pt demo'd no tightness, improved scapular mobility, and increased diaphragmatic mobility. Progressed pt to deep core coordination but required excessive cues for less chest breathing to access optimal pelvic floor function. Pt was educated on applying mindfulness and breathing technique for decreasing prolapse Sx. Added body mechanics training to minimize downward pressure onto the pelvic floor. Provided motivation and encouragement to pt to minimize her frustration with prolapse. Discuessed the benefit of pessary fitting and pt is interested. Gynecologist has been contacted for pt to get a pesssary fitting. Pt continues to remain interested in ways to train her body to minimize prolapse. Pt will be ready for deep core strengthening next session and assessment of pelvic floor externally. Pt continues to benefit from skilled PT     Personal Factors and Comorbidities  Age  Stability/Clinical Decision Making  Stable/Uncomplicated    Rehab Potential  Good    PT Frequency  1x / week    PT Duration  Other (comment)   10   PT Treatment/Interventions  Neuromuscular re-education;Therapeutic activities;Therapeutic exercise;Patient/family  education;Manual techniques;Moist Heat;Scar mobilization;Passive range of motion;Gait training    Consulted and Agree with Plan of Care  Patient       Patient will benefit from skilled therapeutic intervention in order to improve the following deficits and impairments:  Decreased safety awareness, Decreased activity tolerance, Decreased strength, Decreased range of motion, Improper body mechanics, Postural dysfunction, Hypomobility, Increased muscle spasms, Decreased endurance  Visit Diagnosis: Chronic low back pain with left-sided sciatica, unspecified back pain laterality  Other lack of coordination  Other muscle spasm  Other abnormalities of gait and mobility     Problem List Patient Active Problem List   Diagnosis Date Noted  . Allergic reaction 06/09/2017  . DIVERTICULITIS, COLON 02/05/2009  . IBS 02/05/2009    Mariane Masters ,PT, DPT, E-RYT  08/01/2018, 11:18 AM  [facility] MAIN [facility] SERVICES [address] [place], [place], [address] Phone: [phone]   Fax:  [phone]  Name: Kristi Maldonado MRN: [id number] Date of Birth: March 08, 1940

## 2018-08-01 NOTE — Patient Instructions (Addendum)
In bed morning and night to loosen spine  6 directions for neck , keep h ead relaxed on bed   Chin up/down  ear to shoulder  turn neck to lookover shoulder    5reps each direction L and R   ____  Keep head and arms relaxed , dragging through the movement [employer], ( elbow bent  10 reps   ____  Deep core coordination level 1 ( see handout)   Breathing and mindfulness practice   ____  Decrease downward pressure onto pelvic floor to minimize worsening of prolapse  1)  Log roll out of bed  2)  Avoid holding your breath when Getting out of the chair:  Scoot to front part of chair chair Heels behind feet, feet are hip width apart, nose over toes  Inhale like you are smelling roses Exhale to stand      3) Handout on household chores

## 2018-08-07 ENCOUNTER — Ambulatory Visit: Payer: Medicare Other | Admitting: Physical Therapy

## 2018-08-08 ENCOUNTER — Other Ambulatory Visit: Payer: Self-pay

## 2018-08-08 ENCOUNTER — Ambulatory Visit: Payer: Medicare Other | Attending: Obstetrics and Gynecology | Admitting: Physical Therapy

## 2018-08-08 DIAGNOSIS — R278 Other lack of coordination: Secondary | ICD-10-CM

## 2018-08-08 DIAGNOSIS — G8929 Other chronic pain: Secondary | ICD-10-CM | POA: Diagnosis present

## 2018-08-08 DIAGNOSIS — R2689 Other abnormalities of gait and mobility: Secondary | ICD-10-CM | POA: Insufficient documentation

## 2018-08-08 DIAGNOSIS — M62838 Other muscle spasm: Secondary | ICD-10-CM | POA: Insufficient documentation

## 2018-08-08 DIAGNOSIS — M5442 Lumbago with sciatica, left side: Secondary | ICD-10-CM | POA: Insufficient documentation

## 2018-08-08 NOTE — Patient Instructions (Signed)
Pillow under hips/ back for deep core 1 and 2   Restorative pose ( legs up over pillw or couch arm) 10 min after activities to relax

## 2018-08-08 NOTE — Therapy (Signed)
[place] [facility] MAIN [facility] SERVICES [address] [place], [place], [address] Phone: [phone]   Fax:  [phone]  Physical Therapy Treatment  Patient Details  Name: Kristi Maldonado MRN: [id number] Date of Birth: 1940-04-08 Referring Provider (PT): Dalbert Garnet    Encounter Date: 08/08/2018  PT End of Session - 08/08/18 1158    Visit Number  3    Number of Visits  10    Date for PT Re-Evaluation  10/02/18    PT Start Time  1030    PT Stop Time  1125    PT Time Calculation (min)  55 min    Activity Tolerance  Patient tolerated treatment well;No increased pain    Behavior During Therapy  WFL for tasks assessed/performed       Past Medical History:  Diagnosis Date  . Difficult airway for intubation    Patient told by anesthesiology that she should tell future providers to always use a pediatric ET tube due to narrow airway and prior surgical issues including scarring  . Hypertension   . Thyroid disease     Past Surgical History:  Procedure Laterality Date  . SALIVARY GLAND SURGERY    . THYROID SURGERY      There were no vitals filed for this visit.  Subjective Assessment - 08/08/18 1035    Subjective  Pt reported feeling fine after last session. Pt has had no pain in her back for the past week.  Pt got a swivel steam mops which helps her to not bend as much.  Pt is conscious of wiping counters without bending          [facility] PT Assessment - 08/08/18 1154      Coordination   Gross Motor Movements are Fluid and Coordinated  --   ab straining w/ deep core ( required cues for initiating pfm               Pelvic Floor Special Questions - 08/08/18 1153    External Perineal Exam  through clothing, increased tensions ischiocavenosus, supra pubic         [facility] Adult PT Treatment/Exercise - 08/08/18 1155      Neuro Re-ed    Neuro Re-ed Details   tactile, visual, verbal cues for deep core level 2 , guided relaxation        Modalities   Modalities  Moist Heat      Moist Heat Therapy   Number Minutes Moist Heat  5 Minutes    Moist Heat Location  Other (comment)   perineal. with pillow. clothing      Manual Therapy   Manual therapy comments  MWM /pelvic tilts, fascial/ STM along B ischiocavernosus, over pubic symphysis                  PT Long Term Goals - 07/24/18 1132      PT LONG TERM GOAL #1   Title  Balance out bladder irritant  to water by decreasing 50% of bladder irritants  ( half odf decaf tea, juice) , increasing water from 40 oz to 64 oz.      Time  4    Period  Weeks    Status  New    Target Date  08/21/18      PT LONG TERM GOAL #2   Title  Pt will demo decreased R posterior mm tightness, increased hip ext on R from 4-/5 to 5/5, no pelvic obliquities in order to eliminate  bowels and minimzie risk for worsening of prolapse.    Time  6    Period  Weeks    Status  New    Target Date  09/04/18      PT LONG TERM GOAL #3   Title  Pt will demo proper pelvic floor coordination and contraction to minimize straining with bowel movement and improve LBP     Time  8    Period  Weeks    Status  New    Target Date  09/18/18      PT LONG TERM GOAL #4   Title  Pt will decrease her ODI score from 36 % to <18 % in order to minimize LBP to vaccuum     Time  10    Period  Weeks    Status  New    Target Date  10/02/18      PT LONG TERM GOAL #5   Title  Pt will decrease her PFIQ7 score from 62 % to <30 % in order to improve pelvic function  and decrease urge    Time  10    Period  Weeks    Status  New    Target Date  10/02/18      Additional Long Term Goals   Additional Long Term Goals  Yes      PT LONG TERM GOAL #6   Title  Pt will demo improved body mechanics with vaccumming, proper technique with deep core strengthening to perform ADLs    Time  2    Period  Weeks    Status  New    Target Date  08/07/18      PT LONG TERM GOAL #7   Title  Pt will report Type 4 stool type  without straining across 75% of her bowel movements and more regular bowel movements every other or daily in order to minimize straining. worsening of prolapse     Time  10    Period  Weeks    Status  New    Target Date  10/02/18      PT LONG TERM GOAL #8   Title  Pt will report improved pelvic floor contraction/ coordinaiton in order to gain no difficulty with initiating urination and less feeling of prolapse     Time  6    Period  Weeks    Status  New    Target Date  09/04/18            Plan - 08/08/18 1158    Clinical Impression Statement  Pt continues to have resolved back pain since her 1st visit 2 weeks ago. Progressed with pelvic floor strengthening with pillow under hips for increased co-activation of pelvic floor and deep core. Pt required cues for proper technique for less abdominal overuse. Pt demo'd correctly post training. Pt continues to benefit from skilled PT.   Personal Factors and Comorbidities  Age    Stability/Clinical Decision Making  Stable/Uncomplicated    Rehab Potential  Good    PT Frequency  1x / week    PT Duration  Other (comment)   10   PT Treatment/Interventions  Neuromuscular re-education;Therapeutic activities;Therapeutic exercise;Patient/family education;Manual techniques;Moist Heat;Scar mobilization;Passive range of motion;Gait training    Consulted and Agree with Plan of Care  Patient       Patient will benefit from skilled therapeutic intervention in order to improve the following deficits and impairments:  Decreased safety awareness, Decreased activity tolerance, Decreased strength, Decreased range  of motion, Improper body mechanics, Postural dysfunction, Hypomobility, Increased muscle spasms, Decreased endurance  Visit Diagnosis: Chronic low back pain with left-sided sciatica, unspecified back pain laterality  Other muscle spasm  Other lack of coordination  Other abnormalities of gait and mobility     Problem List Patient Active  Problem List   Diagnosis Date Noted  . Allergic reaction 06/09/2017  . DIVERTICULITIS, COLON 02/05/2009  . IBS 02/05/2009    Mariane MastersYeung,Shin Yiing ,PT, DPT, E-RYT  08/08/2018, 12:02 PM  [place] [facility] [address] [place], [place], [address] Phone: [phone]   Fax:  [phone]  Name: Towana BadgerBarbara Dirden MRN: [id number] Date of Birth: 1940/10/06

## 2018-08-12 ENCOUNTER — Telehealth: Payer: Self-pay | Admitting: Physical Therapy

## 2018-08-12 NOTE — Telephone Encounter (Signed)
DPT left message for Dr. Leafy Ro re: pt's interest in getting fitted for pessary. Pt is making improvements with PT .

## 2018-08-14 ENCOUNTER — Ambulatory Visit: Payer: Medicare Other | Admitting: Physical Therapy

## 2018-08-15 ENCOUNTER — Ambulatory Visit: Payer: Medicare Other | Admitting: Physical Therapy

## 2018-08-15 ENCOUNTER — Other Ambulatory Visit: Payer: Self-pay

## 2018-08-15 DIAGNOSIS — M5442 Lumbago with sciatica, left side: Secondary | ICD-10-CM

## 2018-08-15 DIAGNOSIS — M62838 Other muscle spasm: Secondary | ICD-10-CM

## 2018-08-15 DIAGNOSIS — R278 Other lack of coordination: Secondary | ICD-10-CM

## 2018-08-15 DIAGNOSIS — G8929 Other chronic pain: Secondary | ICD-10-CM

## 2018-08-15 DIAGNOSIS — R2689 Other abnormalities of gait and mobility: Secondary | ICD-10-CM

## 2018-08-15 NOTE — Therapy (Signed)
[place] [facility] [address] [place], [place], [address] Phone: [phone]   Fax:  [phone]  Physical Therapy Treatment  Patient Details  Name: Kristi BadgerBarbara Maldonado MRN: [id number] Date of Birth: 08-19-1940 Referring Provider (PT): Dalbert GarnetBeasley    Encounter Date: 08/15/2018  PT End of Session - 08/15/18 1107    Visit Number  4    Number of Visits  10    Date for PT Re-Evaluation  10/02/18    PT Start Time  1025    PT Stop Time  1120    PT Time Calculation (min)  55 min    Activity Tolerance  Patient tolerated treatment well;No increased pain    Behavior During Therapy  WFL for tasks assessed/performed       Past Medical History:  Diagnosis Date  . Difficult airway for intubation    Patient told by anesthesiology that she should tell future providers to always use a pediatric ET tube due to narrow airway and prior surgical issues including scarring  . Hypertension   . Thyroid disease     Past Surgical History:  Procedure Laterality Date  . SALIVARY GLAND SURGERY    . THYROID SURGERY      There were no vitals filed for this visit.  Subjective Assessment - 08/15/18 1030    Subjective  Pt felt pretty good after last session. Pt is scheduled for a pessary fitting.         [facility] - 08/15/18 1104      Observation/Other Assessments   Observations  sit to stand, feet and knees together , no BUE support       Coordination   Gross Motor Movements are Fluid and Coordinated  --   poor propioception of co-activation of BLE               Pelvic Floor Special Questions - 08/15/18 1101    External Perineal Exam  without clothing, bladder outside introitus in hooklying and standing.  Standing w/ adductor overuse with cue for pelvic floor contraction. Hooklying, with ab straining downward movement of bladder and pelvic floor. Placed pillow un der hips, and with training and external manual Tx, pt demo'd closure  of labia, bladder not visible outside introitus    External Palpation  increased tightness R ischioanal fossa / obt internus     Prolapse  Anterior Wall        [facility] Adult PT Treatment/Exercise - 08/15/18 1108      Therapeutic Activites    Therapeutic Activities  --   guided relaxation practice     Neuro Re-ed    Neuro Re-ed Details   excessive cues tactile and verbal for less ab straining       Modalities   Modalities  Moist Heat      Moist Heat Therapy   Number Minutes Moist Heat  5 Minutes    Moist Heat Location  Other (comment)   ab and pelvic floor through pillow/ sheet .      Manual Therapy   Manual therapy comments  STM at anterior triangle of mm w/ coordinated breathing cues to faciliate upward movement of pelvic floor mm,  STM at ischioanal fossa/ medial tuberosity with MWM to release obt internus tightness                  PT Long Term Goals - 07/24/18 1132      PT LONG TERM GOAL #1   Title  Balance out bladder irritant  to water by decreasing 50% of bladder irritants  ( half odf decaf tea, juice) , increasing water from 40 oz to 64 oz.      Time  4    Period  Weeks    Status  New    Target Date  08/21/18      PT LONG TERM GOAL #2   Title  Pt will demo decreased R posterior mm tightness, increased hip ext on R from 4-/5 to 5/5, no pelvic obliquities in order to eliminate bowels and minimzie risk for worsening of prolapse.    Time  6    Period  Weeks    Status  New    Target Date  09/04/18      PT LONG TERM GOAL #3   Title  Pt will demo proper pelvic floor coordination and contraction to minimize straining with bowel movement and improve LBP     Time  8    Period  Weeks    Status  New    Target Date  09/18/18      PT LONG TERM GOAL #4   Title  Pt will decrease her ODI score from 36 % to <18 % in order to minimize LBP to vaccuum     Time  10    Period  Weeks    Status  New    Target Date  10/02/18      PT LONG TERM GOAL #5   Title  Pt will  decrease her PFIQ7 score from 62 % to <30 % in order to improve pelvic function  and decrease urge    Time  10    Period  Weeks    Status  New    Target Date  10/02/18      Additional Long Term Goals   Additional Long Term Goals  Yes      PT LONG TERM GOAL #6   Title  Pt will demo improved body mechanics with vaccumming, proper technique with deep core strengthening to perform ADLs    Time  2    Period  Weeks    Status  New    Target Date  08/07/18      PT LONG TERM GOAL #7   Title  Pt will report Type 4 stool type without straining across 75% of her bowel movements and more regular bowel movements every other or daily in order to minimize straining. worsening of prolapse     Time  10    Period  Weeks    Status  New    Target Date  10/02/18      PT LONG TERM GOAL #8   Title  Pt will report improved pelvic floor contraction/ coordinaiton in order to gain no difficulty with initiating urination and less feeling of prolapse     Time  6    Period  Weeks    Status  New    Target Date  09/04/18            Plan - 08/15/18 1115    Clinical Impression Statement For the past 2 weeks, pt 's LBP has resolved with past Tx. Pt is also no longer straining with bowel movements which is advantageous to minimizing prolapse.  Pt required further neuromuscular training to not push downward onto her pelvic floor mm with abdominal muscles. Pt demo'd technique of deep core coordination level 1 and 2 correctly post Tx. Following manual Tx which pt tolerated  without complaints, pt demo'd closure of labia and bladder no longer visible outside of introitus ( pt in hooklying with pillow under hips/ back).  Pt will continue to required skilled PT to improve dyscoordination of deep core mm which will in turn minimize worsening of prolapse. Pt has an appt with Dr. Leafy Ro for pessary fitting which will also help.    Personal Factors and Comorbidities  Age    Stability/Clinical Decision Making   Stable/Uncomplicated    Rehab Potential  Good    PT Frequency  1x / week    PT Duration  Other (comment)   10   PT Treatment/Interventions  Neuromuscular re-education;Therapeutic activities;Therapeutic exercise;Patient/family education;Manual techniques;Moist Heat;Scar mobilization;Passive range of motion;Gait training    Consulted and Agree with Plan of Care  Patient       Patient will benefit from skilled therapeutic intervention in order to improve the following deficits and impairments:  Decreased safety awareness, Decreased activity tolerance, Decreased strength, Decreased range of motion, Improper body mechanics, Postural dysfunction, Hypomobility, Increased muscle spasms, Decreased endurance  Visit Diagnosis: Chronic low back pain with left-sided sciatica, unspecified back pain laterality  Other muscle spasm  Other lack of coordination  Other abnormalities of gait and mobility     Problem List Patient Active Problem List   Diagnosis Date Noted  . Allergic reaction 06/09/2017  . DIVERTICULITIS, COLON 02/05/2009  . IBS 02/05/2009    Jerl Mina ,PT, DPT, E-RYT  08/15/2018, 11:17 AM  [place] MAIN [facility] SERVICES [address] [place], [place], [address] Phone: [phone]   Fax:  [phone]  Name: Kristi Maldonado MRN: [id number] Date of Birth: [birth year]

## 2018-08-15 NOTE — Patient Instructions (Addendum)
Practice proper pelvic floor coordination  Inhale: expand pelvic floor muscles Exhale" "j" scoop, allow pelvic floor to close, lift first before belly sinks   ( not "draw abdominal muscle to spine" or strain with abdominal muscles")  ________  Continue with pillow under hips/back with   Deep core level 1 + quick squeeze ( J-scoop movement not pushing down with ab)  Deep core level 2  LESS IS MORE   _______  USE "[place]" exhalation for quick relaxation of muscles   Proper body mechanics with getting out of a chair to decrease strain  on back &pelvic floor   Avoid holding your breath when Getting out of the chair:  Scoot to front part of chair chair Heels behind feet, feet are hip width apart, nose over toes  Inhale like you are smelling roses, push with hand off chair  KEEP TRACKING KNEES WITH HIPS NOT NEXT TO EACH OTHER  Exhale to stand

## 2018-08-21 ENCOUNTER — Ambulatory Visit: Payer: Medicare Other | Admitting: Physical Therapy

## 2018-08-22 ENCOUNTER — Ambulatory Visit: Payer: Medicare Other | Admitting: Physical Therapy

## 2018-08-22 ENCOUNTER — Other Ambulatory Visit: Payer: Self-pay

## 2018-08-22 DIAGNOSIS — G8929 Other chronic pain: Secondary | ICD-10-CM

## 2018-08-22 DIAGNOSIS — R278 Other lack of coordination: Secondary | ICD-10-CM

## 2018-08-22 DIAGNOSIS — R2689 Other abnormalities of gait and mobility: Secondary | ICD-10-CM

## 2018-08-22 DIAGNOSIS — M5442 Lumbago with sciatica, left side: Secondary | ICD-10-CM

## 2018-08-22 DIAGNOSIS — M62838 Other muscle spasm: Secondary | ICD-10-CM

## 2018-08-22 NOTE — Patient Instructions (Addendum)
Bedmaking:  Use all -fours position to tuck sheets or ski track stance ( maintain center between legs not too far forward on front leg)     Getting out of a chair to decrease strain  on back &pelvic floor   Avoid holding your breath when Getting out of the chair:  Scoot to front part of chair chair Heels behind feet, feet are hip width apart, nose over toes  Inhale like you are smelling roses Exhale to stand    Loading dishes: Place dishes on counter to minimize twisting Use mini squat to load   Placing dish into cabinet: Ski track stance, weight shift forward, and maintain weight into back leg  Wiping counters Avoid bending forward , stand perpendicular and wipe with trunk upright  Place pictures at locations to remember new ways to do daily tasks

## 2018-08-22 NOTE — Therapy (Signed)
[place] MAIN [facility] SERVICES [address] [place], [place], [address] Phone: [phone]   Fax:  [phone]  Physical Therapy Treatment  Patient Details  Name: Kristi Maldonado MRN: [id number] Date of Birth: [birth year] Referring Provider (PT): Leafy Ro    Encounter Date: 08/22/2018  PT End of Session - 08/22/18 1022    Visit Number  5    Number of Visits  10    Date for PT Re-Evaluation  10/02/18    PT Start Time  1020    PT Stop Time  1130    PT Time Calculation (min)  70 min    Activity Tolerance  Patient tolerated treatment well;No increased pain    Behavior During Therapy  WFL for tasks assessed/performed       Past Medical History:  Diagnosis Date  . Difficult airway for intubation    Patient told by anesthesiology that she should tell future providers to always use a pediatric ET tube due to narrow airway and prior surgical issues including scarring  . Hypertension   . Thyroid disease     Past Surgical History:  Procedure Laterality Date  . SALIVARY GLAND SURGERY    . THYROID SURGERY      There were no vitals filed for this visit.  Subjective Assessment - 08/22/18 1022    Subjective  For a couple of days, pt felt her bladder descend more and more. Especially the day before yesterday. It frustrated her because she did not do anything different. P 's usual tasks at home include: emptying/ loading dishwasher, making the bed. Pt is doing something all the time. Pt is consistently moving.Pt did not feel it when she was walking in her neighborhood. Pt has an urge to pee and she can not pee because her bladder has descended. Pt goes to lie down with a pillow under her hips and her bladder goes back into place.  When she gets up, she can not make it the bathroom and has leakage sometimes. Pt lives by herself and she does not hestitate to lift something up, climb up step stool.         [facility] PT Assessment - 08/22/18 2017      Squat   Comments  anterior COM, knees poor alignment      Lunges   Comments  poor  lower kinetic chain alignment, more anterior COM       Sit to Stand   Comments  genu valgus with/ out BUE support without cuing                   [facility] Adult PT Treatment/Exercise - 08/22/18 2034      Therapeutic Activites    ADL's  simulated tasks: making bed with less strain on back/pelvic floor,     Lifting  education with proper mechanics     Other Therapeutic Activities  putting away dishes, loading/ unloading dishes into dishwasher, making bed with less back strain       Neuro Re-ed    Neuro Re-ed Details   proper propioception and alignment in daily tasks to minimize strain on pelvic floor.  Biopsychosocial approaches and explanation to build encouragement and understanding how to minimize worsening of prolapse. Discussed psychotherapy referral but pt wanted to wait.    Minimal cues for deep core ex                  PT Long Term Goals - 08/22/18 2109  PT LONG TERM GOAL #1   Title  Balance out bladder irritant  to water by decreasing 50% of bladder irritants  ( half odf decaf tea, juice) , increasing water from 40 oz to 64 oz.      Time  4    Period  Weeks    Status  On-going      PT LONG TERM GOAL #2   Title  Pt will demo decreased R posterior mm tightness, increased hip ext on R from 4-/5 to 5/5, no pelvic obliquities in order to eliminate bowels and minimzie risk for worsening of prolapse.    Time  6    Period  Weeks    Status  On-going      PT LONG TERM GOAL #3   Title  Pt will demo proper pelvic floor coordination and contraction to minimize straining with bowel movement and improve LBP     Time  8    Period  Weeks    Status  On-going      PT LONG TERM GOAL #4   Title  Pt will decrease her ODI score from 36 % to <18 % in order to minimize LBP to vaccuum     Time  10    Period  Weeks    Status  On-going      PT LONG TERM GOAL #5   Title  Pt will decrease  her PFIQ7 score from 62 % to <30 % in order to improve pelvic function  and decrease urge    Time  10    Period  Weeks    Status  On-going      PT LONG TERM GOAL #6   Title  Pt will demo improved body mechanics with vaccumming, proper technique with deep core strengthening to perform ADLs    Time  2    Period  Weeks    Status  On-going      PT LONG TERM GOAL #7   Title  Pt will report Type 4 stool type without straining across 75% of her bowel movements and more regular bowel movements every other or daily in order to minimize straining. worsening of prolapse     Time  10    Period  Weeks    Status  On-going      PT LONG TERM GOAL #8   Title  Pt will report improved pelvic floor contraction/ coordinaiton in order to gain no difficulty with initiating urination and less feeling of prolapse     Time  6    Period  Weeks    Status  On-going            Plan - 08/22/18 2043    Clinical Impression Statement  Pt showed good carry over with deep core coordination with minimal cuing. Today provided biopsychosocial approaches to encourage and motivate pt to be compliant with HEP. Focused on body mechanics with making bed, unload/loading dishes, reaching into high and low cabinets with less load onto pelvic floor and optimize gluts/ posterior kinetic chain. Pt demo'd corectly with tactile and visual cues. Provided motivational interviewing to promote compliance with HEP. Pt continues to benefit from skilled PT.   Personal Factors and Comorbidities  Age    Stability/Clinical Decision Making  Stable/Uncomplicated    Rehab Potential  Good    PT Frequency  1x / week    PT Duration  Other (comment)   10   PT Treatment/Interventions  Neuromuscular re-education;Therapeutic activities;Therapeutic exercise;Patient/family education;Manual  techniques;Moist Heat;Scar mobilization;Passive range of motion;Gait training    Consulted and Agree with Plan of Care  Patient       Patient will benefit from  skilled therapeutic intervention in order to improve the following deficits and impairments:  Decreased safety awareness, Decreased activity tolerance, Decreased strength, Decreased range of motion, Improper body mechanics, Postural dysfunction, Hypomobility, Increased muscle spasms, Decreased endurance  Visit Diagnosis: 1. Chronic low back pain with left-sided sciatica, unspecified back pain laterality   2. Other muscle spasm   3. Other lack of coordination   4. Other abnormalities of gait and mobility        Problem List Patient Active Problem List   Diagnosis Date Noted  . Allergic reaction 06/09/2017  . DIVERTICULITIS, COLON 02/05/2009  . IBS 02/05/2009    Mariane MastersYeung,Shin Yiing 08/22/2018, 9:09 PM  [place] [facility] [address] [place], [place], [address] Phone: [phone]   Fax:  [phone]  Name: Towana BadgerBarbara Siek MRN: [id number] Date of Birth: 1940-06-27

## 2018-08-28 ENCOUNTER — Ambulatory Visit: Payer: Medicare Other | Admitting: Physical Therapy

## 2018-08-29 ENCOUNTER — Ambulatory Visit: Payer: Medicare Other | Admitting: Physical Therapy

## 2018-09-04 ENCOUNTER — Ambulatory Visit: Payer: Medicare Other | Admitting: Physical Therapy

## 2018-09-05 ENCOUNTER — Other Ambulatory Visit: Payer: Self-pay

## 2018-09-05 ENCOUNTER — Ambulatory Visit: Payer: Medicare Other | Attending: Obstetrics and Gynecology | Admitting: Physical Therapy

## 2018-09-05 DIAGNOSIS — M62838 Other muscle spasm: Secondary | ICD-10-CM | POA: Diagnosis present

## 2018-09-05 DIAGNOSIS — R278 Other lack of coordination: Secondary | ICD-10-CM | POA: Diagnosis present

## 2018-09-05 DIAGNOSIS — R2689 Other abnormalities of gait and mobility: Secondary | ICD-10-CM | POA: Diagnosis present

## 2018-09-05 DIAGNOSIS — M533 Sacrococcygeal disorders, not elsewhere classified: Secondary | ICD-10-CM | POA: Insufficient documentation

## 2018-09-05 DIAGNOSIS — G8929 Other chronic pain: Secondary | ICD-10-CM | POA: Diagnosis present

## 2018-09-05 DIAGNOSIS — M5442 Lumbago with sciatica, left side: Secondary | ICD-10-CM | POA: Diagnosis not present

## 2018-09-05 NOTE — Therapy (Signed)
[place] [facility] [address] [place], [place], [address] Phone: [phone]   Fax:  [phone]  Physical Therapy Treatment  Patient Details  Name: Kristi BadgerBarbara Thrun MRN: [id number] Date of Birth: 29-Sep-1940 Referring Provider (PT): Dalbert GarnetBeasley    Encounter Date: 09/05/2018  PT End of Session - 09/05/18 1132    Visit Number  6    Number of Visits  10    Date for PT Re-Evaluation  10/02/18    PT Start Time  1030    PT Stop Time  1132    PT Time Calculation (min)  62 min    Activity Tolerance  Patient tolerated treatment well;No increased pain    Behavior During Therapy  WFL for tasks assessed/performed       Past Medical History:  Diagnosis Date  . Difficult airway for intubation    Patient told by anesthesiology that she should tell future providers to always use a pediatric ET tube due to narrow airway and prior surgical issues including scarring  . Hypertension   . Thyroid disease     Past Surgical History:  Procedure Laterality Date  . SALIVARY GLAND SURGERY    . THYROID SURGERY      There were no vitals filed for this visit.  Subjective Assessment - 09/05/18 1037    Subjective  Pt felt her pressure feeling got really worse yesterday. There is urgency. When she sits down from standing and when she sits down, it hurts. She feels if she just urinates, she will get some relief. Pt got fitted for her pessary last week. Pt had a cystoscopy, CT scan, US last week. The images showed bladder pressing on her ureter and causing swelling in her kidney which is why she is feeling the pain. 8/10. After leaving her session two weeks ago, pt felt good and had no pain.  Pt explains she sleeps on her belly, and when she is on her side, she does not sleep with a pillow.         [facility] - 09/05/18 1122      Palpation   SI assessment   limited ER/abd, tightness at ischial tuberosity R, limitation at FABD/ER PAVM     Palpation  comment  supra pubic fascial restrictions, anterior triangle mm  tightness       sit stand with genu valgus/ adduction and report of pain No pain post Tx           Pelvic Floor Special Questions - 09/05/18 1121    External Perineal Exam  through undergarment     External Palpation  increased tightness along ischiocavernosus/ transverse perineal, coccygeus R > L         [facility] Adult PT Treatment/Exercise - 09/05/18 1123      Therapeutic Activites    Other Therapeutic Activities  discussed transitioning from belly sleeping to sleeping on back to minimize causing anterior pressure of bladder and asymmetric mm tightness when belly sleeping. reinforced importance of compliance to deep core strengthening and body mechanics training        Exercises   Exercises  --   cued for technique for pelvic floor stretches      Modalities   Modalities  Moist Heat      Moist Heat Therapy   Number Minutes Moist Heat  5 Minutes    Moist Heat Location  Other (comment)   suprapubic, perineum through sheets/ pillow case(not billed)     Manual  Therapy   Manual therapy comments  STM/MWM fascial releases over lower abdomen, STM/MWM at anterior triangle, PA mob GRade IImob FABD ER R,  STM/MWM at obt int/ coccygeus, posterior hip abd mm                   PT Long Term Goals - 08/22/18 2109      PT LONG TERM GOAL #1   Title  Balance out bladder irritant  to water by decreasing 50% of bladder irritants  ( half odf decaf tea, juice) , increasing water from 40 oz to 64 oz.      Time  4    Period  Weeks    Status  On-going      PT LONG TERM GOAL #2   Title  Pt will demo decreased R posterior mm tightness, increased hip ext on R from 4-/5 to 5/5, no pelvic obliquities in order to eliminate bowels and minimzie risk for worsening of prolapse.    Time  6    Period  Weeks    Status  On-going      PT LONG TERM GOAL #3   Title  Pt will demo proper pelvic floor coordination and contraction to  minimize straining with bowel movement and improve LBP     Time  8    Period  Weeks    Status  On-going      PT LONG TERM GOAL #4   Title  Pt will decrease her ODI score from 36 % to <18 % in order to minimize LBP to vaccuum     Time  10    Period  Weeks    Status  On-going      PT LONG TERM GOAL #5   Title  Pt will decrease her PFIQ7 score from 62 % to <30 % in order to improve pelvic function  and decrease urge    Time  10    Period  Weeks    Status  On-going      PT LONG TERM GOAL #6   Title  Pt will demo improved body mechanics with vaccumming, proper technique with deep core strengthening to perform ADLs    Time  2    Period  Weeks    Status  On-going      PT LONG TERM GOAL #7   Title  Pt will report Type 4 stool type without straining across 75% of her bowel movements and more regular bowel movements every other or daily in order to minimize straining. worsening of prolapse     Time  10    Period  Weeks    Status  On-going      PT LONG TERM GOAL #8   Title  Pt will report improved pelvic floor contraction/ coordinaiton in order to gain no difficulty with initiating urination and less feeling of prolapse     Time  6    Period  Weeks    Status  On-going            Plan - 09/05/18 1132    Clinical Impression Statement  Pt reported 8/10 pain with pressure sensation at suprapubic area pre Tx and  0/10 following Tx. Suspect pt's pressure sensation is related to pt being a belly sleeper with one hip/knee flexed. Pt demo'd asymmetrical pelvic floor tightness with limited hip/sacral mobility, fascial restrictions over suprapubic area, and anterior mm tightness of pelvic floor. Pt's sit to stand improved with no genu valgus and  reported of no pain. Continued to reinforce importance of compliance to HEP, body mechanics training, and relaxation to improve prolapse Sx. Pt has been fitted for pessary and waiting for it to arrive. Anticipate pessary will be an asset to her progress.  Plan to progress with hip/ thoracolumbar strengthening at next session. Pt continues to benefit from skilled PT.    Personal Factors and Comorbidities  Age    Stability/Clinical Decision Making  Stable/Uncomplicated    Rehab Potential  Good    PT Frequency  1x / week    PT Duration  Other (comment)   10   PT Treatment/Interventions  Neuromuscular re-education;Therapeutic activities;Therapeutic exercise;Patient/family education;Manual techniques;Moist Heat;Scar mobilization;Passive range of motion;Gait training    Consulted and Agree with Plan of Care  Patient       Patient will benefit from skilled therapeutic intervention in order to improve the following deficits and impairments:  Decreased safety awareness, Decreased activity tolerance, Decreased strength, Decreased range of motion, Improper body mechanics, Postural dysfunction, Hypomobility, Increased muscle spasms, Decreased endurance  Visit Diagnosis: 1. Chronic low back pain with left-sided sciatica, unspecified back pain laterality   2. Other muscle spasm   3. Other lack of coordination   4. Other abnormalities of gait and mobility        Problem List Patient Active Problem List   Diagnosis Date Noted  . Allergic reaction 06/09/2017  . DIVERTICULITIS, COLON 02/05/2009  . IBS 02/05/2009    Mariane MastersYeung,Shin Yiing ,PT, DPT, E-RYT  09/05/2018, 11:35 AM  [place] [facility] [address] [place], [place], [address] Phone: [phone]   Fax:  [phone]  Name: Kristi BadgerBarbara Maldonado MRN: [id number] Date of Birth: 11-29-1940

## 2018-09-05 NOTE — Patient Instructions (Signed)
Sleep ing on back with pillows under arms, pillow under knees  Sleeping on side with pillow between knees  Avoid belly sleeping to minimize back and pelvic floor issues  ____  Stretches for pelvic floor   Stretch Pelvic floor   "v heels slide away and then back toward buttocks and then rock knee to slight ,  slide heel along at 11 o clock away from buttocks   10 reps    ____  Continue with 2x day  Deep core level 1 and 2 with pilow under hips  ___ Continue with body mechanics  With household chores and     Proper body mechanics with getting out of a chair to decrease strain  on back &pelvic floor   Avoid holding your breath when Getting out of the chair:  Scoot to front part of chair chair Heels behind feet, feet are hip width apart, nose over toes  Inhale like you are smelling roses Exhale to stand

## 2018-09-09 ENCOUNTER — Ambulatory Visit: Payer: Self-pay | Admitting: Urology

## 2018-09-09 ENCOUNTER — Encounter

## 2018-09-11 ENCOUNTER — Ambulatory Visit: Payer: Medicare Other | Admitting: Physical Therapy

## 2018-09-12 ENCOUNTER — Ambulatory Visit: Payer: Medicare Other | Admitting: Physical Therapy

## 2018-09-12 ENCOUNTER — Other Ambulatory Visit: Payer: Self-pay

## 2018-09-12 DIAGNOSIS — M5442 Lumbago with sciatica, left side: Secondary | ICD-10-CM

## 2018-09-12 DIAGNOSIS — G8929 Other chronic pain: Secondary | ICD-10-CM

## 2018-09-12 DIAGNOSIS — R278 Other lack of coordination: Secondary | ICD-10-CM

## 2018-09-12 DIAGNOSIS — M62838 Other muscle spasm: Secondary | ICD-10-CM

## 2018-09-12 DIAGNOSIS — R2689 Other abnormalities of gait and mobility: Secondary | ICD-10-CM

## 2018-09-12 NOTE — Therapy (Signed)
[place] [address] [place], [place], [address] Phone: [phone]   Fax:  [phone]  Physical Therapy Treatment  Patient Details  Name: Kristi BadgerBarbara Maldonado MRN: [id number] Date of Birth: June 19, 1940 Referring Provider (PT): Dalbert GarnetBeasley    Encounter Date: 09/12/2018  PT End of Session - 09/12/18 1223    Visit Number  7    Number of Visits  10    Date for PT Re-Evaluation  10/02/18    PT Start Time  1000    PT Stop Time  1110    PT Time Calculation (min)  70 min    Activity Tolerance  Patient tolerated treatment well;No increased pain    Behavior During Therapy  WFL for tasks assessed/performed       Past Medical History:  Diagnosis Date  . Difficult airway for intubation    Patient told by anesthesiology that she should tell future providers to always use a pediatric ET tube due to narrow airway and prior surgical issues including scarring  . Hypertension   . Thyroid disease     Past Surgical History:  Procedure Laterality Date  . SALIVARY GLAND SURGERY    . THYROID SURGERY      There were no vitals filed for this visit.  Subjective Assessment - 09/12/18 1005    Subjective  Pt had 2 really good days with more rest in the middle of the day and performed her exericses. Pt felt good after last session and the prolapse did not bother her until dinner time. Pt tried sleeping on her back every night and it is getting better. Pt did not experience anterior pubic pain the past week.         [facility] - 09/12/18 1026      Floor to Stand   Comments  limited hamstring flexibilty in downward facing dog pose method      Posture/Postural Control   Posture Comments  quadriped-birddog with no pelvic drop nor lumbar lordosis       Strength   Overall Strength Comments  hip abd B 5/5 , hip ext 3+/5 B,  hip flex 4-/5 knee flex 4-/5, knee ext 4-/5                    [facility] Adult PT Treatment/Exercise -  09/12/18 1026      Therapeutic Activites    Other Therapeutic Activities  explanations for choices to stretches, fitness routine to minimize strain on pelvic floor       Neuro Re-ed    Neuro Re-ed Details   cued for floor<> stand technique, hamstring stretch techniques to minimize strain on back/ pelvic floor       Exercises   Exercises  --   new exercises for hamstring stretches ( HEP), birddog                  PT Long Term Goals - 08/22/18 2109      PT LONG TERM GOAL #1   Title  Balance out bladder irritant  to water by decreasing 50% of bladder irritants  ( half odf decaf tea, juice) , increasing water from 40 oz to 64 oz.      Time  4    Period  Weeks    Status  On-going      PT LONG TERM GOAL #2   Title  Pt will demo decreased R posterior mm tightness, increased hip ext on R from  4-/5 to 5/5, no pelvic obliquities in order to eliminate bowels and minimzie risk for worsening of prolapse.    Time  6    Period  Weeks    Status  On-going      PT LONG TERM GOAL #3   Title  Pt will demo proper pelvic floor coordination and contraction to minimize straining with bowel movement and improve LBP     Time  8    Period  Weeks    Status  On-going      PT LONG TERM GOAL #4   Title  Pt will decrease her ODI score from 36 % to <18 % in order to minimize LBP to vaccuum     Time  10    Period  Weeks    Status  On-going      PT LONG TERM GOAL #5   Title  Pt will decrease her PFIQ7 score from 62 % to <30 % in order to improve pelvic function  and decrease urge    Time  10    Period  Weeks    Status  On-going      PT LONG TERM GOAL #6   Title  Pt will demo improved body mechanics with vaccumming, proper technique with deep core strengthening to perform ADLs    Time  2    Period  Weeks    Status  On-going      PT LONG TERM GOAL #7   Title  Pt will report Type 4 stool type without straining across 75% of her bowel movements and more regular bowel movements every other or  daily in order to minimize straining. worsening of prolapse     Time  10    Period  Weeks    Status  On-going      PT LONG TERM GOAL #8   Title  Pt will report improved pelvic floor contraction/ coordinaiton in order to gain no difficulty with initiating urination and less feeling of prolapse     Time  6    Period  Weeks    Status  On-going            Plan - 09/12/18 1223    Clinical Impression Statement  Pt responded well to last session as pt reported not feeling her prolapse after leaving the morning session until the end of the day. Progressed to B hip ext strengthening, hamstring stretches, floor <> stand transfers. Positions were selected to minimize pelvic floor strain. Added hamstring stretches into HEP today. Plan to discuss well-rounded routine with use of recumbent bike which pt is ordering. Pt provided education with anatomy/ physiology.  Pt continues to benefit from skilled PT.    Personal Factors and Comorbidities  Age    Stability/Clinical Decision Making  Stable/Uncomplicated    Rehab Potential  Good    PT Frequency  1x / week    PT Duration  Other (comment)   10   PT Treatment/Interventions  Neuromuscular re-education;Therapeutic activities;Therapeutic exercise;Patient/family education;Manual techniques;Moist Heat;Scar mobilization;Passive range of motion;Gait training    Consulted and Agree with Plan of Care  Patient       Patient will benefit from skilled therapeutic intervention in order to improve the following deficits and impairments:  Decreased safety awareness, Decreased activity tolerance, Decreased strength, Decreased range of motion, Improper body mechanics, Postural dysfunction, Hypomobility, Increased muscle spasms, Decreased endurance  Visit Diagnosis: 1. Chronic low back pain with left-sided sciatica, unspecified back pain laterality   2.  Other muscle spasm   3. Other lack of coordination   4. Other abnormalities of gait and mobility         Problem List Patient Active Problem List   Diagnosis Date Noted  . Allergic reaction 06/09/2017  . DIVERTICULITIS, COLON 02/05/2009  . IBS 02/05/2009    Jerl Mina ,PT, DPT, E-RYT  09/12/2018, 12:25 PM  [place] MAIN [facility] SERVICES [address] [place], [place], [address] Phone: [phone]   Fax:  [phone]  Name: Kristi Maldonado MRN: [id number] Date of Birth: Jun 18, 1940

## 2018-09-12 NOTE — Patient Instructions (Signed)
Hamstring stretches with strap  Shoulders, elbows down,  Knees bend and straighten ( 10 reps)  Knees pointed slightly out, and straighten (10reps)    ___  Seated version  10 reps   ___  half kneeling transitions to and from the floor  Add mini standing lunges with hand on dresser or table  20 x reps   Start with ski track stance, step back with heel/ toes turned straight, front knee above ankle without moving as your back knee bends and straightens  Keep pelvis and trunk centered between legs without shifting more to the front legs       Transition from standing to floor: Wide squat like you are about to pick something up from the floor --> crawl hand down on thigh and then reach other hand onto the ground (all fours)  To get up, all fours--> lifts hips in to Downward Facing Dog  and walk hands backwards to feet --> mini quat --> hands on thighs, then hips then pause HERE  to avoid (moving too quickly up/ blood rush) -->  knees glide forward and roll  Hips up instead of hinging spine up

## 2018-09-18 ENCOUNTER — Ambulatory Visit: Payer: Medicare Other | Admitting: Physical Therapy

## 2018-09-19 ENCOUNTER — Ambulatory Visit: Payer: Medicare Other | Admitting: Physical Therapy

## 2018-09-19 ENCOUNTER — Other Ambulatory Visit: Payer: Self-pay

## 2018-09-19 DIAGNOSIS — R278 Other lack of coordination: Secondary | ICD-10-CM

## 2018-09-19 DIAGNOSIS — M5442 Lumbago with sciatica, left side: Secondary | ICD-10-CM | POA: Diagnosis not present

## 2018-09-19 DIAGNOSIS — R2689 Other abnormalities of gait and mobility: Secondary | ICD-10-CM

## 2018-09-19 DIAGNOSIS — M62838 Other muscle spasm: Secondary | ICD-10-CM

## 2018-09-19 DIAGNOSIS — G8929 Other chronic pain: Secondary | ICD-10-CM

## 2018-09-19 NOTE — Therapy (Signed)
[place] [facility] [address] [place], [place], [address] Phone: [phone]   Fax:  [phone]  Physical Therapy Treatment  Patient Details  Name: Kristi BadgerBarbara Maldonado MRN: [id number] Date of Birth: January 07, 1941 Referring Provider (PT): Dalbert GarnetBeasley    Encounter Date: 09/19/2018  PT End of Session - 09/19/18 1740    Visit Number  8    Number of Visits  10    Date for PT Re-Evaluation  10/02/18    PT Start Time  1000    PT Stop Time  1100    PT Time Calculation (min)  60 min    Activity Tolerance  Patient tolerated treatment well;No increased pain    Behavior During Therapy  WFL for tasks assessed/performed       Past Medical History:  Diagnosis Date  . Difficult airway for intubation    Patient told by anesthesiology that she should tell future providers to always use a pediatric ET tube due to narrow airway and prior surgical issues including scarring  . Hypertension   . Thyroid disease     Past Surgical History:  Procedure Laterality Date  . SALIVARY GLAND SURGERY    . THYROID SURGERY      There were no vitals filed for this visit.  Subjective Assessment - 09/19/18 1003    Subjective  Pt had a pessary inserted and she feels it will be helpful. Pt is using body mecahnics which has been helpful.  Pt's pressure feeling has been less. Pt is still working on sleeping on her back. Pt starts on her side with pillows between her knees and finds herself on her back when waking up. Pt is still having difficulty with stand to floor. Marlane Mingle.         [facility] PT Assessment - 09/19/18 1509      Observation/Other Assessments   Observations  R hallux valgus, bunion       Palpation   Palpation comment  significant tightness at R hamstrings, adductors, tight between ray I-II  ( post Tx decreased)                    [facility] Adult PT Treatment/Exercise - 09/19/18 1510      Neuro Re-ed    Neuro Re-ed Details   cued for proper technique  in birddog and standing birddog without BUE       Modalities   Modalities  Moist Heat      Moist Heat Therapy   Number Minutes Moist Heat  5 Minutes    Moist Heat Location  --   adductor, hamstring     Manual Therapy   Manual therapy comments  STM/MWM, traction, jostle at R hamstring/ adductor , extensor digitorium/ lumbrical between ray I-II on R foot                   PT Long Term Goals - 08/22/18 2109      PT LONG TERM GOAL #1   Title  Balance out bladder irritant  to water by decreasing 50% of bladder irritants  ( half odf decaf tea, juice) , increasing water from 40 oz to 64 oz.      Time  4    Period  Weeks    Status  On-going      PT LONG TERM GOAL #2   Title  Pt will demo decreased R posterior mm tightness, increased hip ext on R from 4-/5 to 5/5, no pelvic  obliquities in order to eliminate bowels and minimzie risk for worsening of prolapse.    Time  6    Period  Weeks    Status  On-going      PT LONG TERM GOAL #3   Title  Pt will demo proper pelvic floor coordination and contraction to minimize straining with bowel movement and improve LBP     Time  8    Period  Weeks    Status  On-going      PT LONG TERM GOAL #4   Title  Pt will decrease her ODI score from 36 % to <18 % in order to minimize LBP to vaccuum     Time  10    Period  Weeks    Status  On-going      PT LONG TERM GOAL #5   Title  Pt will decrease her PFIQ7 score from 62 % to <30 % in order to improve pelvic function  and decrease urge    Time  10    Period  Weeks    Status  On-going      PT LONG TERM GOAL #6   Title  Pt will demo improved body mechanics with vaccumming, proper technique with deep core strengthening to perform ADLs    Time  2    Period  Weeks    Status  On-going      PT LONG TERM GOAL #7   Title  Pt will report Type 4 stool type without straining across 75% of her bowel movements and more regular bowel movements every other or daily in order to minimize straining.  worsening of prolapse     Time  10    Period  Weeks    Status  On-going      PT LONG TERM GOAL #8   Title  Pt will report improved pelvic floor contraction/ coordinaiton in order to gain no difficulty with initiating urination and less feeling of prolapse     Time  6    Period  Weeks    Status  On-going            Plan - 09/19/18 1742    Clinical Impression Statement  Pt states she is hopeful with the use of the pessary which she had inserted last week. Applied regional interdependent approach to decrease R sided hamstring/ adductor mm tightness which is likely assoociated with R hallux valgus and decreased transverse co-activation and glut strength. Plan to progress with further strengthening and closed kinetic chain strengthening.  Pt continues to benefit from skilled PT.   Personal Factors and Comorbidities  Age    Stability/Clinical Decision Making  Stable/Uncomplicated    Rehab Potential  Good    PT Frequency  1x / week    PT Duration  Other (comment)   10   PT Treatment/Interventions  Neuromuscular re-education;Therapeutic activities;Therapeutic exercise;Patient/family education;Manual techniques;Moist Heat;Scar mobilization;Passive range of motion;Gait training    Consulted and Agree with Plan of Care  Patient       Patient will benefit from skilled therapeutic intervention in order to improve the following deficits and impairments:  Decreased safety awareness, Decreased activity tolerance, Decreased strength, Decreased range of motion, Improper body mechanics, Postural dysfunction, Hypomobility, Increased muscle spasms, Decreased endurance  Visit Diagnosis: 1. Chronic low back pain with left-sided sciatica, unspecified back pain laterality   2. Other muscle spasm   3. Other lack of coordination   4. Other abnormalities of gait and mobility  Problem List Patient Active Problem List   Diagnosis Date Noted  . Allergic reaction 06/09/2017  . DIVERTICULITIS,  COLON 02/05/2009  . IBS 02/05/2009    Jerl Mina ,PT, DPT, E-RYT  09/19/2018, 5:43 PM  [place] MAIN [facility] SERVICES [address] [place], [place], [address] Phone: [phone]   Fax:  [phone]  Name: Kristi Maldonado MRN: [id number] Date of Birth: December 12, 1940

## 2018-09-19 NOTE — Patient Instructions (Addendum)
Reviewed hamstring stretches   Reviewed birddog ( no use of arms)  Added standing birddog at counter ( no use of arms) cued for knee behind ankle of front leg

## 2018-09-26 ENCOUNTER — Other Ambulatory Visit: Payer: Self-pay

## 2018-09-26 ENCOUNTER — Ambulatory Visit: Payer: Medicare Other | Admitting: Physical Therapy

## 2018-09-26 DIAGNOSIS — M5442 Lumbago with sciatica, left side: Secondary | ICD-10-CM | POA: Diagnosis not present

## 2018-09-26 DIAGNOSIS — G8929 Other chronic pain: Secondary | ICD-10-CM

## 2018-09-26 DIAGNOSIS — R2689 Other abnormalities of gait and mobility: Secondary | ICD-10-CM

## 2018-09-26 DIAGNOSIS — R278 Other lack of coordination: Secondary | ICD-10-CM

## 2018-09-26 DIAGNOSIS — M62838 Other muscle spasm: Secondary | ICD-10-CM

## 2018-09-26 NOTE — Therapy (Signed)
[place] MAIN [facility] SERVICES [address] [place], [place], [address] Phone: [phone]   Fax:  [phone]  Physical Therapy Treatment / Progress Note  07/24/18- 09/25/28 -   9 visits   Patient Details  Name: Margurete Guaman MRN: [id number] Date of Birth: [birth year] Referring Provider (PT): Leafy Ro    Encounter Date: 09/26/2018  PT End of Session - 09/26/18 0951    Visit Number  9    Number of Visits  --    Date for PT Re-Evaluation  12/05/18    Authorization Type  recert [employer] note submitted at 9th visit    PT Start Time  1000    PT Stop Time  1130    PT Time Calculation (min)  90 min    Activity Tolerance  Patient tolerated treatment well;No increased pain    Behavior During Therapy  WFL for tasks assessed/performed       Past Medical History:  Diagnosis Date  . Difficult airway for intubation    Patient told by anesthesiology that she should tell future providers to always use a pediatric ET tube due to narrow airway and prior surgical issues including scarring  . Hypertension   . Thyroid disease     Past Surgical History:  Procedure Laterality Date  . SALIVARY GLAND SURGERY    . THYROID SURGERY      There were no vitals filed for this visit.  Subjective Assessment - 09/26/18 0950    Subjective  Pt got her recumbent bike .  Pt tried to get out her pessary because bladder extended beyond the pessary but she could not get it out.  Pt felt frustrated about that.  Pt returned to her gynecologist and she helped it readjust it. Pt is no longer having difficultyemptying completely. Pt is having more leakage and has been wearing a pad everyday.         [facility] PT Assessment - 09/26/18 1015      Floor to Stand   Comments  cued for wider hand position, no shoulder IR, mini squat.       Strength   Overall Strength Comments  hip abd B 5/5 , hip ext L5/5 R 4-/5, ,  hip flex 5/5 knee flex 5/5 , knee ext 5/5 B         Palpation   Spinal mobility  R posterior intercostals/ medial scapula tightness    SI assessment   hypomobility lacking sacral nutation on R lateral border, tighness at hip abd mm groups ( post Tx: improved )     Palpation comment  no tightness at R hamstrings, adductors, tight between ray I-II  ( post Tx decreased)                    [facility] Adult PT Treatment/Exercise - 09/26/18 1217      Therapeutic Activites    Other Therapeutic Activities  ergonomics, floor <> stand transfers cues      Neuro Re-ed    Neuro Re-ed Details   cued for proper alignment and technique with floor<> stand t/f,  piriformis stretch with proper technique to maintain upright posture / correct slumped posture       Modalities   Modalities  Moist Heat      Moist Heat Therapy   Number Minutes Moist Heat  10 Minutes    Moist Heat Location  --   buttock/ thoracic during treatment      Manual Therapy  Manual therapy comments  PA mob/ MWM  at R SIJ border base to apex, GradeII mob, STM/MWM at hip abduction mm R    STM/MWM at R posterior intercostals/ medial scapula                  PT Long Term Goals - 09/26/18 0951      PT LONG TERM GOAL #1   Title  Balance out bladder irritant  to water by decreasing 50% of bladder irritants  ( half odf decaf tea, juice) , increasing water from 40 oz to 64 oz.      Time  4    Period  Weeks    Status  Achieved      PT LONG TERM GOAL #2   Title  Pt will demo decreased R posterior mm tightness, increased hip ext on R from 4-/5 to 5/5, no pelvic obliquities in order to eliminate bowels and minimzie risk for worsening of prolapse.  ( 7/23: R SIJ tightness remains but hip ext 5/5 stronger, and no pelvic obliquity present )    Time  6    Period  Weeks    Status  Partially Met      PT LONG TERM GOAL #3   Title  Pt will demo proper pelvic floor coordination and contraction to minimize straining with bowel movement and improve LBP     Time  8    Period  Weeks     Status  Achieved      PT LONG TERM GOAL #4   Title  Pt will decrease her ODI score from 36 % to <18 % in order to minimize LBP to vaccuum  ( 7:23: 34%)    Time  10    Period  Weeks    Status  On-going      PT LONG TERM GOAL #5   Title  Pt will decrease her PFIQ7 score from 62 % to <30 % in order to improve pelvic function  and decrease urge ( 7/23:41%)    Time  10    Period  Weeks    Status  Partially Met      Additional Long Term Goals   Additional Long Term Goals  Yes      PT LONG TERM GOAL #6   Title  Pt will demo improved body mechanics with vaccumming, proper technique with deep core strengthening to perform ADLs    Time  2    Period  Weeks    Status  Achieved      PT LONG TERM GOAL #7   Title  Pt will report Type 4 stool type without straining across 75% of her bowel movements and more regular bowel movements every other or daily in order to minimize straining. worsening of prolapse     Time  10    Period  Weeks    Status  Achieved      PT LONG TERM GOAL #8   Title  Pt will report improved pelvic floor contraction/ coordinaiton in order to gain no difficulty with initiating urination and less feeling of prolapse     Time  6    Period  Weeks    Status  Achieved      PT LONG TERM GOAL  #9   TITLE  Pt will demo no cues required with stand<> floor technique using downward facing dog ( 4 point contact/ mini squat ) to mop floor, retrieve objects from floor minimize pelvic obliquity/ slumped  posture/ shoulder IR and decrease straining pelvic floor .    Time  10    Period  Weeks    Status  New    Target Date  12/05/18            Plan - 09/26/18 1220    Clinical Impression Statement  Pt has achieved 5/9 goals across 9 visits. Pt has demo'd significantly improved pelvic alignment, pelvic/SIJ/spinal mobility, along with deep core /hip strength. Pt has improved bowel movements with less straining, increased water intak. Both improvements helping with minimize worsening of  prolapse and optimize bladder health. Pt is able to complete urination without difficulty. Pt currently is getting adjusted with pessary use and has been encouraged to continue with deep core and pelvic strengthening exercises. Currently working on increasing R leg/ sacral mobility and hamstring / spinal mm mobility to facilitate floor<> floor transfer to minimize straining pelvic floor and reoccurrence of pelvic obliquities. Pt's LBP has also improved. Pt remains compliance with body mechanics training. Manual Tx today addressed R thoracic tightness and limited nutation of R sacrum/ R posterior glut tightness. Mobility increased post Tx. Practiced floor <> stand t/f with cues to less shoulder IR and more scapular stabilization which will continue to promote better posture for deep core system function and in turn, minimize relapse of LBP, urinary/ bowel, prolapse problems. Pt continues to benefit from skilled PT with regional interdependent approach and progression to recumbent biking/ flexibility program to maintain fitness and wellness .          Personal Factors and Comorbidities  Age    Stability/Clinical Decision Making  Stable/Uncomplicated    Rehab Potential  Good    PT Frequency  1x / week    PT Duration  Other (comment)   10   PT Treatment/Interventions  Neuromuscular re-education;Therapeutic activities;Therapeutic exercise;Patient/family education;Manual techniques;Moist Heat;Scar mobilization;Passive range of motion;Gait training    Consulted and Agree with Plan of Care  Patient       Patient will benefit from skilled therapeutic intervention in order to improve the following deficits and impairments:  Decreased safety awareness, Decreased activity tolerance, Decreased strength, Decreased range of motion, Improper body mechanics, Postural dysfunction, Hypomobility, Increased muscle spasms, Decreased endurance  Visit Diagnosis: 1. Chronic low back pain with left-sided sciatica,  unspecified back pain laterality   2. Other muscle spasm   3. Other lack of coordination   4. Other abnormalities of gait and mobility        Problem List Patient Active Problem List   Diagnosis Date Noted  . Allergic reaction 06/09/2017  . DIVERTICULITIS, COLON 02/05/2009  . IBS 02/05/2009    Jerl Mina ,PT, DPT, E-RYT  09/26/2018, 12:31 PM  [place] MAIN [facility] SERVICES [address] [place], [place], [address] Phone: [phone]   Fax:  [phone]  Name: Tomiko Schoon MRN: [id number] Date of Birth: Dec 18, 1940

## 2018-09-26 NOTE — Patient Instructions (Addendum)
S tretches after walking and biking    Figure 4  Hands back behind you on chair, head and spine aligned. Bend at the waist   5 breaths   DO NOT press the knee down   _  Cross R thigh over outer thigh  L hand on R thigh , R hand by side on chair,  Inhale, lengthen tall Exhale look R  Turn to R and look over your shoulder     10 reps  ____   Practice the transfer to the floor with this   downdog by the edge of bed  Hands shoulders width, should back/ elbows Knees bent. heel up slightly Mini squat   Elbows back,   _____  Ergonomic handout

## 2018-10-03 ENCOUNTER — Other Ambulatory Visit: Payer: Self-pay

## 2018-10-03 ENCOUNTER — Ambulatory Visit: Payer: Medicare Other | Admitting: Physical Therapy

## 2018-10-03 DIAGNOSIS — R278 Other lack of coordination: Secondary | ICD-10-CM

## 2018-10-03 DIAGNOSIS — M5442 Lumbago with sciatica, left side: Secondary | ICD-10-CM

## 2018-10-03 DIAGNOSIS — G8929 Other chronic pain: Secondary | ICD-10-CM

## 2018-10-03 DIAGNOSIS — M62838 Other muscle spasm: Secondary | ICD-10-CM

## 2018-10-03 DIAGNOSIS — R2689 Other abnormalities of gait and mobility: Secondary | ICD-10-CM

## 2018-10-03 NOTE — Patient Instructions (Signed)
Prone Heel Press for strengthening sacro-iliac joints  1. Lie on your belly. If you have an arch in your low back or it feels umcomfortable, place a pillow under your low belly/hips to make sure your low back feel comfortable.   2. Place our forehead on top of your palms.      Widen your knees apart for starting position.   3. Inhale, feel belly and low back expand  4. Exhale, feel belly hug in, press heel together and count aloud for 5 sec. Then relax the heel squeezing.  Perform 10 reps of 5 sec holds. 2 sets/ day.    If you feel entire buttock tighten too much or feel low back pain, apply 50% less effort. As you press your heel together, you will feel as if your pubic bone (front of your pelvis) and sacrum (back of your pelvis) gentle move towards each other or your low abdominal muscles hug in more.  ___  Ardine Eng pose rocking    ____  Deep core level 1 and 2    ____    Standing:  10 reps on both sides x 3 x day     3 point tap   Feet are hip width Tap forward, center under hip not feet next to each other  Tap middle\, center  Tap back

## 2018-10-03 NOTE — Therapy (Signed)
[place] MAIN [facility] SERVICES [address] [place], [place], [address] Phone: [phone]   Fax:  [phone]  Physical Therapy Treatment  Patient Details  Name: Kristi Maldonado MRN: [id number] Date of Birth: 10-30-1940 Referring Provider (PT): Kristi Maldonado    Encounter Date: 10/03/2018  PT End of Session - 10/03/18 1413    Visit Number  10    Date for PT Re-Evaluation  12/05/18    Authorization Type  recert [employer] note to submit at 19th visit    PT Start Time  1002    PT Stop Time  1100    PT Time Calculation (min)  58 min    Activity Tolerance  Patient tolerated treatment well;No increased pain    Behavior During Therapy  WFL for tasks assessed/performed       Past Medical History:  Diagnosis Date  . Difficult airway for intubation    Patient told by anesthesiology that she should tell future providers to always use a pediatric ET tube due to narrow airway and prior surgical issues including scarring  . Hypertension   . Thyroid disease     Past Surgical History:  Procedure Laterality Date  . SALIVARY GLAND SURGERY    . THYROID SURGERY      There were no vitals filed for this visit.  Subjective Assessment - 10/03/18 1005    Subjective  Pt reports she L hip pain that started 4 days after last session. Pt did not do any different activities. Sometimes it comes on when walking and it causes her to lean forward to adjust to the pain.  Pt's pessary was adjusted the week before last tsession when it got twisted 09/19/18. It was able to get it adjusted again yesterday but it still feels uncomfortable. Gynecologist is going to help her with a different pessary or a different size. Pt has stopped  doing all of her exercises. Kristi Maldonado PT Assessment - 10/03/18 1015      Palpation   SI assessment   L SIJ limited, sacral nutation      Palpation comment  standing: R  iliac crest higher ( Post Tx: levelled iliac crest) , sidelying, painful  to palpation at L SIJ,        Bed Mobility   Bed Mobility  --   painful on back. Tx deferred to sidelying/ prone      Ambulation/Gait   Gait Comments  pre Tx: slowed antalgic gait,flexed spine,  ( Post Tx: non-antalgic gait, longer sride, more upright)                    [facility] Adult PT Treatment/Exercise - 10/03/18 1357      Neuro Re-ed    Neuro Re-ed Details   cued for new HEP for lower kinetic chain propioception and pelvic stability and glut strengthening        Modalities   Modalities  Moist Heat      Moist Heat Therapy   Number Minutes Moist Heat  10 Minutes    Moist Heat Location  Other (comment)   sacrum during review of deep core      Manual Therapy   Manual therapy comments  distraction on LLE,  rotational mob, inferior glide on sacrum, MWM/STM allong L SIJ  , PA Mob Grade II at base of sacrum  PT Long Term Goals - 09/26/18 0951      PT LONG TERM GOAL #1   Title  Balance out bladder irritant  to water by decreasing 50% of bladder irritants  ( half odf decaf tea, juice) , increasing water from 40 oz to 64 oz.      Time  4    Period  Weeks    Status  Achieved      PT LONG TERM GOAL #2   Title  Pt will demo decreased R posterior mm tightness, increased hip ext on R from 4-/5 to 5/5, no pelvic obliquities in order to eliminate bowels and minimzie risk for worsening of prolapse.  ( 7/23: R SIJ tightness remains but hip ext 5/5 stronger, and no pelvic obliquity present )    Time  6    Period  Weeks    Status  Partially Met      PT LONG TERM GOAL #3   Title  Pt will demo proper pelvic floor coordination and contraction to minimize straining with bowel movement and improve LBP     Time  8    Period  Weeks    Status  Achieved      PT LONG TERM GOAL #4   Title  Pt will decrease her ODI score from 36 % to <18 % in order to minimize LBP to vaccuum  ( 7:23: 34%)    Time  10    Period  Weeks    Status  On-going      PT LONG TERM  GOAL #5   Title  Pt will decrease her PFIQ7 score from 62 % to <30 % in order to improve pelvic function  and decrease urge ( 7/23:41%)    Time  10    Period  Weeks    Status  Partially Met      Additional Long Term Goals   Additional Long Term Goals  Yes      PT LONG TERM GOAL #6   Title  Pt will demo improved body mechanics with vaccumming, proper technique with deep core strengthening to perform ADLs    Time  2    Period  Weeks    Status  Achieved      PT LONG TERM GOAL #7   Title  Pt will report Type 4 stool type without straining across 75% of her bowel movements and more regular bowel movements every other or daily in order to minimize straining. worsening of prolapse     Time  10    Period  Weeks    Status  Achieved      PT LONG TERM GOAL #8   Title  Pt will report improved pelvic floor contraction/ coordinaiton in order to gain no difficulty with initiating urination and less feeling of prolapse     Time  6    Period  Weeks    Status  Achieved      PT LONG TERM GOAL  #9   TITLE  Pt will demo no cues required with stand<> floor technique using downward facing dog ( 4 point contact/ mini squat ) to mop floor, retrieve objects from floor minimize pelvic obliquity/ slumped posture/ shoulder IR and decrease straining pelvic floor .    Time  10    Period  Weeks    Status  New    Target Date  12/05/18            Plan - 10/03/18 1414  Clinical Impression Statement Pt demo'd compensatory gait pattern due to discomfort of pessary being shifted out of place which is likely the connection to her new occurrence of her L lateral hip pain over the past few days. Pt demo'd asymmetrically aligned pelvic girdle which was corrected with manual Tx. Pt tolerated Tx without pain. Pt reported significantly relief of L lateral hip pain and was able to walk with less pain post Tx. Pt continues to benefit from skilled PT.   Personal Factors and Comorbidities  Age    Stability/Clinical  Decision Making  Stable/Uncomplicated    Rehab Potential  Good    PT Frequency  1x / week    PT Duration  Other (comment)   10   PT Treatment/Interventions  Neuromuscular re-education;Therapeutic activities;Therapeutic exercise;Patient/family education;Manual techniques;Moist Heat;Scar mobilization;Passive range of motion;Gait training    Consulted and Agree with Plan of Care  Patient       Patient will benefit from skilled therapeutic intervention in order to improve the following deficits and impairments:  Decreased safety awareness, Decreased activity tolerance, Decreased strength, Decreased range of motion, Improper body mechanics, Postural dysfunction, Hypomobility, Increased muscle spasms, Decreased endurance  Visit Diagnosis: 1. Chronic low back pain with left-sided sciatica, unspecified back pain laterality   2. Other muscle spasm   3. Other lack of coordination   4. Other abnormalities of gait and mobility        Problem List Patient Active Problem List   Diagnosis Date Noted  . Allergic reaction 06/09/2017  . DIVERTICULITIS, COLON 02/05/2009  . IBS 02/05/2009    Jerl Mina ,PT, DPT, E-RYT  10/03/2018, 2:15 PM  [place] MAIN [facility] SERVICES [address] [place], [place], [address] Phone: [phone]   Fax:  [phone]  Name: Kristi Maldonado MRN: [id number] Date of Birth: [birth year]

## 2018-10-08 ENCOUNTER — Ambulatory Visit: Payer: Medicare Other | Attending: Obstetrics and Gynecology | Admitting: Physical Therapy

## 2018-10-08 ENCOUNTER — Other Ambulatory Visit: Payer: Self-pay

## 2018-10-08 DIAGNOSIS — M5442 Lumbago with sciatica, left side: Secondary | ICD-10-CM | POA: Diagnosis present

## 2018-10-08 DIAGNOSIS — R2689 Other abnormalities of gait and mobility: Secondary | ICD-10-CM

## 2018-10-08 DIAGNOSIS — M62838 Other muscle spasm: Secondary | ICD-10-CM | POA: Diagnosis present

## 2018-10-08 DIAGNOSIS — M533 Sacrococcygeal disorders, not elsewhere classified: Secondary | ICD-10-CM | POA: Insufficient documentation

## 2018-10-08 DIAGNOSIS — R278 Other lack of coordination: Secondary | ICD-10-CM | POA: Insufficient documentation

## 2018-10-08 DIAGNOSIS — G8929 Other chronic pain: Secondary | ICD-10-CM | POA: Insufficient documentation

## 2018-10-08 NOTE — Patient Instructions (Signed)
Scoliosis stretches :  Sidelying on L Pulling mattress, elbow turn R ( lengtehn R flank, shoulder blade down and back)  10reps    Open Book  *( handout)  Rotating  __  At the counter   Lengthen back, mini squat, trunk is parallel   ( R arm under, hand on L thigh   5 breaths   Lengthen ,  Repeat 10 reps    ___  Step up 10 reps / down  In corner    ___ Walk with higher thighs  Arm swings

## 2018-10-08 NOTE — Therapy (Signed)
[place] MAIN [facility] SERVICES [address] [place], [place], [address] Phone: [phone]   Fax:  [phone]  Physical Therapy Treatment  Patient Details  Name: Kristi Maldonado MRN: [id number] Date of Birth: 25-Feb-1941 Referring Provider (PT): Leafy Ro    Encounter Date: 10/08/2018  PT End of Session - 10/08/18 1503    Visit Number  11    Date for PT Re-Evaluation  12/05/18    Authorization Type  recert [employer]ide note to submit at 19th visit    PT Start Time  1400    PT Stop Time  1510    PT Time Calculation (min)  70 min    Activity Tolerance  Patient tolerated treatment well;No increased pain    Behavior During Therapy  WFL for tasks assessed/performed       Past Medical History:  Diagnosis Date  . Difficult airway for intubation    Patient told by anesthesiology that she should tell future providers to always use a pediatric ET tube due to narrow airway and prior surgical issues including scarring  . Hypertension   . Thyroid disease     Past Surgical History:  Procedure Laterality Date  . SALIVARY GLAND SURGERY    . THYROID SURGERY      There were no vitals filed for this visit.  Subjective Assessment - 10/08/18 1503    Subjective  After last session, pt noticed she felt ok until the end of the day. The L hip started all over again. Pt has noticed a catch in L hip when walking         [facility] PT Assessment - 10/08/18 1504      Observation/Other Assessments   Observations  R shoulder lowered, R lumbar convex       AROM   Overall AROM Comments  L twist/ sideflexion with L hip pain       Palpation   Spinal mobility  posterior curve with R paraspinal lumbar with R throax rotation     SI assessment   SIJ mobility restored , aligned     Palpation comment  R scapula delayed in downward  mobility                    [facility] Adult PT Treatment/Exercise - 10/08/18 1752      Exercises   Exercises  --   cued alignment/  technique scoliosis ex, step up /down 10 rep.      Modalities   Modalities  Moist Heat      Moist Heat Therapy   Number Minutes Moist Heat  10 Minutes    Moist Heat Location  Lumbar Spine;Hip      Manual Therapy   Manual therapy comments  sidelying:                  PT Long Term Goals - 09/26/18 0951      PT LONG TERM GOAL #1   Title  Balance out bladder irritant  to water by decreasing 50% of bladder irritants  ( half odf decaf tea, juice) , increasing water from 40 oz to 64 oz.      Time  4    Period  Weeks    Status  Achieved      PT LONG TERM GOAL #2   Title  Pt will demo decreased R posterior mm tightness, increased hip ext on R from 4-/5 to 5/5, no pelvic obliquities in order to eliminate bowels and minimzie  risk for worsening of prolapse.  ( 7/23: R SIJ tightness remains but hip ext 5/5 stronger, and no pelvic obliquity present )    Time  6    Period  Weeks    Status  Partially Met      PT LONG TERM GOAL #3   Title  Pt will demo proper pelvic floor coordination and contraction to minimize straining with bowel movement and improve LBP     Time  8    Period  Weeks    Status  Achieved      PT LONG TERM GOAL #4   Title  Pt will decrease her ODI score from 36 % to <18 % in order to minimize LBP to vaccuum  ( 7:23: 34%)    Time  10    Period  Weeks    Status  On-going      PT LONG TERM GOAL #5   Title  Pt will decrease her PFIQ7 score from 62 % to <30 % in order to improve pelvic function  and decrease urge ( 7/23:41%)    Time  10    Period  Weeks    Status  Partially Met      Additional Long Term Goals   Additional Long Term Goals  Yes      PT LONG TERM GOAL #6   Title  Pt will demo improved body mechanics with vaccumming, proper technique with deep core strengthening to perform ADLs    Time  2    Period  Weeks    Status  Achieved      PT LONG TERM GOAL #7   Title  Pt will report Type 4 stool type without straining across 75% of her bowel movements  and more regular bowel movements every other or daily in order to minimize straining. worsening of prolapse     Time  10    Period  Weeks    Status  Achieved      PT LONG TERM GOAL #8   Title  Pt will report improved pelvic floor contraction/ coordinaiton in order to gain no difficulty with initiating urination and less feeling of prolapse     Time  6    Period  Weeks    Status  Achieved      PT LONG TERM GOAL  #9   TITLE  Pt will demo no cues required with stand<> floor technique using downward facing dog ( 4 point contact/ mini squat ) to mop floor, retrieve objects from floor minimize pelvic obliquity/ slumped posture/ shoulder IR and decrease straining pelvic floor .    Time  10    Period  Weeks    Status  New    Target Date  12/05/18            Plan - 10/08/18 1751    Clinical Impression Statement  Addressed pt's mild thoracolumbar scoliosis of lumbar spine ( posterior rotation at lumbar, lowered R shoulder) with manual Tx which decreased pt's L hip pain and the catch she reported pre Tx. Plan to continue with strengthening gluts and maintaining femural head mobility in acetabulum along with scoliosis -specific stretching/ strengthening for spine to minimize issues. Pt's gait and step-up/down improved post Tx. Pt continues to benefit from skilled PT   Personal Factors and Comorbidities  Age    Stability/Clinical Decision Making  Stable/Uncomplicated    Rehab Potential  Good    PT Frequency  1x / week  PT Duration  Other (comment)   10   PT Treatment/Interventions  Neuromuscular re-education;Therapeutic activities;Therapeutic exercise;Patient/family education;Manual techniques;Moist Heat;Scar mobilization;Passive range of motion;Gait training    Consulted and Agree with Plan of Care  Patient       Patient will benefit from skilled therapeutic intervention in order to improve the following deficits and impairments:  Decreased safety awareness, Decreased activity tolerance,  Decreased strength, Decreased range of motion, Improper body mechanics, Postural dysfunction, Hypomobility, Increased muscle spasms, Decreased endurance  Visit Diagnosis: 1. Chronic low back pain with left-sided sciatica, unspecified back pain laterality   2. Other muscle spasm   3. Other lack of coordination   4. Other abnormalities of gait and mobility        Problem List Patient Active Problem List   Diagnosis Date Noted  . Allergic reaction 06/09/2017  . DIVERTICULITIS, COLON 02/05/2009  . IBS 02/05/2009    Jerl Mina ,PT, DPT, E-RYT  10/08/2018, 5:58 PM  [place] MAIN [facility] SERVICES [address] [place], [place], [address] Phone: [phone]   Fax:  [phone]  Name: Kristi Maldonado MRN: [id number] Date of Birth: Mar 05, 1941

## 2018-10-10 ENCOUNTER — Ambulatory Visit: Payer: Medicare Other | Admitting: Physical Therapy

## 2018-10-16 ENCOUNTER — Ambulatory Visit: Payer: Medicare Other | Admitting: Physical Therapy

## 2018-10-24 ENCOUNTER — Ambulatory Visit: Payer: Medicare Other | Admitting: Physical Therapy

## 2018-10-24 ENCOUNTER — Other Ambulatory Visit: Payer: Self-pay

## 2018-10-24 DIAGNOSIS — M62838 Other muscle spasm: Secondary | ICD-10-CM

## 2018-10-24 DIAGNOSIS — G8929 Other chronic pain: Secondary | ICD-10-CM

## 2018-10-24 DIAGNOSIS — R278 Other lack of coordination: Secondary | ICD-10-CM

## 2018-10-24 DIAGNOSIS — M5442 Lumbago with sciatica, left side: Secondary | ICD-10-CM

## 2018-10-24 DIAGNOSIS — R2689 Other abnormalities of gait and mobility: Secondary | ICD-10-CM

## 2018-10-24 NOTE — Therapy (Signed)
[place] MAIN [facility] SERVICES [address] [place], [place], [address] Phone: [phone]   Fax:  [phone]  Physical Therapy Treatment  Patient Details  Name: Kristi Maldonado MRN: [id number] Date of Birth: [birth year] Referring Provider (PT): Leafy Ro    Encounter Date: 10/24/2018  PT End of Session - 10/24/18 1022    Visit Number  12    Date for PT Re-Evaluation  12/05/18    Authorization Type  recert [employer] note to submit at 19th visit    PT Start Time  1010    PT Stop Time  1110    PT Time Calculation (min)  60 min    Activity Tolerance  Patient tolerated treatment well;No increased pain    Behavior During Therapy  WFL for tasks assessed/performed       Past Medical History:  Diagnosis Date  . Difficult airway for intubation    Patient told by anesthesiology that she should tell future providers to always use a pediatric ET tube due to narrow airway and prior surgical issues including scarring  . Hypertension   . Thyroid disease     Past Surgical History:  Procedure Laterality Date  . SALIVARY GLAND SURGERY    . THYROID SURGERY      There were no vitals filed for this visit.  Subjective Assessment - 10/24/18 1020    Subjective  After last session, pt felt good until she laid down to sleep and she could not find a comfortable position. The L hip pain bothered her for 1 week until she saw Dr. Sharlet Salina who gave her prednisone for 6 days.  The pain decreased by 50% and is not as sharp and as often. Currently, the pain travels down above the knee lateral thigh.         [facility] PT Assessment - 10/24/18 1025      Palpation   Spinal mobility  increased tightness SA, lower trap/ paraspinal      SI assessment   R iliac crest higher in standing/ sitting, L convex lumbar curve     Palpation comment  no delayed downward rotation of scapula B       Ambulation/Gait   Gait Comments  shoe lift in R shoe levelled pelvis,                     [facility] Adult PT Treatment/Exercise - 10/24/18 1104      Therapeutic Activites    Other Therapeutic Activities  towel under L flank, guided slight trunk rotation to offload on L GH to decrease hip pain       Exercises   Exercises  --   see pt instructions to lengthen lumbar, shoe lift in R shoe     Modalities   Modalities  Moist Heat      Moist Heat Therapy   Number Minutes Moist Heat  5 Minutes    Moist Heat Location  --   thoracic     Manual Therapy   Manual therapy comments  STM/MWM at  SA, lower trap/ paraspinal                    PT Long Term Goals - 09/26/18 0951      PT LONG TERM GOAL #1   Title  Balance out bladder irritant  to water by decreasing 50% of bladder irritants  ( half odf decaf tea, juice) , increasing water from 40 oz to 64 oz.  Time  4    Period  Weeks    Status  Achieved      PT LONG TERM GOAL #2   Title  Pt will demo decreased R posterior mm tightness, increased hip ext on R from 4-/5 to 5/5, no pelvic obliquities in order to eliminate bowels and minimzie risk for worsening of prolapse.  ( 7/23: R SIJ tightness remains but hip ext 5/5 stronger, and no pelvic obliquity present )    Time  6    Period  Weeks    Status  Partially Met      PT LONG TERM GOAL #3   Title  Pt will demo proper pelvic floor coordination and contraction to minimize straining with bowel movement and improve LBP     Time  8    Period  Weeks    Status  Achieved      PT LONG TERM GOAL #4   Title  Pt will decrease her ODI score from 36 % to <18 % in order to minimize LBP to vaccuum  ( 7:23: 34%)    Time  10    Period  Weeks    Status  On-going      PT LONG TERM GOAL #5   Title  Pt will decrease her PFIQ7 score from 62 % to <30 % in order to improve pelvic function  and decrease urge ( 7/23:41%)    Time  10    Period  Weeks    Status  Partially Met      Additional Long Term Goals   Additional Long Term Goals  Yes      PT LONG  TERM GOAL #6   Title  Pt will demo improved body mechanics with vaccumming, proper technique with deep core strengthening to perform ADLs    Time  2    Period  Weeks    Status  Achieved      PT LONG TERM GOAL #7   Title  Pt will report Type 4 stool type without straining across 75% of her bowel movements and more regular bowel movements every other or daily in order to minimize straining. worsening of prolapse     Time  10    Period  Weeks    Status  Achieved      PT LONG TERM GOAL #8   Title  Pt will report improved pelvic floor contraction/ coordinaiton in order to gain no difficulty with initiating urination and less feeling of prolapse     Time  6    Period  Weeks    Status  Achieved      PT LONG TERM GOAL  #9   TITLE  Pt will demo no cues required with stand<> floor technique using downward facing dog ( 4 point contact/ mini squat ) to mop floor, retrieve objects from floor minimize pelvic obliquity/ slumped posture/ shoulder IR and decrease straining pelvic floor .    Time  10    Period  Weeks    Status  New    Target Date  12/05/18            Plan - 10/24/18 1115    Clinical Impression Statement  Pt's scoliosis is getting addressed with manual Tx which will continue to improve hip pain. Combined with her pain Tx with Dr. Sharlet Salina, anticipate pt will make positive improvements. Shoe lift in R shoe provided levelling of iliac crest. Manual Tx decreased R mm tightness to lengthen convex curve.  Pt showed good carry over from last session with less narrow BOS in gait. Provided biopsychosocial approaches to minimize bearing down of pelvic floor. Addressed modifications with towel to promote better comfort with sleeping on side. Plan to address pelvic floor at upcoming sessions.  Pt continues to benefit from skilled PT.    Personal Factors and Comorbidities  Age    Stability/Clinical Decision Making  Stable/Uncomplicated    Rehab Potential  Good    PT Frequency  1x / week    PT  Duration  Other (comment)   10   PT Treatment/Interventions  Neuromuscular re-education;Therapeutic activities;Therapeutic exercise;Patient/family education;Manual techniques;Moist Heat;Scar mobilization;Passive range of motion;Gait training    Consulted and Agree with Plan of Care  Patient       Patient will benefit from skilled therapeutic intervention in order to improve the following deficits and impairments:  Decreased safety awareness, Decreased activity tolerance, Decreased strength, Decreased range of motion, Improper body mechanics, Postural dysfunction, Hypomobility, Increased muscle spasms, Decreased endurance  Visit Diagnosis: Chronic low back pain with left-sided sciatica, unspecified back pain laterality  Other muscle spasm  Other lack of coordination  Other abnormalities of gait and mobility     Problem List Patient Active Problem List   Diagnosis Date Noted  . Allergic reaction 06/09/2017  . DIVERTICULITIS, COLON 02/05/2009  . IBS 02/05/2009    Jerl Mina ,PT, DPT, E-RYT  10/24/2018, 4:39 PM  [place] MAIN [facility] SERVICES [address] [place], [place], [address] Phone: [phone]   Fax:  [phone]  Name: Dayana Dalporto MRN: [id number] Date of Birth: [birth year]

## 2018-10-24 NOTE — Patient Instructions (Addendum)
    Lengthen Back rib by R shoulder    Pull R  arm overhead over mattress, grab the edge of mattress,pull it upward, drawing elbow away from ears  Breathing  5 reps    ___  Open Book ( R arm dragging over L only  15 reps  ___  Preform Deep core level 2 in seated position if you can not find comfort on your back  _____  Sleeping on L side:  Place towel uder flank, turn trunk slightly onto pillows to offlaod the   ______ Wear shoe Lift in R shoe

## 2018-10-30 ENCOUNTER — Ambulatory Visit: Payer: Medicare Other | Admitting: Physical Therapy

## 2018-10-30 ENCOUNTER — Other Ambulatory Visit: Payer: Self-pay

## 2018-10-30 DIAGNOSIS — M62838 Other muscle spasm: Secondary | ICD-10-CM

## 2018-10-30 DIAGNOSIS — R2689 Other abnormalities of gait and mobility: Secondary | ICD-10-CM

## 2018-10-30 DIAGNOSIS — M5442 Lumbago with sciatica, left side: Secondary | ICD-10-CM

## 2018-10-30 DIAGNOSIS — R278 Other lack of coordination: Secondary | ICD-10-CM

## 2018-10-30 DIAGNOSIS — G8929 Other chronic pain: Secondary | ICD-10-CM

## 2018-10-30 NOTE — Therapy (Signed)
Kristi Maldonado [facility] SERVICES [address] [place], [place], [address] Phone: [phone]   Fax:  [phone]  Physical Therapy Treatment  Patient Details  Name: Kristi Maldonado MRN: [id number] Date of Birth: 05-13-1940 Referring Provider (PT): Leafy Ro    Encounter Date: 10/30/2018  PT End of Session - 10/30/18 2358    Visit Number  14    Date for PT Re-Evaluation  12/05/18    Authorization Type  recert [employer] note to submit at 19th visit    PT Start Time  1004    PT Stop Time  1105    PT Time Calculation (min)  61 min    Activity Tolerance  Patient tolerated treatment well;No increased pain    Behavior During Therapy  WFL for tasks assessed/performed       Past Medical History:  Diagnosis Date  . Difficult airway for intubation    Patient told by anesthesiology that she should tell future providers to always use a pediatric ET tube due to narrow airway and prior surgical issues including scarring  . Hypertension   . Thyroid disease     Past Surgical History:  Procedure Laterality Date  . SALIVARY GLAND SURGERY    . THYROID SURGERY      There were no vitals filed for this visit.  Subjective Assessment - 10/30/18 1007    Subjective  Pt reported feeling fine until the next day.  Pt was able to sleep on L side for most of last week after last session. Pt is not sleeping on her belly as much and she woke up this morning on her back.  Pt is scheduled for a nerve block injection next week.         [facility] PT Assessment - 10/30/18 1012      Observation/Other Assessments   Observations  shoulders more levelled      Strength   Overall Strength Comments  hip ext 4+/5 B but with pain       Palpation   Palpation comment  R rhomboid major/ minor,  T3, T10. Tenderness at bicep femoris L    tightness at L fibular joint, peroneal brevis/longus      Bed Mobility   Bed Mobility  --   pain with getting into prone position, supine                    [facility] Adult PT Treatment/Exercise - 10/30/18 1012      Ambulation/Gait   Gait Comments  no shoe lift in today but gait revealed signficantly less pelvic shift and spinal deviations.       Exercises   Exercises  --   angle wings, seated scapular depression/retraction     Moist Heat Therapy   Number Minutes Moist Heat  5 Minutes    Moist Heat Location  --   thoracic/ neck      Manual Therapy   Manual therapy comments  STM/MWM  R rhomboid major/ minor, Grade II- III PA mob  T T3, T10, jostling  ,  MWM with low cobra to facilitate thoracic extension                 PT Long Term Goals - 09/26/18 0951      PT LONG TERM GOAL #1   Title  Balance out bladder irritant  to water by decreasing 50% of bladder irritants  ( half odf decaf tea, juice) , increasing water from 40 oz to 64  oz.      Time  4    Period  Weeks    Status  Achieved      PT LONG TERM GOAL #2   Title  Pt will demo decreased R posterior mm tightness, increased hip ext on R from 4-/5 to 5/5, no pelvic obliquities in order to eliminate bowels and minimzie risk for worsening of prolapse.  ( 7/23: R SIJ tightness remains but hip ext 5/5 stronger, and no pelvic obliquity present )    Time  6    Period  Weeks    Status  Partially Met      PT LONG TERM GOAL #3   Title  Pt will demo proper pelvic floor coordination and contraction to minimize straining with bowel movement and improve LBP     Time  8    Period  Weeks    Status  Achieved      PT LONG TERM GOAL #4   Title  Pt will decrease her ODI score from 36 % to <18 % in order to minimize LBP to vaccuum  ( 7:23: 34%)    Time  10    Period  Weeks    Status  On-going      PT LONG TERM GOAL #5   Title  Pt will decrease her PFIQ7 score from 62 % to <30 % in order to improve pelvic function  and decrease urge ( 7/23:41%)    Time  10    Period  Weeks    Status  Partially Met      Additional Long Term Goals   Additional Long Term  Goals  Yes      PT LONG TERM GOAL #6   Title  Pt will demo improved body mechanics with vaccumming, proper technique with deep core strengthening to perform ADLs    Time  2    Period  Weeks    Status  Achieved      PT LONG TERM GOAL #7   Title  Pt will report Type 4 stool type without straining across 75% of her bowel movements and more regular bowel movements every other or daily in order to minimize straining. worsening of prolapse     Time  10    Period  Weeks    Status  Achieved      PT LONG TERM GOAL #8   Title  Pt will report improved pelvic floor contraction/ coordinaiton in order to gain no difficulty with initiating urination and less feeling of prolapse     Time  6    Period  Weeks    Status  Achieved      PT LONG TERM GOAL  #9   TITLE  Pt will demo no cues required with stand<> floor technique using downward facing dog ( 4 point contact/ mini squat ) to mop floor, retrieve objects from floor minimize pelvic obliquity/ slumped posture/ shoulder IR and decrease straining pelvic floor .    Time  10    Period  Weeks    Status  New    Target Date  12/05/18            Plan - 10/30/18 2352    Clinical Impression Statement Pt is making good progress as pt reported she was able to sleep on her sides significantly less pain over the past week.  Pt demo'd improved gait today after shoe lift was provided last week. Pt also showed good carry over with less  pelvic sway and more levelled shoulders. Applied manual Tx to minimize R thoracic mm, L leg tightness and spinal hypomobility which improved post Tx. Continue with more manual Tx to address scoliosis which will further help decrease pt's pain.  Pt will continue to benefit from skilled PT.    Personal Factors and Comorbidities  Age    Stability/Clinical Decision Making  Stable/Uncomplicated    Rehab Potential  Good    PT Frequency  1x / week    PT Duration  Other (comment)   10   PT Treatment/Interventions  Neuromuscular  re-education;Therapeutic activities;Therapeutic exercise;Patient/family education;Manual techniques;Moist Heat;Scar mobilization;Passive range of motion;Gait training    Consulted and Agree with Plan of Care  Patient       Patient will benefit from skilled therapeutic intervention in order to improve the following deficits and impairments:  Decreased safety awareness, Decreased activity tolerance, Decreased strength, Decreased range of motion, Improper body mechanics, Postural dysfunction, Hypomobility, Increased muscle spasms, Decreased endurance  Visit Diagnosis: Chronic low back pain with left-sided sciatica, unspecified back pain laterality  Other muscle spasm  Other lack of coordination  Other abnormalities of gait and mobility     Problem List Patient Active Problem List   Diagnosis Date Noted  . Allergic reaction 06/09/2017  . DIVERTICULITIS, COLON 02/05/2009  . IBS 02/05/2009    Jerl Mina ,PT, DPT, E-RYT  10/30/2018, 11:59 PM  [place] Maldon[place] [facility] SERVICES [address] [place], [place], [address] Phone: [phone]   Fax:  [phone]  Name: Kristi Maldonado MRN: [id number] Date of Birth: 1941-03-03

## 2018-10-30 NOTE — Patient Instructions (Signed)
Angel wings  Seated scapular depression / retraction/ cervical retraction

## 2018-11-06 ENCOUNTER — Other Ambulatory Visit: Payer: Self-pay

## 2018-11-06 ENCOUNTER — Ambulatory Visit: Payer: Medicare Other | Attending: Obstetrics and Gynecology | Admitting: Physical Therapy

## 2018-11-06 DIAGNOSIS — G8929 Other chronic pain: Secondary | ICD-10-CM | POA: Diagnosis present

## 2018-11-06 DIAGNOSIS — R2689 Other abnormalities of gait and mobility: Secondary | ICD-10-CM | POA: Diagnosis present

## 2018-11-06 DIAGNOSIS — R278 Other lack of coordination: Secondary | ICD-10-CM | POA: Insufficient documentation

## 2018-11-06 DIAGNOSIS — M62838 Other muscle spasm: Secondary | ICD-10-CM | POA: Insufficient documentation

## 2018-11-06 DIAGNOSIS — M5442 Lumbago with sciatica, left side: Secondary | ICD-10-CM | POA: Insufficient documentation

## 2018-11-06 NOTE — Patient Instructions (Addendum)
Scoliosis stretches and strengthening:  R midback curve: _________  -wall lean  -sideyling on L, hand hooked on mattress, elbow wing back       L lumbar curves: _______________  -L knee to chest ( opp knee bent)  -  Side of hip stretch:  Reclined twist for hips and side of the hips/ legs  Lay on your back, knees bend Scoot hips to the L , leave shoulders in place Drop knees to the R side resting onto pillows to keep leg at the same width of hips     Keep shoe lift in L shoe

## 2018-11-06 NOTE — Therapy (Signed)
[place] MAIN [facility] SERVICES [address] [place], [place], [address] Phone: [phone]   Fax:  [phone]  Physical Therapy Treatment  Patient Details  Name: Kristi Maldonado MRN: [id number] Date of Birth: 1940-11-20 Referring Provider (PT): Leafy Ro    Encounter Date: 11/06/2018  PT End of Session - 11/06/18 1105    Visit Number  14    Date for PT Re-Evaluation  12/05/18    Authorization Type  recert [employer] note to submit at 19th visit    PT Start Time  1003    PT Stop Time  1100    PT Time Calculation (min)  57 min    Activity Tolerance  Patient tolerated treatment well;No increased pain    Behavior During Therapy  WFL for tasks assessed/performed       Past Medical History:  Diagnosis Date  . Difficult airway for intubation    Patient told by anesthesiology that she should tell future providers to always use a pediatric ET tube due to narrow airway and prior surgical issues including scarring  . Hypertension   . Thyroid disease     Past Surgical History:  Procedure Laterality Date  . SALIVARY GLAND SURGERY    . THYROID SURGERY      There were no vitals filed for this visit.  Subjective Assessment - 11/06/18 0909    Subjective  Pt reported feeling good after last session which lasted until the next day.  The pain has improved from not being constant  and pt has been able to sleep 6-7 hours  through the night across the past 5 days!   It occurs now depending on how she walks and standing. Last night, pt was not able to sleep well to due to pain. The pain now is less in the L hip and more in the low back.         [facility] PT Assessment - 11/06/18 0915      Strength   Overall Strength Comments  hip ext B 4+/5 no pain, hip abd R 4-/5, L 4+/5        Palpation   Palpation comment  decreased thoracic mm tightness,  increased tightness at [employer] med L, increased L paraspinal , hypomobile L SIJ limited hip abd/ER/ ext  (  decreased post Tx)                    [facility] Adult PT Treatment/Exercise - 11/06/18 8299      Therapeutic Activites    Other Therapeutic Activities  explained with diagram about her scoliosis, etiology of pain with deviations in spine and leg length difference, explained which HEP is for which part of the curve, observed gait with and without shoe lift      Neuro Re-ed    Neuro Re-ed Details   cued for proper alignment for new L lumbar curve scoliosis HEP       Moist Heat Therapy   Number Minutes Moist Heat  5 Minutes    Moist Heat Location  Hip      Manual Therapy   Manual therapy comments  Grade III mob PA L SIJ, superior glide sacrum, STM/ MWm IT band, glut med                   PT Long Term Goals - 09/26/18 0951      PT LONG TERM GOAL #1   Title  Balance out bladder irritant  to water  by decreasing 50% of bladder irritants  ( half odf decaf tea, juice) , increasing water from 40 oz to 64 oz.      Time  4    Period  Weeks    Status  Achieved      PT LONG TERM GOAL #2   Title  Pt will demo decreased R posterior mm tightness, increased hip ext on R from 4-/5 to 5/5, no pelvic obliquities in order to eliminate bowels and minimzie risk for worsening of prolapse.  ( 7/23: R SIJ tightness remains but hip ext 5/5 stronger, and no pelvic obliquity present )    Time  6    Period  Weeks    Status  Partially Met      PT LONG TERM GOAL #3   Title  Pt will demo proper pelvic floor coordination and contraction to minimize straining with bowel movement and improve LBP     Time  8    Period  Weeks    Status  Achieved      PT LONG TERM GOAL #4   Title  Pt will decrease her ODI score from 36 % to <18 % in order to minimize LBP to vaccuum  ( 7:23: 34%)    Time  10    Period  Weeks    Status  On-going      PT LONG TERM GOAL #5   Title  Pt will decrease her PFIQ7 score from 62 % to <30 % in order to improve pelvic function  and decrease urge ( 7/23:41%)    Time  10     Period  Weeks    Status  Partially Met      Additional Long Term Goals   Additional Long Term Goals  Yes      PT LONG TERM GOAL #6   Title  Pt will demo improved body mechanics with vaccumming, proper technique with deep core strengthening to perform ADLs    Time  2    Period  Weeks    Status  Achieved      PT LONG TERM GOAL #7   Title  Pt will report Type 4 stool type without straining across 75% of her bowel movements and more regular bowel movements every other or daily in order to minimize straining. worsening of prolapse     Time  10    Period  Weeks    Status  Achieved      PT LONG TERM GOAL #8   Title  Pt will report improved pelvic floor contraction/ coordinaiton in order to gain no difficulty with initiating urination and less feeling of prolapse     Time  6    Period  Weeks    Status  Achieved      PT LONG TERM GOAL  #9   TITLE  Pt will demo no cues required with stand<> floor technique using downward facing dog ( 4 point contact/ mini squat ) to mop floor, retrieve objects from floor minimize pelvic obliquity/ slumped posture/ shoulder IR and decrease straining pelvic floor .    Time  10    Period  Weeks    Status  New    Target Date  12/05/18            Plan - 11/06/18 1105    Clinical Impression Statement  Pt responded well to last session as she was able to sleep for 6-7 hours without interruption of pain for 5 days  in a row after last session. Pain also is less at the L hip and more in low back which indicates the L shoe lift is helpful in creating levelled pelvic alignment. Last sesison, manual Tx focused lengthening R thoracic concave area of curve, and today focused on lengthening L lumbar concave area of curve. Manual Tx facilitated decreased glut med / IT band , L paraspinal tightness and lowered pt's report of pain from 8-9/10 to 1-2/10.  Pt was able to bridge up in bed mobility without pain post Tx. Plan to progress to more strengthening around  scoliosis deficits at upcoming session. Pt continues to benefit from skilled PT.   Personal Factors and Comorbidities  Age    Stability/Clinical Decision Making  Stable/Uncomplicated    Rehab Potential  Good    PT Frequency  1x / week    PT Duration  Other (comment)   10   PT Treatment/Interventions  Neuromuscular re-education;Therapeutic activities;Therapeutic exercise;Patient/family education;Manual techniques;Moist Heat;Scar mobilization;Passive range of motion;Gait training    Consulted and Agree with Plan of Care  Patient       Patient will benefit from skilled therapeutic intervention in order to improve the following deficits and impairments:  Decreased safety awareness, Decreased activity tolerance, Decreased strength, Decreased range of motion, Improper body mechanics, Postural dysfunction, Hypomobility, Increased muscle spasms, Decreased endurance  Visit Diagnosis: Chronic low back pain with left-sided sciatica, unspecified back pain laterality  Other muscle spasm  Other lack of coordination  Other abnormalities of gait and mobility     Problem List Patient Active Problem List   Diagnosis Date Noted  . Allergic reaction 06/09/2017  . DIVERTICULITIS, COLON 02/05/2009  . IBS 02/05/2009    Jerl Mina ,PT, DPT, E-RYT  11/06/2018, 11:08 AM  [place] MAIN [facility] SERVICES [address] [place], [place], [address] Phone: [phone]   Fax:  [phone]  Name: Onesha Krebbs MRN: [id number] Date of Birth: 01-10-1941

## 2018-11-07 ENCOUNTER — Encounter: Payer: Medicare Other | Admitting: Physical Therapy

## 2018-11-14 ENCOUNTER — Other Ambulatory Visit: Payer: Self-pay

## 2018-11-14 ENCOUNTER — Ambulatory Visit: Payer: Medicare Other | Admitting: Physical Therapy

## 2018-11-14 DIAGNOSIS — M5442 Lumbago with sciatica, left side: Secondary | ICD-10-CM | POA: Diagnosis not present

## 2018-11-14 DIAGNOSIS — G8929 Other chronic pain: Secondary | ICD-10-CM

## 2018-11-14 DIAGNOSIS — M62838 Other muscle spasm: Secondary | ICD-10-CM

## 2018-11-14 DIAGNOSIS — R2689 Other abnormalities of gait and mobility: Secondary | ICD-10-CM

## 2018-11-14 DIAGNOSIS — R278 Other lack of coordination: Secondary | ICD-10-CM

## 2018-11-14 NOTE — Patient Instructions (Signed)
Foot on chair and lean for hp flexor stretch for hip extension range of motion

## 2018-11-14 NOTE — Therapy (Signed)
[place] MAIN [facility] SERVICES [address] [place], [place], [address] Phone: [phone]   Fax:  [phone]  Physical Therapy Treatment  Patient Details  Name: Kristi Maldonado MRN: [id number] Date of Birth: Feb 23, 1941 Referring Provider (PT): Leafy Ro    Encounter Date: 11/14/2018  PT End of Session - 11/14/18 1524    Visit Number  15    Date for PT Re-Evaluation  12/05/18    Authorization Type  recert [employer] note to submit at 19th visit    PT Start Time  1402    PT Stop Time  1500    PT Time Calculation (min)  58 min    Activity Tolerance  Patient tolerated treatment well;No increased pain    Behavior During Therapy  WFL for tasks assessed/performed       Past Medical History:  Diagnosis Date  . Difficult airway for intubation    Patient told by anesthesiology that she should tell future providers to always use a pediatric ET tube due to narrow airway and prior surgical issues including scarring  . Hypertension   . Thyroid disease     Past Surgical History:  Procedure Laterality Date  . SALIVARY GLAND SURGERY    . THYROID SURGERY      There were no vitals filed for this visit.  Subjective Assessment - 11/14/18 1412    Subjective  Pt felt good after last week's session but 1 day later, pt felt really severe pain at the L hip.  The back pain has resolved. Pt did not sleep well the following night.  Pt had her 2 injections in her back on Thursday week and she feels much much better but still feels something there at her L hip         [facility] PT Assessment - 11/14/18 1418      AROM   Overall AROM Comments  standing hip ext: L 15 deg, R 20 deg       Palpation   Palpation comment  no tightness at L paraspinal, IT band.  L SIJ hypermobile in hip ext , nutation                    [facility] Adult PT Treatment/Exercise - 11/14/18 1503      Exercises   Exercises  --   hip ext stretch as HEP      Moist Heat Therapy   Number Minutes Moist Heat  5 Minutes    Moist Heat Location  Hip      Manual Therapy   Manual therapy comments  GrDe II -III mob PA at sacrum in prone and sidelying with MWM/ to promote hip ext                    PT Long Term Goals - 11/14/18 1525      PT LONG TERM GOAL #1   Title  Balance out bladder irritant  to water by decreasing 50% of bladder irritants  ( half odf decaf tea, juice) , increasing water from 40 oz to 64 oz.      Time  4    Period  Weeks    Status  Achieved      PT LONG TERM GOAL #2   Title  Pt will demo decreased R posterior mm tightness, increased hip ext on R from 4-/5 to 5/5, no pelvic obliquities in order to eliminate bowels and minimzie risk for worsening of prolapse.  ( 7/23:  R SIJ tightness remains but hip ext 5/5 stronger, and no pelvic obliquity present )    Time  6    Period  Weeks    Status  Partially Met      PT LONG TERM GOAL #3   Title  Pt will demo proper pelvic floor coordination and contraction to minimize straining with bowel movement and improve LBP     Time  8    Period  Weeks    Status  Achieved      PT LONG TERM GOAL #4   Title  Pt will decrease her ODI score from 36 % to <18 % in order to minimize LBP to vaccuum  ( 7:23: 34%)    Time  10    Period  Weeks    Status  On-going      PT LONG TERM GOAL #5   Title  Pt will decrease her PFIQ7 score from 62 % to <30 % in order to improve pelvic function  and decrease urge ( 7/23:41%)    Time  10    Period  Weeks    Status  Partially Met      Additional Long Term Goals   Additional Long Term Goals  Yes      PT LONG TERM GOAL #6   Title  Pt will demo improved body mechanics with vaccumming, proper technique with deep core strengthening to perform ADLs    Time  2    Period  Weeks    Status  Achieved      PT LONG TERM GOAL #7   Title  Pt will report Type 4 stool type without straining across 75% of her bowel movements and more regular bowel movements every other or daily in  order to minimize straining. worsening of prolapse     Time  10    Period  Weeks    Status  Achieved      PT LONG TERM GOAL #8   Title  Pt will report improved pelvic floor contraction/ coordinaiton in order to gain no difficulty with initiating urination and less feeling of prolapse     Time  6    Period  Weeks    Status  Achieved      PT LONG TERM GOAL  #9   TITLE  Pt will demo no cues required with stand<> floor technique using downward facing dog ( 4 point contact/ mini squat ) to mop floor, retrieve objects from floor minimize pelvic obliquity/ slumped posture/ shoulder IR and decrease straining pelvic floor .    Time  10    Period  Weeks    Status  Achieved      PT LONG TERM GOAL  #10   TITLE  Pt will demo hip ext on L at 20 deg standing and mobility at L SIJ, no tenderness at sacrotuberous ligament across 2 visits in order to minimize lateral hip pain and return shopping    Time  8    Period  Weeks    Status  New    Target Date  01/09/19            Plan - 11/14/18 1525    Clinical Impression Statement Pt demo'd no more tightness at L paraspinal, IT band today signifying good carry over from last session. Pt had 2 injections and reports no more LBP pain and just lateral L hip pain. Pt demo'd increased L hip extension after manual Tx which helped to increase  L SIJ mobility. Pt is progressing well and plan to upgrade hip strengthening in CKC positions at upcoming sessions. Pt continues to be compliant to switch from sleeping on her belly to sleeping on side or back to optimize SIJ and spinal mobility and alignment. Pt demo'd stronger trunk muscles which is helping with scoliosis-related deviations along the spinal. Shoe lift is helping as well to promote gait mechanics.  Pt continues to benefit from skilled PT.   Personal Factors and Comorbidities  Age    Stability/Clinical Decision Making  Stable/Uncomplicated    Rehab Potential  Good    PT Frequency  1x / week    PT  Duration  Other (comment)   10   PT Treatment/Interventions  Neuromuscular re-education;Therapeutic activities;Therapeutic exercise;Patient/family education;Manual techniques;Moist Heat;Scar mobilization;Passive range of motion;Gait training    Consulted and Agree with Plan of Care  Patient       Patient will benefit from skilled therapeutic intervention in order to improve the following deficits and impairments:  Decreased safety awareness, Decreased activity tolerance, Decreased strength, Decreased range of motion, Improper body mechanics, Postural dysfunction, Hypomobility, Increased muscle spasms, Decreased endurance  Visit Diagnosis: Chronic low back pain with left-sided sciatica, unspecified back pain laterality  Other muscle spasm  Other lack of coordination  Other abnormalities of gait and mobility     Problem List Patient Active Problem List   Diagnosis Date Noted  . Allergic reaction 06/09/2017  . DIVERTICULITIS, COLON 02/05/2009  . IBS 02/05/2009    Jerl Mina  ,PT, DPT, E-RYT  11/14/2018, 3:28 PM  [place] MAIN [facility] SERVICES [address] [place], [place], [address] Phone: [phone]   Fax:  [phone]  Name: Naarah Borgerding MRN: [id number] Date of Birth: [birth year]

## 2018-11-15 ENCOUNTER — Encounter: Payer: Medicare Other | Admitting: Physical Therapy

## 2018-11-21 ENCOUNTER — Ambulatory Visit: Payer: Medicare Other | Admitting: Physical Therapy

## 2018-11-28 ENCOUNTER — Other Ambulatory Visit: Payer: Self-pay

## 2018-11-28 ENCOUNTER — Ambulatory Visit: Payer: Medicare Other | Admitting: Physical Therapy

## 2018-11-28 DIAGNOSIS — R2689 Other abnormalities of gait and mobility: Secondary | ICD-10-CM

## 2018-11-28 DIAGNOSIS — M62838 Other muscle spasm: Secondary | ICD-10-CM

## 2018-11-28 DIAGNOSIS — G8929 Other chronic pain: Secondary | ICD-10-CM

## 2018-11-28 DIAGNOSIS — R278 Other lack of coordination: Secondary | ICD-10-CM

## 2018-11-28 DIAGNOSIS — M5442 Lumbago with sciatica, left side: Secondary | ICD-10-CM | POA: Diagnosis not present

## 2018-11-28 NOTE — Patient Instructions (Addendum)
R low back stretches:   Side bend Left    Doorway stretch:  stand parallel to wall,     L knee in the front above the ankle, Step back R leg, R foot back on ski tracks   R hand reaches for the doorway to lengthen the R back   __    Deep core level 1 with squeeze pelvic floor squeeze without using upper ab   Deep core level 2

## 2018-11-28 NOTE — Therapy (Signed)
[place] MAIN [facility] SERVICES [address] [place], [place], [address] Phone: [phone]   Fax:  [phone]  Physical Therapy Treatment  Patient Details  Name: Kristi Maldonado MRN: [id number] Date of Birth: [birth year] Referring Provider (PT): Leafy Ro    Encounter Date: 11/28/2018  PT End of Session - 11/28/18 1115    Visit Number  16    Date for PT Re-Evaluation  12/05/18    Authorization Type  recert [employer] note to submit at 19th visit    PT Start Time  1104    PT Stop Time  1210    PT Time Calculation (min)  66 min    Activity Tolerance  Patient tolerated treatment well;No increased pain    Behavior During Therapy  WFL for tasks assessed/performed       Past Medical History:  Diagnosis Date  . Difficult airway for intubation    Patient told by anesthesiology that she should tell future providers to always use a pediatric ET tube due to narrow airway and prior surgical issues including scarring  . Hypertension   . Thyroid disease     Past Surgical History:  Procedure Laterality Date  . SALIVARY GLAND SURGERY    . THYROID SURGERY      There were no vitals filed for this visit.  Subjective Assessment - 11/28/18 1111    Subjective  Pt has been okay except for the L hip pain. Pain is not like it use. Most days it is a 6-7/10.  It still inhibits her quite a bit . It hurts to stand or walk. Pt no longer have radiating pain down the thigh from the L hip pain. THere is also pain inteh R low back.  Pt got a new pessary last Friday and it worked okay all day Saturday and Sunday and then it has not really worked well since. It is not holding her bladder in place. It has been frustrating.         [facility] PT Assessment - 11/28/18 1117      Strength   Overall Strength Comments  hip abd L 4-/5, R4+/ 5 ,  hip flex 4+/5 knee flex 4+5, knee ext 4+/5       Palpation   Palpation comment  tightness semitendinosus L, thoracic interspinal,  paraspinal mm R                   [facility] Adult PT Treatment/Exercise - 11/28/18 1243      Neuro Re-ed    Neuro Re-ed Details   cued for scoliosis lumbar specific , cued for proper deepc ore with less ab overuse        Manual Therapy   Manual therapy comments  STM/MWM along semitendinosus L, thoracic interspinal, paraspinal mm R                    PT Long Term Goals - 11/14/18 1525      PT LONG TERM GOAL #1   Title  Balance out bladder irritant  to water by decreasing 50% of bladder irritants  ( half odf decaf tea, juice) , increasing water from 40 oz to 64 oz.      Time  4    Period  Weeks    Status  Achieved      PT LONG TERM GOAL #2   Title  Pt will demo decreased R posterior mm tightness, increased hip ext on R from 4-/5 to 5/5, no  pelvic obliquities in order to eliminate bowels and minimzie risk for worsening of prolapse.  ( 7/23: R SIJ tightness remains but hip ext 5/5 stronger, and no pelvic obliquity present )    Time  6    Period  Weeks    Status  Partially Met      PT LONG TERM GOAL #3   Title  Pt will demo proper pelvic floor coordination and contraction to minimize straining with bowel movement and improve LBP     Time  8    Period  Weeks    Status  Achieved      PT LONG TERM GOAL #4   Title  Pt will decrease her ODI score from 36 % to <18 % in order to minimize LBP to vaccuum  ( 7:23: 34%)    Time  10    Period  Weeks    Status  On-going      PT LONG TERM GOAL #5   Title  Pt will decrease her PFIQ7 score from 62 % to <30 % in order to improve pelvic function  and decrease urge ( 7/23:41%)    Time  10    Period  Weeks    Status  Partially Met      Additional Long Term Goals   Additional Long Term Goals  Yes      PT LONG TERM GOAL #6   Title  Pt will demo improved body mechanics with vaccumming, proper technique with deep core strengthening to perform ADLs    Time  2    Period  Weeks    Status  Achieved      PT LONG TERM GOAL #7    Title  Pt will report Type 4 stool type without straining across 75% of her bowel movements and more regular bowel movements every other or daily in order to minimize straining. worsening of prolapse     Time  10    Period  Weeks    Status  Achieved      PT LONG TERM GOAL #8   Title  Pt will report improved pelvic floor contraction/ coordinaiton in order to gain no difficulty with initiating urination and less feeling of prolapse     Time  6    Period  Weeks    Status  Achieved      PT LONG TERM GOAL  #9   TITLE  Pt will demo no cues required with stand<> floor technique using downward facing dog ( 4 point contact/ mini squat ) to mop floor, retrieve objects from floor minimize pelvic obliquity/ slumped posture/ shoulder IR and decrease straining pelvic floor .    Time  10    Period  Weeks    Status  Achieved      PT LONG TERM GOAL  #10   TITLE  Pt will demo hip ext on L at 20 deg standing and mobility at L SIJ, no tenderness at sacrotuberous ligament across 2 visits in order to minimize lateral hip pain and return shopping    Time  8    Period  Weeks    Status  New    Target Date  01/09/19            Plan - 11/28/18 1210    Clinical Impression Statement Pt demo'd good carry over with the past 2 weeks without PT session with more equal alignment of pelvic girdle, no pelvic floor tightness, signficantly decreased spinal mm imbalance 2/2  scoliosis, and increased hip strength. Pt reports remaining areas of pain are located at R lumbar and L lateral hip. Focused on manual releases in these areas. Suspect these areas are related to her scoliosis. Post Tx, pt reported her L hip pain decreased from 6-7/10 to 2-3/10 and no R low back pain.  Required excessive cues for minimize overuse of ab in pelvic floor contractions to address her prolapse issues. Plan to progress to more strengthening.  Pt continues to benefit from skilled PT.     Personal Factors and Comorbidities  Age     Stability/Clinical Decision Making  Stable/Uncomplicated    Rehab Potential  Good    PT Frequency  1x / week    PT Duration  Other (comment)   10   PT Treatment/Interventions  Neuromuscular re-education;Therapeutic activities;Therapeutic exercise;Patient/family education;Manual techniques;Moist Heat;Scar mobilization;Passive range of motion;Gait training    Consulted and Agree with Plan of Care  Patient       Patient will benefit from skilled therapeutic intervention in order to improve the following deficits and impairments:  Decreased safety awareness, Decreased activity tolerance, Decreased strength, Decreased range of motion, Improper body mechanics, Postural dysfunction, Hypomobility, Increased muscle spasms, Decreased endurance  Visit Diagnosis: Chronic low back pain with left-sided sciatica, unspecified back pain laterality  Other muscle spasm  Other lack of coordination  Other abnormalities of gait and mobility     Problem List Patient Active Problem List   Diagnosis Date Noted  . Allergic reaction 06/09/2017  . DIVERTICULITIS, COLON 02/05/2009  . IBS 02/05/2009    Jerl Mina ,PT, DPT, E-RYT  11/28/2018, 3:35 PM  [place] MAIN [facility] SERVICES [address] [place], [place], [address] Phone: [phone]   Fax:  [phone]  Name: Kristi Maldonado MRN: [id number] Date of Birth: [birth year]

## 2018-12-05 ENCOUNTER — Other Ambulatory Visit: Payer: Self-pay

## 2018-12-05 ENCOUNTER — Ambulatory Visit: Payer: Medicare Other | Attending: Obstetrics and Gynecology | Admitting: Physical Therapy

## 2018-12-05 DIAGNOSIS — R278 Other lack of coordination: Secondary | ICD-10-CM | POA: Insufficient documentation

## 2018-12-05 DIAGNOSIS — M4125 Other idiopathic scoliosis, thoracolumbar region: Secondary | ICD-10-CM | POA: Diagnosis not present

## 2018-12-05 DIAGNOSIS — M5442 Lumbago with sciatica, left side: Secondary | ICD-10-CM | POA: Insufficient documentation

## 2018-12-05 DIAGNOSIS — G8929 Other chronic pain: Secondary | ICD-10-CM | POA: Diagnosis present

## 2018-12-05 DIAGNOSIS — M62838 Other muscle spasm: Secondary | ICD-10-CM | POA: Diagnosis present

## 2018-12-05 DIAGNOSIS — R2689 Other abnormalities of gait and mobility: Secondary | ICD-10-CM | POA: Diagnosis present

## 2018-12-05 NOTE — Therapy (Signed)
[place] MAIN [facility] SERVICES [address] [place], [place], [address] Phone: [phone]   Fax:  [phone]  Physical Therapy Treatment / Progress Note  07/24/18 to 12/05/18     17th visit   Patient Details  Name: Kristi Maldonado MRN: [id number] Date of Birth: [birth year] Referring Provider (PT): Leafy Ro    Encounter Date: 12/05/2018  PT End of Session - 12/05/18 1110    Visit Number  17    Number of Visits  27    Date for PT Re-Evaluation  02/13/19    Authorization Type  recert submitted at 53GD visit    PT Start Time  1100    PT Stop Time  1203    PT Time Calculation (min)  63 min    Activity Tolerance  Patient tolerated treatment well;No increased pain    Behavior During Therapy  WFL for tasks assessed/performed       Past Medical History:  Diagnosis Date  . Difficult airway for intubation    Patient told by anesthesiology that she should tell future providers to always use a pediatric ET tube due to narrow airway and prior surgical issues including scarring  . Hypertension   . Thyroid disease     Past Surgical History:  Procedure Laterality Date  . SALIVARY GLAND SURGERY    . THYROID SURGERY      There were no vitals filed for this visit.  Subjective Assessment - 12/05/18 1104    Subjective  Pt reported she received her shot in her L hip and was able to walk for 12 min 5 days after the shot. Pt experienced no pain during the walk. Pessary problem: bladder still slips past the pessary. Pt is no longer constipated. Eating bran and probiotics along with the exercises have helped her with her constipation.         [facility] PT Assessment - 12/05/18 1202      Coordination   Gross Motor Movements are Fluid and Coordinated  --   oblique overuse w./ pelvic floor      AROM   Overall AROM Comments  hip ext 20 deg L restored       Strength   Overall Strength Comments  hip strength abd/ ext 5/5        Ambulation/Gait   Gait  Comments  no deviations                 Pelvic Floor Special Questions - 12/05/18 1201    External Perineal Exam  without undergarments     External Palpation  tightness at B anterior triangle of mm, L obt int         [facility] Adult PT Treatment/Exercise - 12/05/18 1202      Neuro Re-ed    Neuro Re-ed Details   excuessive cue for decreasing oblique overuse to minimize straining pelvic floor , minimize worsening of prolapse of bladder                   PT Long Term Goals - 12/05/18 1110      PT LONG TERM GOAL #1   Title  Balance out bladder irritant  to water by decreasing 50% of bladder irritants  ( half odf decaf tea, juice) , increasing water from 40 oz to 64 oz.      Time  4    Period  Weeks    Status  Achieved      PT LONG TERM GOAL #2  Title  Pt will demo decreased R posterior mm tightness, increased hip ext on R from 4-/5 to 5/5, no pelvic obliquities in order to eliminate bowels and minimzie risk for worsening of prolapse.  ( 7/23: R SIJ tightness remains but hip ext 5/5 stronger, and no pelvic obliquity present )    Time  6    Period  Weeks    Status  Achieved      PT LONG TERM GOAL #3   Title  Pt will demo proper pelvic floor coordination and contraction to minimize straining with bowel movement and improve LBP     Time  8    Period  Weeks    Status  Achieved      PT LONG TERM GOAL #4   Title  Pt will decrease her ODI score from 36 % to <18 % in order to minimize LBP to vaccuum  ( 09/26/18: 34%, 12/05/18:  3%)    Time  10    Period  Weeks    Status  On-going      PT LONG TERM GOAL #5   Title  Pt will decrease her PFIQ7 score from 62 % to <30 % in order to improve pelvic function  and decrease urge ( 7/23:41%)    Time  10    Period  Weeks    Status  Partially Met      PT LONG TERM GOAL #6   Title  Pt will demo improved body mechanics with vaccumming, proper technique with deep core strengthening to perform ADLs    Time  2    Period  Weeks     Status  Achieved      PT LONG TERM GOAL #7   Title  Pt will report Type 4 stool type without straining across 75% of her bowel movements and more regular bowel movements every other or daily in order to minimize straining. worsening of prolapse     Time  10    Period  Weeks    Status  Achieved      PT LONG TERM GOAL #8   Title  Pt will report improved pelvic floor contraction/ coordinaiton in order to gain no difficulty with initiating urination and less feeling of prolapse     Time  6    Period  Weeks    Status  Achieved      PT LONG TERM GOAL  #9   TITLE  Pt will demo no cues required with stand<> floor technique using downward facing dog ( 4 point contact/ mini squat ) to mop floor, retrieve objects from floor minimize pelvic obliquity/ slumped posture/ shoulder IR and decrease straining pelvic floor .    Time  10    Period  Weeks    Status  Achieved      PT LONG TERM GOAL  #10   TITLE  Pt will demo hip ext on L at 20 deg standing and mobility at L SIJ, no tenderness at sacrotuberous ligament across 2 visits in order to minimize lateral hip pain and return shopping    Time  8    Period  Weeks    Status  New            Plan - 12/05/18 1110    Clinical Impression Statement Across the past 17 sessions, pt has achieved 9/11 goals. Pt's orthopedic issues have been addressed and with upcoming visits, focus will return to prolapse Sx.    Areas of improvement  include: LBP Sx: _scoliosis, pelvic obliquities, leg length have been addressed which has helped with improving her LBP _ ODI score from 38% to 3% which indicates signficantly improved LBP and function _ no LBP with making her bed _no LBP with walking  L hip Sx: _L hip pain is improving as well with combination of injections with Dr. Sharlet Salina and strengthening exercises _hip strength has increased   _sleeping through the night without waking up due to pain at the L hip. Pt no longer sleeps on her belly as recommended.     Pelvic Sx: _no more constipation and no longer strains which will help minimize worsening of prolapse Remaining issues that require pt to continue with skilled Pelvic PT: bladder still slips past the pessary which may be caused by faulty pattern with pelvic floor contractions as pt demo'd with overuse of abdominal muscles which causes a downward force onto her pelvic floor. Pt also showed increased tightness at anterior mm and L posteriro mm of pelvic floor which decreased post Tx. Withheld contractions until all pelvic floor tightness are absent and her coordination is correct without overuse of abdominal mm.   Pt continues to benefit from skilled PT to achieve remaining goals.      Personal Factors and Comorbidities  Age    Stability/Clinical Decision Making  Stable/Uncomplicated    Rehab Potential  Good    PT Frequency  1x / week    PT Duration  Other (comment)   10   PT Treatment/Interventions  Neuromuscular re-education;Therapeutic activities;Therapeutic exercise;Patient/family education;Manual techniques;Moist Heat;Scar mobilization;Passive range of motion;Gait training    Consulted and Agree with Plan of Care  Patient       Patient will benefit from skilled therapeutic intervention in order to improve the following deficits and impairments:  Decreased safety awareness, Decreased activity tolerance, Decreased strength, Decreased range of motion, Improper body mechanics, Postural dysfunction, Hypomobility, Increased muscle spasms, Decreased endurance  Visit Diagnosis: Chronic low back pain with left-sided sciatica, unspecified back pain laterality  Other muscle spasm  Other lack of coordination  Other abnormalities of gait and mobility     Problem List Patient Active Problem List   Diagnosis Date Noted  . Allergic reaction 06/09/2017  . DIVERTICULITIS, COLON 02/05/2009  . IBS 02/05/2009    Jerl Mina ,PT, DPT, E-RYT  12/05/2018, 3:47 PM  [place] MAIN [facility] SERVICES [address] [place], [place], [address] Phone: [phone]   Fax:  [phone]  Name: Kristi Maldonado MRN: [id number] Date of Birth: 08/07/1940

## 2018-12-05 NOTE — Patient Instructions (Signed)
Hold off on squeeze contractions   Pelvic stretches  1) happy baby  2) figure-4  3) V- slides    After walking stretches:   1) hip flexor    2) quad   3) figure -4    4) cross thigh twist

## 2018-12-12 ENCOUNTER — Ambulatory Visit: Payer: Medicare Other | Admitting: Physical Therapy

## 2018-12-18 ENCOUNTER — Ambulatory Visit: Payer: Medicare Other | Admitting: Physical Therapy

## 2018-12-18 ENCOUNTER — Other Ambulatory Visit: Payer: Self-pay

## 2018-12-18 DIAGNOSIS — M62838 Other muscle spasm: Secondary | ICD-10-CM

## 2018-12-18 DIAGNOSIS — G8929 Other chronic pain: Secondary | ICD-10-CM

## 2018-12-18 DIAGNOSIS — M5442 Lumbago with sciatica, left side: Secondary | ICD-10-CM | POA: Diagnosis not present

## 2018-12-18 DIAGNOSIS — R2689 Other abnormalities of gait and mobility: Secondary | ICD-10-CM

## 2018-12-18 DIAGNOSIS — R278 Other lack of coordination: Secondary | ICD-10-CM

## 2018-12-18 NOTE — Patient Instructions (Addendum)
Standing Quad Stretch  - While holding onto a stable surface, lift with your hand on the same side one heel towards your buttock and hold for 5 belly breaths, repeat 2x on each leg.  - watch posture and correct shoulders as needed   Seated hamstring stretch  - seated at the edge of a firm chair, bring one knee bent so the foot is firmly on the ground. Straighten the other leg, so towards are pointed towards your nose.  - Lean forwards from your hips (like tipping the water forward from your hips), till you feel a stretch in the back of your leg. Hold for 5 belly breaths, repeat 2x on each leg. (keep the knee straight)  Seated Adductor Stretch  - seated at the edge of a firm chair, bring one knee bent so the foot is firmly on the ground. Straighten the other leg, so towards are pointed towards your nose.  - Slide the straight leg out away from your body till you feel a stretch in your inner thigh. Hold for 5 belly breaths, repeat 2x on each leg.     Laying down hip flexor stretch  - lay down at the edge of your bed, bend the knee towards the middle of the bed, then lower the farthest leg off the bed. If you don't feel a stretch, bring the bent leg towards your chest. Hold for 5 belly breaths, repeat 2x on each leg.

## 2018-12-18 NOTE — Therapy (Cosign Needed)
[place] MAIN [facility] SERVICES [address] [place], [place], [address] Phone: [phone]   Fax:  [phone]  Physical Therapy Treatment  Patient Details  Name: Ivianna Notch MRN: [id number] Date of Birth: 06-Nov-1940 Referring Provider (PT): Leafy Ro    Encounter Date: 12/18/2018  PT End of Session - 12/18/18 1219    Visit Number  18    Number of Visits  27    Date for PT Re-Evaluation  02/13/19    Authorization Type  recert submitted at 31RX visit    PT Start Time  1100    PT Stop Time  1200    PT Time Calculation (min)  60 min    Activity Tolerance  Patient tolerated treatment well;No increased pain    Behavior During Therapy  WFL for tasks assessed/performed       Past Medical History:  Diagnosis Date  . Difficult airway for intubation    Patient told by anesthesiology that she should tell future providers to always use a pediatric ET tube due to narrow airway and prior surgical issues including scarring  . Hypertension   . Thyroid disease     Past Surgical History:  Procedure Laterality Date  . SALIVARY GLAND SURGERY    . THYROID SURGERY      There were no vitals filed for this visit.  Subjective Assessment - 12/18/18 1112    Subjective  Pt reports she had another pessasry fitting yesterday but her bladder pushed past pessary.  Pt 's L hip pain is much better with the shot.       Pelvic girdle alignment intact  Hip strength - hip ext/ abd  4+/5 B, posterior sling B UE/LE 4+/5 minimal oblique overuse     External PFM- for assessment:   Patient demonstrated improved elasticity of external PFM this session. Patient was cued for quick contraction of PFM at the bottom of the exhalation during belly breathing, patient demonstrated ability to self correct in order to facilitate appropriate muscle contraction/ relaxation. Patient demonstrated PERF level 3.  Patient education on performance of adding contraction to deep core  level 1 of HEP. And continuing performance of deep core level 2 of HEP. Patient demonstrated and verbalized understanding.        Interventions Performed This session:  There Act: Patient performed 10 minutes of recumbent bike, level 3 resistance for functional movement education- therefore patient can complete recumbent bike safely at home. Patient requires tactile support from towel to maintain upright posture. Patient completed hip flexor stretch B x 5 belly breaths at edge of table (R demonstrated greater restrictions than L). Patient performed B standing quad stretch with unilateral UE support, seated hip adductor stretch, and seated hamstring stretch. All stretches performed following aerobic activity in order to facilitate elongation of musculature and reduce muscle spasms contributing to patients symptoms. Patient education on integrating recumbent bike and/or walking into daily routine and to perform stretches following aerobic activity in order to prevent muscle spasms. Patient verbalized understanding. HEP handout given.                         [facility] Adult PT Treatment/Exercise - 12/19/18 1612      Therapeutic Activites    Other Therapeutic Activities  recumbent bike and walking education, gradual return ( 10 min biking, 0.5 miles walking), HEP for stretches       Neuro Re-ed    Neuro Re-ed Details   --  cued for co-activation w/ deep core in recumbent biking                 PT Long Term Goals - 12/18/18 1115      PT LONG TERM GOAL #1   Title  Balance out bladder irritant  to water by decreasing 50% of bladder irritants  ( half odf decaf tea, juice) , increasing water from 40 oz to 64 oz.      Time  4    Period  Weeks    Status  Achieved      PT LONG TERM GOAL #2   Title  Pt will demo decreased R posterior mm tightness, increased hip ext on R from 4-/5 to 5/5, no pelvic obliquities in order to eliminate bowels and minimzie risk for worsening of  prolapse.  ( 7/23: R SIJ tightness remains but hip ext 5/5 stronger, and no pelvic obliquity present )    Time  6    Period  Weeks    Status  Achieved      PT LONG TERM GOAL #3   Title  Pt will demo proper pelvic floor coordination and contraction to minimize straining with bowel movement and improve LBP     Time  8    Period  Weeks    Status  Achieved      PT LONG TERM GOAL #4   Title  Pt will decrease her ODI score from 36 % to <18 % in order to minimize LBP to vaccuum  ( 7:23: 34%,10/1: 3% )    Time  10    Period  Weeks    Status  Achieved      PT LONG TERM GOAL #5   Title  Pt will decrease her PFIQ7 score from 62 % to <30 % in order to improve pelvic function  and decrease urge ( 7/23:41%, 9/30: 43%)    Time  10    Period  Weeks    Status  Partially Met      PT LONG TERM GOAL #6   Title  Pt will demo improved body mechanics with vaccumming, proper technique with deep core strengthening to perform ADLs    Time  2    Period  Weeks    Status  Achieved      PT LONG TERM GOAL #7   Title  Pt will report Type 4 stool type without straining across 75% of her bowel movements and more regular bowel movements every other or daily in order to minimize straining. worsening of prolapse     Time  10    Period  Weeks    Status  Achieved      PT LONG TERM GOAL #8   Title  Pt will report improved pelvic floor contraction/ coordinaiton in order to gain no difficulty with initiating urination and less feeling of prolapse     Time  6    Period  Weeks    Status  Achieved      PT LONG TERM GOAL  #9   TITLE  Pt will demo no cues required with stand<> floor technique using downward facing dog ( 4 point contact/ mini squat ) to mop floor, retrieve objects from floor minimize pelvic obliquity/ slumped posture/ shoulder IR and decrease straining pelvic floor .    Time  10    Period  Weeks    Status  Achieved      PT LONG TERM GOAL  #10  TITLE  Pt will demo hip ext on L at 20 deg standing and  mobility at L SIJ, no tenderness at sacrotuberous ligament across 2 visits in order to minimize lateral hip pain and return shopping    Time  8    Period  Weeks    Status  Achieved      PT LONG TERM GOAL  #11   TITLE  Pt will demo no straining of ab muscles with downward force of pelvic floor without cues when performing deep core exercises and pelvic floor contractions in order to minimize worsening of prolapse    Time  4    Period  Weeks    Status  New            Plan - 12/18/18 1220    Clinical Impression Statement  Pt showed good carry over with pelvic stability and hip/ back/ deep core strength. Pt no longer has L hip pain and thus, progressed pt to recumbent biking and walking incrementally to help pt return to fitness. Pt demo'd IND with flexibility HEP for these forms of exercise. Pt also demo'd improved coordination with pelvic floor contraction without cues andn no longer demo'd abdominal straining which will help with prolapse. Plan to continue with pelvic floor strengthening and  co-activation in exercise.   Pt continues to benefit from skilled PT.    Patient demonstrates improved diaphragmatic breathing with PFM contraction this session in hook-lying, patient demonstrated self correction and awareness of incorrect of incomplete performance of PFM contraction with diaphragmatic breathing. Patient demonstrates quick fatigability of PFM holding for <2 second each contraction, while demonstrating functional PFM contraction.   Patient performed recumbent bike efficiency for 10 minutes, and with minimal cueing was able to perform LE stretches following aerobic activity. Patient demonstrated R restrictions v L LE during hip flexor stretch, and patient demonstrated understanding of regression/progressions of hip flexor stretches. Patient handout given for HEP progressions, and to continue to deep core and scoliotic correction exercises. Patient demonstrated and verbalized understanding.      Patient will continue to benefit from skilled pelvic health PT to address goals yet met, and improve PFM contraction endurance and coordination with functional movements.    Personal Factors and Comorbidities  Age    Stability/Clinical Decision Making  Stable/Uncomplicated    Rehab Potential  Good    PT Frequency  1x / week    PT Duration  Other (comment)   10   PT Treatment/Interventions  Neuromuscular re-education;Therapeutic activities;Therapeutic exercise;Patient/family education;Manual techniques;Moist Heat;Scar mobilization;Passive range of motion;Gait training    Consulted and Agree with Plan of Care  Patient       Patient will benefit from skilled therapeutic intervention in order to improve the following deficits and impairments:  Decreased safety awareness, Decreased activity tolerance, Decreased strength, Decreased range of motion, Improper body mechanics, Postural dysfunction, Hypomobility, Increased muscle spasms, Decreased endurance  Visit Diagnosis: Chronic low back pain with left-sided sciatica, unspecified back pain laterality  Other muscle spasm  Other lack of coordination  Other abnormalities of gait and mobility     Problem List Patient Active Problem List   Diagnosis Date Noted  . Allergic reaction 06/09/2017  . DIVERTICULITIS, COLON 02/05/2009  . IBS 02/05/2009    Rahkeem Senft, SPT    Jerl Mina, PT, DPT, E-RYT   12/19/2018, 12:21 PM  [place] MAIN [facility] SERVICES [address] [place], [place], [address] Phone: [phone]   Fax:  [phone]  Name:  Evelina Lore MRN: [id number] Date of Birth: 1940-07-26

## 2018-12-25 ENCOUNTER — Ambulatory Visit: Payer: Medicare Other | Admitting: Physical Therapy

## 2018-12-25 ENCOUNTER — Other Ambulatory Visit: Payer: Self-pay

## 2018-12-25 DIAGNOSIS — G8929 Other chronic pain: Secondary | ICD-10-CM

## 2018-12-25 DIAGNOSIS — R2689 Other abnormalities of gait and mobility: Secondary | ICD-10-CM

## 2018-12-25 DIAGNOSIS — M5442 Lumbago with sciatica, left side: Secondary | ICD-10-CM | POA: Diagnosis not present

## 2018-12-25 DIAGNOSIS — M62838 Other muscle spasm: Secondary | ICD-10-CM

## 2018-12-25 DIAGNOSIS — R278 Other lack of coordination: Secondary | ICD-10-CM

## 2018-12-25 NOTE — Therapy (Signed)
[place] MAIN [facility] SERVICES [address] [place], [place], [address] Phone: [phone]   Fax:  [phone]  Physical Therapy Treatment  Patient Details  Name: Kristi Maldonado MRN: [id number] Date of Birth: March 21, 1940 Referring Provider (PT): Leafy Ro    Encounter Date: 12/25/2018  PT End of Session - 12/25/18 1614    Visit Number  19    Number of Visits  27    Date for PT Re-Evaluation  02/13/19    Authorization Type  recert submitted at 85ZM visit    PT Start Time  1100    PT Stop Time  1200    PT Time Calculation (min)  60 min    Activity Tolerance  Patient tolerated treatment well;No increased pain    Behavior During Therapy  WFL for tasks assessed/performed       Past Medical History:  Diagnosis Date  . Difficult airway for intubation    Patient told by anesthesiology that she should tell future providers to always use a pediatric ET tube due to narrow airway and prior surgical issues including scarring  . Hypertension   . Thyroid disease     Past Surgical History:  Procedure Laterality Date  . SALIVARY GLAND SURGERY    . THYROID SURGERY      There were no vitals filed for this visit.  Subjective Assessment - 12/25/18 1110    Subjective  Pt reports she biked once and walked .5 mile more this week . Pt has a pessary fitting next week         [facility] PT Assessment - 12/25/18 1620      Coordination   Gross Motor Movements are Fluid and Coordinated  --   overuse of abdominal mm, downward strain of pelvic floor               Pelvic Floor Special Questions - 12/25/18 1619    Pelvic Floor Internal Exam  pt consented verbally without contradications     Exam Type  Vaginal    Palpation  increased tightness/ tenderness B anterior attachment of puborectalis posterior to pubic symphysis, urethra compressae B tightness ( improved post Tx)     Strength  --   pre Tx: 2/5 ( posterior dominant), post Tx: 3/5         [facility] Adult PT Treatment/Exercise - 12/25/18 1624      Therapeutic Activites    Other Therapeutic Activities  biopsychosocial approaches, relaxation . mindfulness techniques for stress management and less reflexiveness of abdominal tensions with stress       Neuro Re-ed    Neuro Re-ed Details   tactile/ verbal visual cues for anterior tilt of pelvis, sequential activation of anterior pelvic floor mm        Moist Heat Therapy   Number Minutes Moist Heat  5 Minutes    Moist Heat Location  --   perineum through sheets/ pillow case     Manual Therapy   Internal Pelvic Floor  STM/MWM at anterior pelvic floor mm.  see assessment for specific muscles                   PT Long Term Goals - 12/18/18 1115      PT LONG TERM GOAL #1   Title  Balance out bladder irritant  to water by decreasing 50% of bladder irritants  ( half odf decaf tea, juice) , increasing water from 40 oz to 64 oz.  Time  4    Period  Weeks    Status  Achieved      PT LONG TERM GOAL #2   Title  Pt will demo decreased R posterior mm tightness, increased hip ext on R from 4-/5 to 5/5, no pelvic obliquities in order to eliminate bowels and minimzie risk for worsening of prolapse.  ( 7/23: R SIJ tightness remains but hip ext 5/5 stronger, and no pelvic obliquity present )    Time  6    Period  Weeks    Status  Achieved      PT LONG TERM GOAL #3   Title  Pt will demo proper pelvic floor coordination and contraction to minimize straining with bowel movement and improve LBP     Time  8    Period  Weeks    Status  Achieved      PT LONG TERM GOAL #4   Title  Pt will decrease her ODI score from 36 % to <18 % in order to minimize LBP to vaccuum  ( 7:23: 34%,10/1: 3% )    Time  10    Period  Weeks    Status  Achieved      PT LONG TERM GOAL #5   Title  Pt will decrease her PFIQ7 score from 62 % to <30 % in order to improve pelvic function  and decrease urge ( 7/23:41%, 9/30: 43%)    Time  10    Period   Weeks    Status  Partially Met      PT LONG TERM GOAL #6   Title  Pt will demo improved body mechanics with vaccumming, proper technique with deep core strengthening to perform ADLs    Time  2    Period  Weeks    Status  Achieved      PT LONG TERM GOAL #7   Title  Pt will report Type 4 stool type without straining across 75% of her bowel movements and more regular bowel movements every other or daily in order to minimize straining. worsening of prolapse     Time  10    Period  Weeks    Status  Achieved      PT LONG TERM GOAL #8   Title  Pt will report improved pelvic floor contraction/ coordinaiton in order to gain no difficulty with initiating urination and less feeling of prolapse     Time  6    Period  Weeks    Status  Achieved      PT LONG TERM GOAL  #9   TITLE  Pt will demo no cues required with stand<> floor technique using downward facing dog ( 4 point contact/ mini squat ) to mop floor, retrieve objects from floor minimize pelvic obliquity/ slumped posture/ shoulder IR and decrease straining pelvic floor .    Time  10    Period  Weeks    Status  Achieved      PT LONG TERM GOAL  #10   TITLE  Pt will demo hip ext on L at 20 deg standing and mobility at L SIJ, no tenderness at sacrotuberous ligament across 2 visits in order to minimize lateral hip pain and return shopping    Time  8    Period  Weeks    Status  Achieved      PT LONG TERM GOAL  #11   TITLE  Pt will demo no straining of ab muscles with downward  force of pelvic floor without cues when performing deep core exercises and pelvic floor contractions in order to minimize worsening of prolapse    Time  4    Period  Weeks    Status  New            Plan - 12/25/18 1614    Clinical Impression Statement  Pt is progressing well with no more gait deviations, shoulder height and pelvic girdle are both more levelled, and deep core strength/ strength is more present. Pt no longer complains of L hip pain.   Focused  on pt's pelvic floor today to minimize worsening of prolapse and facilitate pessary use with more comfort. Internal assessment today showed increased tightness of anterior pelvic floor mm which decreased with neuromuscular reeducation and internal manual Tx. Pt tolerated without complaints. Pt achieved improved relaxation of body tensions, anterior tilt propioception of pelvis, decreased pelvic floor mm tightness, and improved sequential recruitment of B anterior pelvic floor mm without overuse of abdominal mm. Pt expressed feeling stressed by the COVID pandemic and thus, pt was provided biopyschosocial approaches such as mindfulness techniques and relaxation. Pt demo'd increased ability to release mm tensions post Tx and reported feeling better .  Anticipate pt will yield greater outcomes with self-management of stress as pt has a predisposition to tighten mm and overuse abdominal mm for pelvic floor activation which leads to dysynergia and puts her at greater risk for worsening prolapse or the pessary slipping out of place.  Pt continues to benefit from skilled PT.    Personal Factors and Comorbidities  Age    Stability/Clinical Decision Making  Stable/Uncomplicated    Rehab Potential  Good    PT Frequency  1x / week    PT Duration  Other (comment)   10   PT Treatment/Interventions  Neuromuscular re-education;Therapeutic activities;Therapeutic exercise;Patient/family education;Manual techniques;Moist Heat;Scar mobilization;Passive range of motion;Gait training    Consulted and Agree with Plan of Care  Patient       Patient will benefit from skilled therapeutic intervention in order to improve the following deficits and impairments:  Decreased safety awareness, Decreased activity tolerance, Decreased strength, Decreased range of motion, Improper body mechanics, Postural dysfunction, Hypomobility, Increased muscle spasms, Decreased endurance  Visit Diagnosis: Chronic low back pain with left-sided  sciatica, unspecified back pain laterality  Other muscle spasm  Other lack of coordination  Other abnormalities of gait and mobility     Problem List Patient Active Problem List   Diagnosis Date Noted  . Allergic reaction 06/09/2017  . DIVERTICULITIS, COLON 02/05/2009  . IBS 02/05/2009    Jerl Mina ,PT, DPT, E-RYT  12/25/2018, 4:32 PM  [place] MAIN [facility] SERVICES [address] [place], [place], [address] Phone: [phone]   Fax:  [phone]  Name: Marline Morace MRN: [id number] Date of Birth: 1940-03-29

## 2018-12-25 NOTE — Patient Instructions (Signed)
Tie shoes with figure 4 stretch    Body scan To relax daily   Deep core level 1 and 2 and with pelvic floor contractions   Relaxed Feet firm Anterior tilt of pelvis Less effort  Drawing up of pelvic floor then belly sinks not the belly doing the work

## 2019-01-02 ENCOUNTER — Ambulatory Visit: Payer: Medicare Other | Admitting: Physical Therapy

## 2019-01-02 ENCOUNTER — Other Ambulatory Visit: Payer: Self-pay

## 2019-01-02 DIAGNOSIS — R278 Other lack of coordination: Secondary | ICD-10-CM

## 2019-01-02 DIAGNOSIS — G8929 Other chronic pain: Secondary | ICD-10-CM

## 2019-01-02 DIAGNOSIS — M62838 Other muscle spasm: Secondary | ICD-10-CM

## 2019-01-02 DIAGNOSIS — M5442 Lumbago with sciatica, left side: Secondary | ICD-10-CM

## 2019-01-02 DIAGNOSIS — R2689 Other abnormalities of gait and mobility: Secondary | ICD-10-CM

## 2019-01-02 NOTE — Patient Instructions (Addendum)
  Exercise Daily   Mon Tue Wed Raynelle Dick Fri  Sat Sun  Walking  15 min          Stretches           Deep core level 1 + quick contractions  Pillow under hips           Deep core level 2  76min Moving one knee            3 x a day at meal time   Mon Tue Wed Thurs Fri  Sat Sun  Seated  Pelvic floor breathing + quick squeeze 8 reps                Threes a week   Mon Tue Wed Thurs Fri  Sat Sun  Biking 10 min  With stretches            ___

## 2019-01-02 NOTE — Therapy (Signed)
[place] MAIN [facility] SERVICES [address] [place], [place], [address] Phone: [phone]   Fax:  [phone]  Physical Therapy Treatment  Patient Details  Name: Kristi Maldonado MRN: [id number] Date of Birth: Jan 10, 1941 Referring Provider (PT): Leafy Ro    Encounter Date: 01/02/2019  PT End of Session - 01/02/19 1142    Visit Number  20    Number of Visits  27    Date for PT Re-Evaluation  02/13/19    Authorization Type  recert submitted at 75IE visit    PT Start Time  1108    PT Stop Time  1146    PT Time Calculation (min)  38 min    Activity Tolerance  Patient tolerated treatment well;No increased pain    Behavior During Therapy  WFL for tasks assessed/performed       Past Medical History:  Diagnosis Date  . Difficult airway for intubation    Patient told by anesthesiology that she should tell future providers to always use a pediatric ET tube due to narrow airway and prior surgical issues including scarring  . Hypertension   . Thyroid disease     Past Surgical History:  Procedure Laterality Date  . SALIVARY GLAND SURGERY    . THYROID SURGERY      There were no vitals filed for this visit.  Subjective Assessment - 01/02/19 1111    Subjective  Pt got fitted for another pessary this week. Pt would like to not stay too long for today's session because the weather is rough with the rain and winds and she wanted to return home.  "I think I know how to handle stress better".         [facility] PT Assessment - 01/02/19 1144      Observation/Other Assessments   Observations  seated posture no cues for upright,  cued for more anterior tilt  of  pelvis    No genu valgus in sit to stand                Pelvic Floor Special Questions - 01/02/19 1143    External Perineal Exam  seated, 2 trials with ab overuse with pelvic floor contraction but pt able to self correct without cueing .   Cued for more anterior pelvic floor  activation        [place] [facility] PT Treatment/Exercise - 01/02/19 1145      Therapeutic Activites    Therapeutic Activities  --   discussed compliance and created a chart for more compliance     Neuro Re-ed    Neuro Re-ed Details   cued for anterior pelvic floor and more anterior pelvic floor activation                   PT Long Term Goals - 12/18/18 1115      PT LONG TERM GOAL #1   Title  Balance out bladder irritant  to water by decreasing 50% of bladder irritants  ( half odf decaf tea, juice) , increasing water from 40 oz to 64 oz.      Time  4    Period  Weeks    Status  Achieved      PT LONG TERM GOAL #2   Title  Pt will demo decreased R posterior mm tightness, increased hip ext on R from 4-/5 to 5/5, no pelvic obliquities in order to eliminate bowels and minimzie risk for worsening of prolapse.  ( 7/23: R  SIJ tightness remains but hip ext 5/5 stronger, and no pelvic obliquity present )    Time  6    Period  Weeks    Status  Achieved      PT LONG TERM GOAL #3   Title  Pt will demo proper pelvic floor coordination and contraction to minimize straining with bowel movement and improve LBP     Time  8    Period  Weeks    Status  Achieved      PT LONG TERM GOAL #4   Title  Pt will decrease her ODI score from 36 % to <18 % in order to minimize LBP to vaccuum  ( 7:23: 34%,10/1: 3% )    Time  10    Period  Weeks    Status  Achieved      PT LONG TERM GOAL #5   Title  Pt will decrease her PFIQ7 score from 62 % to <30 % in order to improve pelvic function  and decrease urge ( 7/23:41%, 9/30: 43%)    Time  10    Period  Weeks    Status  Partially Met      PT LONG TERM GOAL #6   Title  Pt will demo improved body mechanics with vaccumming, proper technique with deep core strengthening to perform ADLs    Time  2    Period  Weeks    Status  Achieved      PT LONG TERM GOAL #7   Title  Pt will report Type 4 stool type without straining across 75% of her bowel  movements and more regular bowel movements every other or daily in order to minimize straining. worsening of prolapse     Time  10    Period  Weeks    Status  Achieved      PT LONG TERM GOAL #8   Title  Pt will report improved pelvic floor contraction/ coordinaiton in order to gain no difficulty with initiating urination and less feeling of prolapse     Time  6    Period  Weeks    Status  Achieved      PT LONG TERM GOAL  #9   TITLE  Pt will demo no cues required with stand<> floor technique using downward facing dog ( 4 point contact/ mini squat ) to mop floor, retrieve objects from floor minimize pelvic obliquity/ slumped posture/ shoulder IR and decrease straining pelvic floor .    Time  10    Period  Weeks    Status  Achieved      PT LONG TERM GOAL  #10   TITLE  Pt will demo hip ext on L at 20 deg standing and mobility at L SIJ, no tenderness at sacrotuberous ligament across 2 visits in order to minimize lateral hip pain and return shopping    Time  8    Period  Weeks    Status  Achieved      PT LONG TERM GOAL  #11   TITLE  Pt will demo no straining of ab muscles with downward force of pelvic floor without cues when performing deep core exercises and pelvic floor contractions in order to minimize worsening of prolapse    Time  4    Period  Weeks    Status  New            Plan - 01/02/19 1143    Clinical Impression Statement  Pt progressed to  seated quick pelvic floor contractions with significantly less overuse of abdominal use and able to self correct without cues. Pt required minor cues for more anterior pelvic floor. Pt continues to show increased deep core and hip strength with no genu valgus in sit to stand t/f and no more gait deviations. Discussed the importance of proper coordination yielding better support for prolapse and optimizing pessary fitting.  Decreasing pt's sessions to 2x month to provide pt more time with HEP and plan to progress with strengthening at  upcoming sessions to minimize relapse of Sx of hip pain, spinal deviations, and pelvic floor dysfunctions.    Personal Factors and Comorbidities  Age    Stability/Clinical Decision Making  Stable/Uncomplicated    Rehab Potential  Good    PT Frequency  2x / month   PT Duration  Other (comment)   10   PT Treatment/Interventions  Neuromuscular re-education;Therapeutic activities;Therapeutic exercise;Patient/family education;Manual techniques;Moist Heat;Scar mobilization;Passive range of motion;Gait training    Consulted and Agree with Plan of Care  Patient       Patient will benefit from skilled therapeutic intervention in order to improve the following deficits and impairments:  Decreased safety awareness, Decreased activity tolerance, Decreased strength, Decreased range of motion, Improper body mechanics, Postural dysfunction, Hypomobility, Increased muscle spasms, Decreased endurance  Visit Diagnosis: Chronic low back pain with left-sided sciatica, unspecified back pain laterality  Other muscle spasm  Other lack of coordination  Other abnormalities of gait and mobility     Problem List Patient Active Problem List   Diagnosis Date Noted  . Allergic reaction 06/09/2017  . DIVERTICULITIS, COLON 02/05/2009  . IBS 02/05/2009    Jerl Mina ,PT, DPT, E-RYT  01/02/2019, 11:46 AM  [place] MAIN [facility] SERVICES [address] [place], [place], [address] Phone: [phone]   Fax:  [phone]  Name: Kristi Maldonado MRN: [id number] Date of Birth: 06/14/1940

## 2019-01-16 ENCOUNTER — Ambulatory Visit: Payer: Medicare Other | Admitting: Physical Therapy

## 2019-02-06 ENCOUNTER — Encounter: Payer: Medicare Other | Admitting: Physical Therapy

## 2019-02-19 ENCOUNTER — Encounter: Payer: Self-pay | Admitting: Physical Therapy

## 2019-02-19 DIAGNOSIS — G8929 Other chronic pain: Secondary | ICD-10-CM

## 2019-02-19 DIAGNOSIS — M5442 Lumbago with sciatica, left side: Secondary | ICD-10-CM

## 2019-02-19 DIAGNOSIS — M62838 Other muscle spasm: Secondary | ICD-10-CM

## 2019-02-19 DIAGNOSIS — R278 Other lack of coordination: Secondary | ICD-10-CM

## 2019-02-19 DIAGNOSIS — R2689 Other abnormalities of gait and mobility: Secondary | ICD-10-CM

## 2019-02-19 NOTE — Therapy (Signed)
[place] MAIN [facility] SERVICES [address] [place], [place], [address] Phone: [phone]   Fax:  [phone]  Patient Details  Name: Kristi Maldonado MRN: [id number] Date of Birth: Nov 27, 1940 Referring Provider:  Leafy Ro   Encounter Date: 02/19/2019  Discharge Summary reporting 07/24/18 to 01/02/19 ( 20 visits)    Across the past 20 sessions, pt has achieved 9/11 goals and partially met remaining goals.  Areas of improvement include:  LBP Sx: _scoliosis, pelvic obliquities, leg length have been addressed which has helped with improving her LBP _ ODI score from 38% to 3% which indicates signficantly improved LBP and function _ no LBP with making her bed _no LBP with walking  L hip Sx: _L hip pain is improving as well with combination of injections with Dr. Sharlet Salina and strengthening exercises _hip strength has increased   _sleeping through the night without waking up due to pain at the L hip. Pt no longer sleeps on her belly as recommended.    Pelvic Sx: _no more constipation and no longer strains which will help minimize worsening of prolapse _ PFIQ7 score from 62% to 43%, indicating improve pelvic floor function.   _Withheld contractions until all pelvic floor tightness are absent and her coordination is correct without overuse of abdominal mm.  _Pt's 5th pessary fitting on 02/06/19 has been working for her and her bladder has been staying in place which has been an improvement.   DPT spoke with pt on 02/19/19 and pt reported she wants to self-d/c at this time as she is doing better and needs to take care of other doctors' appt and would like to wait for her bill for past PT sessions. Pt understands the process of getting another referral if she needs to return to PT. Reinforced with pt on maintaining HEP and pt voiced understanding.            PT Long Term Goals - 12/18/18 1115      PT LONG TERM GOAL #1   Title  Balance out bladder  irritant  to water by decreasing 50% of bladder irritants  ( half odf decaf tea, juice) , increasing water from 40 oz to 64 oz.      Time  4    Period  Weeks    Status  Achieved      PT LONG TERM GOAL #2   Title  Pt will demo decreased R posterior mm tightness, increased hip ext on R from 4-/5 to 5/5, no pelvic obliquities in order to eliminate bowels and minimzie risk for worsening of prolapse.  ( 7/23: R SIJ tightness remains but hip ext 5/5 stronger, and no pelvic obliquity present )    Time  6    Period  Weeks    Status  Achieved      PT LONG TERM GOAL #3   Title  Pt will demo proper pelvic floor coordination and contraction to minimize straining with bowel movement and improve LBP     Time  8    Period  Weeks    Status  Achieved      PT LONG TERM GOAL #4   Title  Pt will decrease her ODI score from 36 % to <18 % in order to minimize LBP to vaccuum  ( 7:23: 34%,10/1: 3% )    Time  10    Period  Weeks    Status  Achieved      PT LONG TERM GOAL #5  Title  Pt will decrease her PFIQ7 score from 62 % to <30 % in order to improve pelvic function  and decrease urge ( 7/23:41%, 9/30: 43%)    Time  10    Period  Weeks    Status  Partially Met      PT LONG TERM GOAL #6   Title  Pt will demo improved body mechanics with vaccumming, proper technique with deep core strengthening to perform ADLs    Time  2    Period  Weeks    Status  Achieved      PT LONG TERM GOAL #7   Title  Pt will report Type 4 stool type without straining across 75% of her bowel movements and more regular bowel movements every other or daily in order to minimize straining. worsening of prolapse     Time  10    Period  Weeks    Status  Achieved      PT LONG TERM GOAL #8   Title  Pt will report improved pelvic floor contraction/ coordinaiton in order to gain no difficulty with initiating urination and less feeling of prolapse     Time  6    Period  Weeks    Status  Achieved      PT LONG TERM GOAL  #9   TITLE   Pt will demo no cues required with stand<> floor technique using downward facing dog ( 4 point contact/ mini squat ) to mop floor, retrieve objects from floor minimize pelvic obliquity/ slumped posture/ shoulder IR and decrease straining pelvic floor .    Time  10    Period  Weeks    Status  Achieved      PT LONG TERM GOAL  #10   TITLE  Pt will demo hip ext on L at 20 deg standing and mobility at L SIJ, no tenderness at sacrotuberous ligament across 2 visits in order to minimize lateral hip pain and return shopping    Time  8    Period  Weeks    Status  Achieved      PT LONG TERM GOAL  #11   TITLE  Pt will demo no straining of ab muscles with downward force of pelvic floor without cues when performing deep core exercises and pelvic floor contractions in order to minimize worsening of prolapse    Time  4    Period  Weeks    Status  Partially Met       Jerl Mina ,PT, DPT, E-RYT  02/19/2019, 9:01 AM  [place] [address] [place], [place], [address] Phone: [phone]   Fax:  [phone]

## 2019-02-20 ENCOUNTER — Encounter: Payer: Medicare Other | Admitting: Physical Therapy

## 2019-03-10 DIAGNOSIS — T8389XA Other specified complication of genitourinary prosthetic devices, implants and grafts, initial encounter: Secondary | ICD-10-CM | POA: Insufficient documentation

## 2019-03-21 ENCOUNTER — Emergency Department: Payer: Medicare Other

## 2019-03-21 ENCOUNTER — Other Ambulatory Visit: Payer: Self-pay

## 2019-03-21 DIAGNOSIS — E876 Hypokalemia: Secondary | ICD-10-CM | POA: Insufficient documentation

## 2019-03-21 DIAGNOSIS — R42 Dizziness and giddiness: Secondary | ICD-10-CM | POA: Diagnosis not present

## 2019-03-21 DIAGNOSIS — I10 Essential (primary) hypertension: Secondary | ICD-10-CM | POA: Diagnosis present

## 2019-03-21 DIAGNOSIS — Z79899 Other long term (current) drug therapy: Secondary | ICD-10-CM | POA: Insufficient documentation

## 2019-03-21 LAB — BASIC METABOLIC PANEL
Anion gap: 10 (ref 5–15)
BUN: 16 mg/dL (ref 8–23)
CO2: 25 mmol/L (ref 22–32)
Calcium: 9 mg/dL (ref 8.9–10.3)
Chloride: 105 mmol/L (ref 98–111)
Creatinine, Ser: 1.15 mg/dL — ABNORMAL HIGH (ref 0.44–1.00)
GFR calc Af Amer: 52 mL/min — ABNORMAL LOW (ref >60)
GFR calc non Af Amer: 45 mL/min — ABNORMAL LOW (ref >60)
Glucose, Bld: 112 mg/dL — ABNORMAL HIGH (ref 70–99)
Potassium: 3.1 mmol/L — ABNORMAL LOW (ref 3.5–5.1)
Sodium: 140 mmol/L (ref 135–145)

## 2019-03-21 LAB — CBC
HCT: 35.4 % — ABNORMAL LOW (ref 36.0–46.0)
Hemoglobin: 11.9 g/dL — ABNORMAL LOW (ref 12.0–15.0)
MCH: 31.5 pg (ref 26.0–34.0)
MCHC: 33.6 g/dL (ref 30.0–36.0)
MCV: 93.7 fL (ref 80.0–100.0)
Platelets: 240 10*3/uL (ref 150–400)
RBC: 3.78 MIL/uL — ABNORMAL LOW (ref 3.87–5.11)
RDW: 12.9 % (ref 11.5–15.5)
WBC: 8.6 10*3/uL (ref 4.0–10.5)
nRBC: 0 % (ref 0.0–0.2)

## 2019-03-21 NOTE — ED Triage Notes (Signed)
Patient reports became dizzy and lightheaded earlier and check BP it was 190 then in the 200's, states "I got scared so I wanted to get it checked."

## 2019-03-22 ENCOUNTER — Emergency Department
Admission: EM | Admit: 2019-03-22 | Discharge: 2019-03-22 | Disposition: A | Discharge status: Home / Self Care | Payer: Medicare Other | Attending: Emergency Medicine | Admitting: Emergency Medicine

## 2019-03-22 ENCOUNTER — Emergency Department: Payer: Medicare Other

## 2019-03-22 DIAGNOSIS — R42 Dizziness and giddiness: Secondary | ICD-10-CM

## 2019-03-22 DIAGNOSIS — E876 Hypokalemia: Secondary | ICD-10-CM

## 2019-03-22 DIAGNOSIS — I10 Essential (primary) hypertension: Secondary | ICD-10-CM | POA: Diagnosis not present

## 2019-03-22 LAB — URINALYSIS, COMPLETE (UACMP) WITH MICROSCOPIC
Bacteria, UA: NONE SEEN
Bilirubin Urine: NEGATIVE
Glucose, UA: NEGATIVE mg/dL
Ketones, ur: NEGATIVE mg/dL
Leukocytes,Ua: NEGATIVE
Nitrite: NEGATIVE
Protein, ur: NEGATIVE mg/dL
Specific Gravity, Urine: 1.003 — ABNORMAL LOW (ref 1.005–1.030)
pH: 7 (ref 5.0–8.0)

## 2019-03-22 LAB — TROPONIN I (HIGH SENSITIVITY)
Troponin I (High Sensitivity): 7 ng/L (ref <18)
Troponin I (High Sensitivity): 9 ng/L (ref <18)

## 2019-03-22 MED ORDER — POTASSIUM CHLORIDE CRYS ER 20 MEQ PO TBCR
40.0000 meq | EXTENDED_RELEASE_TABLET | Freq: Once | ORAL | Status: AC
  Administered 2019-03-22: 40 meq via ORAL
  Filled 2019-03-22: qty 2

## 2019-03-22 MED ORDER — AMLODIPINE BESYLATE 5 MG PO TABS
5.0000 mg | ORAL_TABLET | Freq: Once | ORAL | Status: AC
  Administered 2019-03-22: 5 mg via ORAL
  Filled 2019-03-22: qty 1

## 2019-03-22 MED ORDER — CLONIDINE HCL 0.1 MG PO TABS: 0.1000 mg | ORAL_TABLET | Freq: Once | ORAL | Status: DC

## 2019-03-22 MED ORDER — HYDRALAZINE HCL 20 MG/ML IJ SOLN
10.0000 mg | Freq: Once | INTRAMUSCULAR | Status: AC
  Administered 2019-03-22: 10 mg via INTRAVENOUS
  Filled 2019-03-22: qty 1

## 2019-03-22 NOTE — ED Notes (Signed)
Pt ambulatory to toilet with a steady gait at this time.

## 2019-03-22 NOTE — ED Provider Notes (Signed)
[facility] Emergency Department Provider Note   ____________________________________________   First MD Initiated Contact with Patient 03/22/19 [phone]     (approximate)  I have reviewed the triage vital signs and the nursing notes.   HISTORY  Chief Complaint Dizziness and Hypertension    HPI Kristi Maldonado is a 79 y.o. female who presents to the ED from home with a chief complaint of dizziness, lightheadedness and elevated blood pressure.  Patient takes amlodipine 5 mg daily and has taken it for a couple of years.  Reports the above symptoms and took her blood pressure at home which was in the 200s systolic.  Patient states her blood pressure has never been that high and she got scared and wanted to be evaluated.  Had some nausea in the pit of her stomach which has since resolved.  Denies fever, cough, chest pain, shortness of breath, abdominal pain, vomiting.  Denies slurred speech, confusion, extremity weakness/numbness or difficulty ambulating.  She is currently on a "pessary vacation" and has noted some discomfort on urination.       Past Medical History:  Diagnosis Date  . Difficult airway for intubation    Patient told by anesthesiology that she should tell future providers to always use a pediatric ET tube due to narrow airway and prior surgical issues including scarring  . Hypertension   . Thyroid disease     Patient Active Problem List   Diagnosis Date Noted  . Allergic reaction 06/09/2017  . DIVERTICULITIS, COLON 02/05/2009  . IBS 02/05/2009    Past Surgical History:  Procedure Laterality Date  . SALIVARY GLAND SURGERY    . THYROID SURGERY      Prior to Admission medications   Medication Sig Start Date End Date Taking? Authorizing Provider  amLODipine (NORVASC) 5 MG tablet Take 1 tablet (5 mg total) by mouth daily. 06/11/17   Adrian Saran, MD  levothyroxine (SYNTHROID, LEVOTHROID) 75 MCG tablet Take 75 mcg by mouth daily before breakfast.     [provider]  metaxalone (SKELAXIN) 800 MG tablet Take 400-800 mg by mouth 2 (two) times daily as needed for pain. 03/12/17   [provider]    Allergies Lisinopril and Penicillins  Family History  Problem Relation Age of Onset  . Breast cancer Maternal Aunt        two aunts  . Breast cancer Cousin     Social History Social History   Tobacco Use  . Smoking status: Never Smoker  . Smokeless tobacco: Never Used  Substance Use Topics  . Alcohol use: No  . Drug use: No    Review of Systems  Constitutional: No fever/chills Eyes: No visual changes. ENT: No sore throat. Cardiovascular: Denies chest pain. Respiratory: Denies shortness of breath. Gastrointestinal: No abdominal pain.  Positive for nausea, no vomiting.  No diarrhea.  No constipation. Genitourinary: Negative for dysuria. Musculoskeletal: Negative for back pain. Skin: Negative for rash. Neurological: Positive for dizziness.  Negative for headaches, focal weakness or numbness.   ____________________________________________   PHYSICAL EXAM:  VITAL SIGNS: ED Triage Vitals  Enc Vitals Group     BP 03/21/19 2019 (!) 207/76     Pulse Rate 03/21/19 2019 61     Resp 03/22/19 0222 17     Temp 03/21/19 2019 98.1 F (36.7 C)     Temp Source 03/21/19 2019 Oral     SpO2 03/21/19 2019 97 %     Weight 03/21/19 2022 150 lb (68 kg)  Height 03/21/19 2022 5\' 8"  (1.727 m)     Head Circumference --      Peak Flow --      Pain Score 03/21/19 2022 0     Pain Loc --      Pain Edu? --      Excl. in GC? --     Constitutional: Alert and oriented. Well appearing and in no acute distress. Eyes: Conjunctivae are normal. PERRL. EOMI. Head: Atraumatic. Nose: No congestion/rhinnorhea. Mouth/Throat: Mucous membranes are moist.  Oropharynx non-erythematous. Neck: No stridor.  No carotid bruits.  Supple neck without meningismus. Cardiovascular: Normal rate, regular rhythm. Grossly normal heart sounds.   Good peripheral circulation. Respiratory: Normal respiratory effort.  No retractions. Lungs CTAB. Gastrointestinal: Soft and nontender. No distention. No abdominal bruits. No CVA tenderness. Musculoskeletal: No lower extremity tenderness nor edema.  No joint effusions. Neurologic: Alert and oriented x3.  CN II-XII grossly intact.  Normal speech and language. No gross focal neurologic deficits are appreciated. No gait instability. Skin:  Skin is warm, dry and intact. No rash noted. Psychiatric: Mood and affect are normal. Speech and behavior are normal.  ____________________________________________   LABS (all labs ordered are listed, but only abnormal results are displayed)  Labs Reviewed  BASIC METABOLIC PANEL - Abnormal; Notable for the following components:      Result Value   Potassium 3.1 (*)    Glucose, Bld 112 (*)    Creatinine, Ser 1.15 (*)    GFR calc non Af Amer 45 (*)    GFR calc Af Amer 52 (*)    All other components within normal limits  CBC - Abnormal; Notable for the following components:   RBC 3.78 (*)    Hemoglobin 11.9 (*)    HCT 35.4 (*)    All other components within normal limits  URINALYSIS, COMPLETE (UACMP) WITH MICROSCOPIC  TROPONIN I (HIGH SENSITIVITY)  TROPONIN I (HIGH SENSITIVITY)   ____________________________________________  EKG  ED ECG REPORT I, Hollee Fate J, the attending physician, personally viewed and interpreted this ECG.   Date: 03/22/2019  EKG Time: 2032  Rate: 56  Rhythm: sinus bradycardia  Axis: Normal  Intervals:none  ST&T Change: Nonspecific  ____________________________________________  RADIOLOGY  ED MD interpretation: No ICH my: No acute cardiopulmonary process  Official radiology report(s): CT Head Wo Contrast  Result Date: 03/21/2019 CLINICAL DATA:  Dizziness and lightheadedness EXAM: CT HEAD WITHOUT CONTRAST TECHNIQUE: Contiguous axial images were obtained from the base of the skull through the vertex without  intravenous contrast. COMPARISON:  03/08/2015 FINDINGS: Brain: No evidence of acute infarction, hemorrhage, hydrocephalus, extra-axial collection or mass lesion/mass effect. Vascular: No hyperdense vessel or unexpected calcification. Skull: Normal. Negative for fracture or focal lesion. Sinuses/Orbits: No acute finding. Other: None. IMPRESSION: No acute intracranial abnormality noted. Electronically Signed   By: 05/06/2015 M.D.   On: 03/21/2019 23:41   DG Chest Port 1 View  Result Date: 03/22/2019 CLINICAL DATA:  Hypertension, dizziness EXAM: PORTABLE CHEST 1 VIEW COMPARISON:  None. FINDINGS: Normal mediastinum and cardiac silhouette. Normal pulmonary vasculature. No evidence of effusion, infiltrate, or pneumothorax. No acute bony abnormality. IMPRESSION: No acute cardiopulmonary process. Electronically Signed   By: 03/24/2019 M.D.   On: 03/22/2019 06:38    ____________________________________________   PROCEDURES  Procedure(s) performed (including Critical Care):  Procedures   ____________________________________________   INITIAL IMPRESSION / ASSESSMENT AND PLAN / ED COURSE  As part of my medical decision making, I reviewed the following data within the electronic medical  record:  Nursing notes reviewed and incorporated, Labs reviewed, EKG interpreted, Old chart reviewed, Radiograph reviewed and Notes from prior ED visits     Kristi Maldonado was evaluated in Emergency Department on 03/22/2019 for the symptoms described in the history of present illness. She was evaluated in the context of the global COVID-19 pandemic, which necessitated consideration that the patient might be at risk for infection with the SARS-CoV-2 virus that causes COVID-19. Institutional protocols and algorithms that pertain to the evaluation of patients at risk for COVID-19 are in a state of rapid change based on information released by regulatory bodies including the CDC and federal and state organizations. These  policies and algorithms were followed during the patient's care in the ED.    79 year old female with hypertension on Amlodipine who presents with dizziness. Differential diagnosis includes but is not limited to CVA, TIA, metabolic, infectious etiologies, etc.   Will repeat troponin, obtain cxr, UA, MRI brain. Recheck BP.   Clinical Course as of Mar 21 706  Sat Mar 22, 2019  0708 Care transferred at change of shift to Dr. Archie Balboa pending MRI, urinalysis and blood pressure reassessment.   [JS]    Clinical Course User Index [JS] Paulette Blanch, MD     ____________________________________________   FINAL CLINICAL IMPRESSION(S) / ED DIAGNOSES  Final diagnoses:  Dizziness  Essential hypertension  Hypokalemia     ED Discharge Orders    None       Note:  This document was prepared using Dragon voice recognition software and may include unintentional dictation errors.   Paulette Blanch, MD 03/22/19 [phone]

## 2019-03-22 NOTE — ED Notes (Signed)
Pt given water, juice, crackers and peanut butter per pt's request.

## 2019-03-22 NOTE — Discharge Instructions (Signed)
Please seek medical attention for any high fevers, chest pain, shortness of breath, change in behavior, persistent vomiting, bloody stool or any other new or concerning symptoms.  

## 2019-03-22 NOTE — ED Notes (Signed)
Pt confirms ride home (neighbour).

## 2019-03-22 NOTE — ED Notes (Signed)
BP 190/77 at this time. This RN has noticed MD Derrill Kay. Pt A/Ox4 talking to  The phone with "niece" speaking in clear sentences. Denies vision changes/HA.

## 2019-03-22 NOTE — ED Notes (Signed)
Pt c/o HA, st "haven't eat anything since noon yesterday". No vision changes.

## 2019-03-22 NOTE — ED Notes (Signed)
ED Provider Goodman at bedside. 

## 2019-03-22 NOTE — ED Notes (Signed)
Pt ambulated to toilet with a steady gait. Pt back on bed safely and monitor on.

## 2019-03-22 NOTE — ED Notes (Signed)
Patient returned from MRI.

## 2019-03-22 NOTE — ED Notes (Signed)
Pt back in bed, c/o felling " lightheaded"

## 2019-03-22 NOTE — ED Provider Notes (Signed)
MRI without acute findings. UA not consistent with infection. Discussed these results with the patient. Blood pressure remained high although improved after hydralazine. Patient did feel better. At this point think patient is safe for discharge. Encouraged pcp follow up.   Phineas Semen, MD 03/22/19 [phone]

## 2019-06-02 ENCOUNTER — Other Ambulatory Visit: Payer: Self-pay | Admitting: Internal Medicine

## 2019-06-02 DIAGNOSIS — Z1231 Encounter for screening mammogram for malignant neoplasm of breast: Secondary | ICD-10-CM

## 2019-06-17 ENCOUNTER — Ambulatory Visit
Admission: RE | Admit: 2019-06-17 | Discharge: 2019-06-17 | Disposition: A | Discharge status: Home / Self Care | Payer: Medicare Other | Source: Ambulatory Visit | Attending: Internal Medicine | Admitting: Internal Medicine

## 2019-06-17 DIAGNOSIS — Z1231 Encounter for screening mammogram for malignant neoplasm of breast: Secondary | ICD-10-CM | POA: Insufficient documentation

## 2019-06-18 ENCOUNTER — Other Ambulatory Visit: Payer: Self-pay | Admitting: Internal Medicine

## 2019-06-18 DIAGNOSIS — N6489 Other specified disorders of breast: Secondary | ICD-10-CM

## 2019-06-18 DIAGNOSIS — R928 Other abnormal and inconclusive findings on diagnostic imaging of breast: Secondary | ICD-10-CM

## 2019-07-03 ENCOUNTER — Ambulatory Visit
Admission: RE | Admit: 2019-07-03 | Discharge: 2019-07-03 | Disposition: A | Discharge status: Home / Self Care | Payer: Medicare Other | Source: Ambulatory Visit | Attending: Internal Medicine | Admitting: Internal Medicine

## 2019-07-03 DIAGNOSIS — N6489 Other specified disorders of breast: Secondary | ICD-10-CM | POA: Diagnosis present

## 2019-07-03 DIAGNOSIS — R928 Other abnormal and inconclusive findings on diagnostic imaging of breast: Secondary | ICD-10-CM

## 2019-07-07 ENCOUNTER — Ambulatory Visit: Payer: Medicare Other | Admitting: Dermatology

## 2019-09-15 ENCOUNTER — Encounter: Payer: Self-pay | Admitting: Physical Therapy

## 2019-09-15 ENCOUNTER — Ambulatory Visit: Payer: Medicare Other | Attending: Obstetrics and Gynecology | Admitting: Physical Therapy

## 2019-09-15 ENCOUNTER — Other Ambulatory Visit: Payer: Self-pay

## 2019-09-15 DIAGNOSIS — R278 Other lack of coordination: Secondary | ICD-10-CM | POA: Diagnosis present

## 2019-09-15 DIAGNOSIS — M5442 Lumbago with sciatica, left side: Secondary | ICD-10-CM | POA: Insufficient documentation

## 2019-09-15 DIAGNOSIS — R2689 Other abnormalities of gait and mobility: Secondary | ICD-10-CM | POA: Insufficient documentation

## 2019-09-15 DIAGNOSIS — G8929 Other chronic pain: Secondary | ICD-10-CM | POA: Insufficient documentation

## 2019-09-15 DIAGNOSIS — M62838 Other muscle spasm: Secondary | ICD-10-CM

## 2019-09-15 NOTE — Patient Instructions (Addendum)
°  Clam Shell 45 Degrees   Lying with hips and knees bent 45, one pillow between knees and ankles. Lift knee with exhale. Be sure pelvis does not roll backward. Do not arch back. Do 30 times, each leg, 2 times per day.    Complimentary stretch: [employer] _ foot over _ thigh, opposite knee straight  3 breaths  * Keep pelvis levelled with tactile cue with hand under back of hips  * Slide the ankle of the supporting foot out to decrease the angle which can help level the pelvis    ___  PELVIC FLOOR / KEGEL EXERCISES   Pelvic floor/ Kegel exercises are used to strengthen the muscles in the base of your pelvis that are responsible for supporting your pelvic organs and preventing urine/feces leakage. Based on your therapists recommendations, they can be performed while standing, sitting, or lying down. Imagine pelvic floor area as a diamond with pelvic landmarks: top =pubic bone, bottom tip=tailbone, sides=sitting bones (ischial tuberosities).    Make yourself aware of this muscle group by using these cues while coordinating your breath:  Inhale, feel pelvic floor diamond area lower like hammock towards your feet and ribcage/belly expanding. Pause. Let the exhale naturally and feel your belly sink, abdominal muscles hugging in around you and you may notice the pelvic diamond draws upward towards your head forming a umbrella shape. Give a squeeze during the exhalation like you are stopping the flow of urine. If you are squeezing the buttock muscles, try to give 50% less effort.   Common Errors:  Breath holding: If you are holding your breath, you may be bearing down against your bladder instead of pulling it up. If you belly bulges up while you are squeezing, you are holding your breath. Be sure to breathe gently in and out while exercising. Counting out loud may help you avoid holding your breath.  Accessory muscle use: You should not see or feel other muscle movement when  performing pelvic floor exercises. When done properly, no one can tell that you are performing the exercises. Keep the buttocks, belly and inner thighs relaxed.  Overdoing it: Your muscles can fatigue and stop working for you if you over-exercise. You may actually leak more or feel soreness at the lower abdomen or rectum.  YOUR HOME EXERCISE PROGRAM     SHORT HOLDS: Position: on back ( put pillow under hips)   Inhale and then exhale. Then squeeze the muscle.  (Be sure to let belly sink in with exhales and not push outward)  Perform10 repetitions, 3  Times/day                      DECREASE DOWNWARD PRESSURE ON  YOUR PELVIC FLOOR, ABDOMINAL, LOW BACK MUSCLES       PRESERVE YOUR PELVIC HEALTH LONG-TERM   ** SQUEEZE pelvic floor BEFORE YOUR SNEEZE, COUGH, LAUGH   ** EXHALE BEFORE YOU RISE AGAINST GRAVITY (lifting, sit to stand, from squat to stand)   ** LOG ROLL OUT OF BED INSTEAD OF CRUNCH/SIT-UP

## 2019-09-16 NOTE — Therapy (Addendum)
[facility] MAIN [facility] SERVICES [address] [place], [place], [address] Phone: [phone]   Fax:  [phone]  Physical Therapy Evaluation  Patient Details  Name: Kristi Maldonado MRN: [id number] Date of Birth: 1940/04/17 Referring Provider (PT): Cristopher Peru    Encounter Date: 09/15/2019    Past Medical History:  Diagnosis Date  . Difficult airway for intubation    Patient told by anesthesiology that she should tell future providers to always use a pediatric ET tube due to narrow airway and prior surgical issues including scarring  . Hypertension   . Thyroid disease     Past Surgical History:  Procedure Laterality Date  . SALIVARY GLAND SURGERY    . THYROID SURGERY      There were no vitals filed for this visit.    Subjective Assessment - 09/15/19 1415    Subjective 1) Urgency and incomplete emptying:  Pt gave up on her pessary. Pt is not interested in getting another one since the removal from a week ago. This removal was so bad, her vaginal tissues were torn and she had to have stitches.  On August 20, pt will have a a urodynamic test for her bladder. Since removal of the pessary, pt has not had nearly as much leakage. With the pessary, pt experienced uncontrollable leakage. Pt was going through so many pads. Pt has urgency and she can not always eliminate completely without using finger to push up the bladder out of the way. Pt did not keep up with her pelvic floor exercises nor the other PT exercises  from last year's PT sessions. Pt would like to revisit theses exercises .   Pt no longer has LBP/ hip pain except from time to time. When she does, pt can correct it with Tylenol. Pt is taking exercise classes with machines ( hip abduction / adduction) and upper body with low weights. Pt is working with trainer one- on-one .  Pt is walking 1.5 miles across 3 x week.    2) Constipation: Pt has bowel movement was 2 days ago. Pt drinking 1.5 glass of  water. Type 3 Bristol / Type 4 Bristol Stool Type 50% / 50%     Patient Stated Goals get muscles working better to not have so much of a prolapse              [facility] PT Assessment - 09/25/19 0824      Assessment   Medical Diagnosis mixed incontinence     Referring Provider (PT) O'Shea       Precautions   Precautions None      Restrictions   Weight Bearing Restrictions No      Balance Screen   Has the patient fallen in the past 6 months No      Prior Function   Level of Independence Independent      Single Leg Stance   Comments RLE w/ shoulder flexion 8 sec, L  12 sec      Sit to Stand   Comments genu valgus       Strength   Overall Strength Comments hip abd B 3/5, hip ext R 3/5, hip ext L 4/5       Palpation   Spinal mobility no deviations     SI assessment  levelled iliac crests                       Objective measurements completed on examination: See above findings.  Pelvic Floor Special Questions - 09/25/19 0828    Pelvic Floor Internal Exam pt consent verbally without contraindications     Exam Type Vaginal    Palpation supine , bladder inside introitus w/ dyscoordination , standing, beyond introitus with no upward lift with cue for contraction     Strength weak squeeze, no lift   downward push from ab muscles            [facility] Adult PT Treatment/Exercise - 09/25/19 6144      Neuro Re-ed    Neuro Re-ed Details  cued for clams and less ab overuse with pelvic floor contractions                       PT Long Term Goals - 09/24/19 1020      PT LONG TERM GOAL #1   Title Pt will demo increased hip abd strength from 3/5 to 4+/5 B in order to improve balance/ pelvic girdle stability/ promote pelvic floor mm balance to minimize prolapse worsening    Time 4    Period Weeks    Status New    Target Date 10/29/19      PT LONG TERM GOAL #2   Title Pt will demo improved pelvic floor coordination and upward lift inside introitus  in standing to minimzie lowered position of bladder    Baseline bladder outside introitus in standing    Time 8    Period Weeks    Status New    Target Date 11/12/19      PT LONG TERM GOAL #3   Title Pt will report improved bowel movements daily without straining, Stool Type 4 will 75%, Type 5 25% of the time    Baseline Stool Type 4 will 50%, Type 5 50% of the time    Time 10    Period Weeks    Status New    Target Date 11/26/19      PT LONG TERM GOAL #4   Title Pt will not need to use her finger to push up the bladder out of the way when she first gets up in the morning in order to practice improved hygiene    Time 10    Period Weeks    Status New    Target Date 12/03/19      PT LONG TERM GOAL #5   Title Pt will be able to report complete emptying of the bladder 75% of the time in order to minimzie risk for UTIs    Time 6    Period Weeks    Status New    Target Date 11/05/19                  Plan - 09/15/19 1459    Clinical Impression Statement Pt is an 79 year old who experiences urgency and she can not always eliminate completely without using finger to push up the bladder out of the way. Pt did not keep up with her pelvic floor exercises nor the other PT exercises  from last year's PT sessions. Pt would like to revisit theses exercises. Pt had her pessary removed one week ago with complications that require stitches.  Pt showed dyscoordination and strength of pelvic floor mm, weak hip weakness, poor body mechanics which places strain on the abdominal/pelvic floor mm, and lack of knowledge with proper fitness exercises in the gym setting that minimize worsening of prolapse.  Prolapse was visible outside her introitus in standing  position with limited upward movement of pelvic floor with cue for pelvic floor contraction. In hooklying position, prolapse was inside introitus with poor contraction of pelvic floor mm.  These are deficits that indicate an ineffective  intraabdominal pressure system associated with increased risk for pt's Sx.  Pt will benefit from coordination training and education on fitness and functional positions in order to gain a more effective intraabdominal pressure system to minimize her Sx.  Good carryover from last year's PT session include no more deviations to spine and equal alignment of pelvic girdle. Pt has significantly decreased LBP/ L hip pain.   Pt was provided education on etiology of Sx with anatomy, physiology explanation with images along with the benefits of customized pelvT Tx based on pt's medical conditions and musculoskeletal deficits.  Explained the physiology of deep core mm coordination and roles of pelvic floor function in urination, defecation, sexual function, and postural control with deep core mm system.  Following Tx today which pt demo'd IND with hip strengthening and stretching exercises. Pt benefits from skilled PT         Stability/Clinical Decision Making Evolving/Moderate complexity    Clinical Decision Making Moderate    Rehab Potential Good    PT Frequency 1x / week    PT Duration Other (comment)   10   PT Treatment/Interventions Stair training;Functional mobility training;Therapeutic activities;Therapeutic exercise;Balance training;Neuromuscular re-education;Moist Heat;Patient/family education;Manual techniques;Scar mobilization;Cryotherapy;Taping    Consulted and Agree with Plan of Care Patient           Patient will benefit from skilled therapeutic intervention in order to improve the following deficits and impairments:  Decreased mobility, Decreased coordination, Decreased strength, Decreased range of motion, Increased muscle spasms, Improper body mechanics, Postural dysfunction, Difficulty walking, Hypomobility, Decreased safety awareness, Increased fascial restricitons, Decreased scar mobility, Decreased endurance  Visit Diagnosis: Other muscle spasm  Other lack of  coordination     Problem List Patient Active Problem List   Diagnosis Date Noted  . Allergic reaction 06/09/2017  . DIVERTICULITIS, COLON 02/05/2009  . IBS 02/05/2009    Mariane Masters ,PT, DPT, E-RYT  09/25/2019, 8:28 AM  [facility] MAIN [facility] SERVICES [address] [place], [place], [address] Phone: [phone]   Fax:  [phone]  Name: Kristi Maldonado MRN: [id number] Date of Birth: Nov 20, 1940

## 2019-09-24 ENCOUNTER — Ambulatory Visit: Payer: Medicare Other | Admitting: Physical Therapy

## 2019-09-24 ENCOUNTER — Other Ambulatory Visit: Payer: Self-pay

## 2019-09-24 DIAGNOSIS — M62838 Other muscle spasm: Secondary | ICD-10-CM | POA: Diagnosis not present

## 2019-09-24 DIAGNOSIS — R2689 Other abnormalities of gait and mobility: Secondary | ICD-10-CM

## 2019-09-24 DIAGNOSIS — M5442 Lumbago with sciatica, left side: Secondary | ICD-10-CM

## 2019-09-24 DIAGNOSIS — R278 Other lack of coordination: Secondary | ICD-10-CM

## 2019-09-24 NOTE — Patient Instructions (Signed)
Bridging:   Position: arms by hips pressing to floor, , heels under knees    Movement: inhale do nothing, exhale lift hips while pressings stabilization points: shoulders, upper arms, back of head pressed into floor. Heel press downward.  10 x 2 reps  ___ Quad stretch Sidelying Quad stretch Grab ankle   ____  Clams:  Heel together, toes apart

## 2019-09-25 NOTE — Therapy (Signed)
[facility] MAIN [facility] SERVICES [address] [place], [place], [address] Phone: [phone]   Fax:  [phone]  Physical Therapy Treatment  Patient Details  Name: Kristi Maldonado MRN: [id number] Date of Birth: 1941/01/15 Referring Provider (PT): Cristopher Peru    Encounter Date: 09/24/2019   PT End of Session - 09/24/19 1109    Visit Number 2    Number of Visits 10    Date for PT Re-Evaluation 11/24/19    PT Start Time 1000    PT Stop Time 1100    PT Time Calculation (min) 60 min           Past Medical History:  Diagnosis Date  . Difficult airway for intubation    Patient told by anesthesiology that she should tell future providers to always use a pediatric ET tube due to narrow airway and prior surgical issues including scarring  . Hypertension   . Thyroid disease     Past Surgical History:  Procedure Laterality Date  . SALIVARY GLAND SURGERY    . THYROID SURGERY      There were no vitals filed for this visit.   Subjective Assessment - 09/25/19 0849    Subjective Pt has questions about the clam shell exercise    Patient Stated Goals get muscles working better to not have so much of a prolapse              [facility] PT Assessment - 09/25/19 0851      Observation/Other Assessments   Observations bridging w/ ankles closer to gluts  initially caused R leg cramp , post Tx: no cramp       Other:   Other/ Comments clam position and heel propioception with poor body awareness      Palpation   Palpation comment quad, adductor, semimembranosus mm tensions                          [facility] Adult PT Treatment/Exercise - 09/25/19 0849      Neuro Re-ed    Neuro Re-ed Details  cued for clams and less ab overuse with pelvic floor contractions      Exercises   Exercises --   see pt instructions     Manual Therapy   Manual therapy comments STM/MWM quad, adductor, semimembranosus R                        PT Long  Term Goals - 09/24/19 1020      PT LONG TERM GOAL #1   Title Pt will demo increased hip abd strength from 3/5 to 4+/5 B in order to improve balance/ pelvic girdle stability/ promote pelvic floor mm balance to minimize prolapse worsening    Time 4    Period Weeks    Status New    Target Date 10/29/19      PT LONG TERM GOAL #2   Title Pt will demo improved pelvic floor coordination and upward lift inside introitus in standing to minimzie lowered position of bladder    Baseline bladder outside introitus in standing    Time 8    Period Weeks    Status New    Target Date 11/12/19      PT LONG TERM GOAL #3   Title Pt will report improved bowel movements daily without straining, Stool Type 4 will 75%, Type 5 25% of the time    Baseline Stool Type  4 will 50%, Type 5 50% of the time    Time 10    Period Weeks    Status New    Target Date 11/26/19      PT LONG TERM GOAL #4   Title Pt will not need to use her finger to push up the bladder out of the way when she first gets up in the morning in order to practice improved hygiene    Time 10    Period Weeks    Status New    Target Date 12/03/19      PT LONG TERM GOAL #5   Title Pt will be able to report complete emptying of the bladder 75% of the time in order to minimzie risk for UTIs    Time 6    Period Weeks    Status New    Target Date 11/05/19                 Plan - 09/25/19 0845    Clinical Impression Statement Pt required cues for proper technique with clam shells and manual Tx to minimize R LE mm tightness to minimize cramping with bridging exercise. Bridge exercising was provided to improve hip strength and facilitate more upward position of pelvic organs. Pt continues to benefit from skilled PT    Stability/Clinical Decision Making Evolving/Moderate complexity    Rehab Potential Good    PT Frequency 1x / week    PT Duration Other (comment)   10   PT Treatment/Interventions Stair training;Functional mobility  training;Therapeutic activities;Therapeutic exercise;Balance training;Neuromuscular re-education;Moist Heat;Patient/family education;Manual techniques;Scar mobilization;Cryotherapy;Taping    Consulted and Agree with Plan of Care Patient           Patient will benefit from skilled therapeutic intervention in order to improve the following deficits and impairments:  Decreased mobility, Decreased coordination, Decreased strength, Decreased range of motion, Increased muscle spasms, Improper body mechanics, Postural dysfunction, Difficulty walking, Hypomobility, Decreased safety awareness, Increased fascial restricitons, Decreased scar mobility, Decreased endurance  Visit Diagnosis: Other muscle spasm  Other lack of coordination  Chronic low back pain with left-sided sciatica, unspecified back pain laterality  Other abnormalities of gait and mobility     Problem List Patient Active Problem List   Diagnosis Date Noted  . Allergic reaction 06/09/2017  . DIVERTICULITIS, COLON 02/05/2009  . IBS 02/05/2009    Mariane Masters ,PT, DPT, E-RYT  09/25/2019, 8:54 AM  [facility] MAIN [facility] SERVICES [address] [place], [place], [address] Phone: [phone]   Fax:  [phone]  Name: Kristi Maldonado MRN: [id number] Date of Birth: 01-27-1941

## 2019-09-25 NOTE — Addendum Note (Signed)
Addended by: Mariane Masters on: 09/25/2019 08:29 AM   Modules accepted: Orders

## 2019-10-01 ENCOUNTER — Other Ambulatory Visit: Payer: Self-pay

## 2019-10-01 ENCOUNTER — Ambulatory Visit: Payer: Medicare Other | Admitting: Physical Therapy

## 2019-10-01 DIAGNOSIS — M62838 Other muscle spasm: Secondary | ICD-10-CM | POA: Diagnosis not present

## 2019-10-01 DIAGNOSIS — M5442 Lumbago with sciatica, left side: Secondary | ICD-10-CM

## 2019-10-01 DIAGNOSIS — R2689 Other abnormalities of gait and mobility: Secondary | ICD-10-CM

## 2019-10-01 DIAGNOSIS — R278 Other lack of coordination: Secondary | ICD-10-CM

## 2019-10-01 NOTE — Therapy (Signed)
[facility] MAIN [facility] SERVICES [address] [place], [place], [address] Phone: [phone]   Fax:  [phone]  Physical Therapy Treatment  Patient Details  Name: Kristi Maldonado MRN: [id number] Date of Birth: March 14, 1940 Referring Provider (PT): Cristopher Peru    Encounter Date: 10/01/2019   PT End of Session - 10/01/19 1115    Visit Number 3    Number of Visits 10    Date for PT Re-Evaluation 11/24/19    PT Start Time 0908    PT Stop Time 1003    PT Time Calculation (min) 55 min           Past Medical History:  Diagnosis Date  . Difficult airway for intubation    Patient told by anesthesiology that she should tell future providers to always use a pediatric ET tube due to narrow airway and prior surgical issues including scarring  . Hypertension   . Thyroid disease     Past Surgical History:  Procedure Laterality Date  . SALIVARY GLAND SURGERY    . THYROID SURGERY      There were no vitals filed for this visit.   Subjective Assessment - 10/01/19 1015    Subjective Pt did not experience any more calf cramps when doing the bridges    Patient Stated Goals get muscles working better to not have so much of a prolapse                          Pelvic Floor Special Questions - 10/01/19 1044    Pelvic Floor Internal Exam pt consent verbally without contraindications     Exam Type Vaginal    Palpation supine: increased anterior triangle mm tightness ( decreased post Tx) ,. bladder position more cranial.  hips elevated with 2 folded towels     Strength fair squeeze, definite lift   increased sequential / circumferential recruitment            [facility] Adult PT Treatment/Exercise - 10/01/19 1048      Neuro Re-ed    Neuro Re-ed Details  cued for more feet co-activation and alignment of lower kinetic chain with pelvic floor coordination training, deep core training       Modalities   Modalities Moist Heat      Moist Heat  Therapy   Number Minutes Moist Heat 5 Minutes    Moist Heat Location --   perineum     Manual Therapy   Internal Pelvic Floor STM/MWM w/ coordinated breathing, urethra compressae, ischiocavernosus, bulbospongiosus B                        PT Long Term Goals - 09/24/19 1020      PT LONG TERM GOAL #1   Title Pt will demo increased hip abd strength from 3/5 to 4+/5 B in order to improve balance/ pelvic girdle stability/ promote pelvic floor mm balance to minimize prolapse worsening    Time 4    Period Weeks    Status New    Target Date 10/29/19      PT LONG TERM GOAL #2   Title Pt will demo improved pelvic floor coordination and upward lift inside introitus in standing to minimzie lowered position of bladder    Baseline bladder outside introitus in standing    Time 8    Period Weeks    Status New    Target Date 11/12/19  PT LONG TERM GOAL #3   Title Pt will report improved bowel movements daily without straining, Stool Type 4 will 75%, Type 5 25% of the time    Baseline Stool Type 4 will 50%, Type 5 50% of the time    Time 10    Period Weeks    Status New    Target Date 11/26/19      PT LONG TERM GOAL #4   Title Pt will not need to use her finger to push up the bladder out of the way when she first gets up in the morning in order to practice improved hygiene    Time 10    Period Weeks    Status New    Target Date 12/03/19      PT LONG TERM GOAL #5   Title Pt will be able to report complete emptying of the bladder 75% of the time in order to minimzie risk for UTIs    Time 6    Period Weeks    Status New    Target Date 11/05/19                 Plan - 10/01/19 1156    Clinical Impression Statement Pt demo'd no more abdominal straining with pelvic floor training. Pt elicited more lengthening of anterior pelvic floor mm with cues and manual Tx to minimzie tightness. Progressed pt to quick contractions with pillow under hips to eliminate gravity  further which will help with lowered bladder position. Pt continues to benefit from skilled PT   Stability/Clinical Decision Making Evolving/Moderate complexity    Rehab Potential Good    PT Frequency 1x / week    PT Duration Other (comment)   10   PT Treatment/Interventions Stair training;Functional mobility training;Therapeutic activities;Therapeutic exercise;Balance training;Neuromuscular re-education;Moist Heat;Patient/family education;Manual techniques;Scar mobilization;Cryotherapy;Taping    Consulted and Agree with Plan of Care Patient           Patient will benefit from skilled therapeutic intervention in order to improve the following deficits and impairments:  Decreased mobility, Decreased coordination, Decreased strength, Decreased range of motion, Increased muscle spasms, Improper body mechanics, Postural dysfunction, Difficulty walking, Hypomobility, Decreased safety awareness, Increased fascial restricitons, Decreased scar mobility, Decreased endurance  Visit Diagnosis: Other lack of coordination  Other muscle spasm  Chronic low back pain with left-sided sciatica, unspecified back pain laterality  Other abnormalities of gait and mobility     Problem List Patient Active Problem List   Diagnosis Date Noted  . Allergic reaction 06/09/2017  . DIVERTICULITIS, COLON 02/05/2009  . IBS 02/05/2009    Mariane Masters ,PT, DPT, E-RYT  10/01/2019, 11:57 AM  [facility] [facility] MAIN [facility] SERVICES [address] [place], [place], [address] Phone: [phone]   Fax:  [phone]  Name: Kristi Maldonado MRN: [id number] Date of Birth: 1940/05/21

## 2019-10-01 NOTE — Patient Instructions (Signed)
PELVIC FLOOR / KEGEL EXERCISES   Pelvic floor/ Kegel exercises are used to strengthen the muscles in the base of your pelvis that are responsible for supporting your pelvic organs and preventing urine/feces leakage. Based on your therapist's recommendations, they can be performed while standing, sitting, or lying down. Imagine pelvic floor area as a diamond with pelvic landmarks: top =pubic bone, bottom tip=tailbone, sides=sitting bones (ischial tuberosities).    Make yourself aware of this muscle group by using these cues while coordinating your breath:  Inhale, feel pelvic floor diamond area lower like hammock towards your feet and ribcage/belly expanding. Pause. Let the exhale naturally and feel your belly sink, abdominal muscles hugging in around you and you may notice the pelvic diamond draws upward towards your head forming a umbrella shape. Give a squeeze during the exhalation like you are stopping the flow of urine. If you are squeezing the buttock muscles, try to give 50% less effort.   Common Errors:  Breath holding: If you are holding your breath, you may be bearing down against your bladder instead of pulling it up. If you belly bulges up while you are squeezing, you are holding your breath. Be sure to breathe gently in and out while exercising. Counting out loud may help you avoid holding your breath.  Accessory muscle use: You should not see or feel other muscle movement when performing pelvic floor exercises. When done properly, no one can tell that you are performing the exercises. Keep the buttocks, belly and inner thighs relaxed.  Overdoing it: Your muscles can fatigue and stop working for you if you over-exercise. You may actually leak more or feel soreness at the lower abdomen or rectum.  YOUR HOME EXERCISE PROGRAM     SHORT HOLDS: Position: on back with pillow under hips   Inhale and then exhale. Then squeeze the muscle.  (Be sure to let belly sink in with exhales and  not push outward)  Perform 10  repetitions, 5  Times/day  ___  .Deep core ( handout)

## 2019-10-08 ENCOUNTER — Ambulatory Visit: Payer: Medicare Other | Admitting: Physical Therapy

## 2019-10-15 ENCOUNTER — Ambulatory Visit: Payer: Medicare Other | Attending: Obstetrics and Gynecology | Admitting: Physical Therapy

## 2019-10-15 ENCOUNTER — Other Ambulatory Visit: Payer: Self-pay

## 2019-10-15 DIAGNOSIS — M533 Sacrococcygeal disorders, not elsewhere classified: Secondary | ICD-10-CM | POA: Diagnosis not present

## 2019-10-15 DIAGNOSIS — M5442 Lumbago with sciatica, left side: Secondary | ICD-10-CM | POA: Insufficient documentation

## 2019-10-15 DIAGNOSIS — M62838 Other muscle spasm: Secondary | ICD-10-CM

## 2019-10-15 DIAGNOSIS — R278 Other lack of coordination: Secondary | ICD-10-CM | POA: Diagnosis not present

## 2019-10-15 DIAGNOSIS — R2689 Other abnormalities of gait and mobility: Secondary | ICD-10-CM | POA: Diagnosis present

## 2019-10-15 DIAGNOSIS — G8929 Other chronic pain: Secondary | ICD-10-CM | POA: Diagnosis present

## 2019-10-15 NOTE — Therapy (Signed)
[place] [facility] MAIN [facility] SERVICES [address] [place], [place], [address] Phone: [phone]   Fax:  [phone]  Physical Therapy Treatment  Patient Details  Name: Kristi Maldonado MRN: [id number] Date of Birth: May 16, 1940 Referring Provider (PT): Cristopher Peru    Encounter Date: 10/15/2019   PT End of Session - 10/15/19 1332    Visit Number 4    Number of Visits 10    Date for PT Re-Evaluation 11/24/19    PT Start Time 1000    PT Stop Time 1100    PT Time Calculation (min) 60 min           Past Medical History:  Diagnosis Date  . Difficult airway for intubation    Patient told by anesthesiology that she should tell future providers to always use a pediatric ET tube due to narrow airway and prior surgical issues including scarring  . Hypertension   . Thyroid disease     Past Surgical History:  Procedure Laterality Date  . SALIVARY GLAND SURGERY    . THYROID SURGERY      There were no vitals filed for this visit.   Subjective Assessment - 10/15/19 1024    Subjective Pt reported she was constipation and had to push with bowel movements. Pt aslo noticed hser stress levels affect dropping of her bladder.    Patient Stated Goals get muscles working better to not have so much of a prolapse              [facility] PT Assessment - 10/15/19 1338      Palpation   Palpation comment increased tensions of plantar fascia, toe abduction       Ambulation/Gait   Gait Comments L supination, poor weight bearing at 1st metatarsal                       Pelvic Floor Special Questions - 10/15/19 1156    Pelvic Floor Internal Exam pt consent verbally without contraindications     Exam Type Vaginal    Palpation supine: increased anterior triangle mm tightness ( decreased post Tx) ,. bladder position more cranial.  hips elevated with 2 folded towels     Strength fair squeeze, definite lift   increased sequential / circumferential recruitment              [facility] Adult PT Treatment/Exercise - 10/15/19 1338      Therapeutic Activites    Therapeutic Activities --   active listening. discussed plan for home gym fitness      Neuro Re-ed    Neuro Re-ed Details  cued for toe abduction/ extension, biofeedback with transverse arch of feet       Manual Therapy   Manual therapy comments STM/MWM , distraction at [facility] joint and dorsal/ plantar mm to promote toe abduction, DF     Internal Pelvic Floor STM/MWM at iliococcygeus/ coccygeus B                        PT Long Term Goals - 09/24/19 1020      PT LONG TERM GOAL #1   Title Pt will demo increased hip abd strength from 3/5 to 4+/5 B in order to improve balance/ pelvic girdle stability/ promote pelvic floor mm balance to minimize prolapse worsening    Time 4    Period Weeks    Status New    Target Date 10/29/19  PT LONG TERM GOAL #2   Title Pt will demo improved pelvic floor coordination and upward lift inside introitus in standing to minimzie lowered position of bladder    Baseline bladder outside introitus in standing    Time 8    Period Weeks    Status New    Target Date 11/12/19      PT LONG TERM GOAL #3   Title Pt will report improved bowel movements daily without straining, Stool Type 4 will 75%, Type 5 25% of the time    Baseline Stool Type 4 will 50%, Type 5 50% of the time    Time 10    Period Weeks    Status New    Target Date 11/26/19      PT LONG TERM GOAL #4   Title Pt will not need to use her finger to push up the bladder out of the way when she first gets up in the morning in order to practice improved hygiene    Time 10    Period Weeks    Status New    Target Date 12/03/19      PT LONG TERM GOAL #5   Title Pt will be able to report complete emptying of the bladder 75% of the time in order to minimzie risk for UTIs    Time 6    Period Weeks    Status New    Target Date 11/05/19                 Plan - 10/15/19 1333     Clinical Impression Statement Pt showed less pelvic floor mm tightness at anterior mm but required manual Tx to release posterior mm and requried biofeedback through tactile cues for lower kinetic chain co-activation to elicit a stronger pelvic floor contraction. Pt is able to notice when she is overusing her ab mm and self-correct. Bladder position is in normal position.  Provided OKC exercise to strengthening lower kinetic chain and co-activate deep core. lan to progress pt to seated quick pelvic floor contraction next session. Pt discussed she is concerned about the rises in COVID cases and no longer feels safe going to her gym. Discussed devising a fitness program for home use to help pt maintain strength and fitness. Pt continues to benefit from skilled PT    Stability/Clinical Decision Making Evolving/Moderate complexity    Rehab Potential Good    PT Frequency 1x / week    PT Duration Other (comment)   10   PT Treatment/Interventions Stair training;Functional mobility training;Therapeutic activities;Therapeutic exercise;Balance training;Neuromuscular re-education;Moist Heat;Patient/family education;Manual techniques;Scar mobilization;Cryotherapy;Taping    Consulted and Agree with Plan of Care Patient           Patient will benefit from skilled therapeutic intervention in order to improve the following deficits and impairments:  Decreased mobility, Decreased coordination, Decreased strength, Decreased range of motion, Increased muscle spasms, Improper body mechanics, Postural dysfunction, Difficulty walking, Hypomobility, Decreased safety awareness, Increased fascial restricitons, Decreased scar mobility, Decreased endurance  Visit Diagnosis: Other lack of coordination  Other muscle spasm  Chronic low back pain with left-sided sciatica, unspecified back pain laterality  Other abnormalities of gait and mobility     Problem List Patient Active Problem List   Diagnosis Date Noted  .  Allergic reaction 06/09/2017  . DIVERTICULITIS, COLON 02/05/2009  . IBS 02/05/2009    Mariane Masters ,PT, DPT, E-RYT  10/15/2019, 1:40 PM  [place] [facility] MAIN [facility] SERVICES [address]  [place], [place], [address] Phone: [phone]   Fax:  [phone]  Name: Kristi Maldonado MRN: [id number] Date of Birth: 1940-04-13

## 2019-10-15 NOTE — Patient Instructions (Addendum)
WALKING WITH RESISTANCE BLUE Band at waist connected to doorknob Stepping forward normal length steps, planting mid and forefoot down, center of mass ( navel) leans forward slightly as if you were walking uphill 3-4 steps till band feels taut ( MAKE SURE THE DOOR IS LOCKED AND WON'T OPEN)    Stepping backwards, lower heel slowly, carry trunk and hips back as you step   Spread toes, equal weight across ballmounds    Wear manicure toe spreader for 30 min/ day when watching TV, reading,    When doing pelvic floor exercises, press through 4 points of foot, lift toes, press through ballmounds

## 2019-10-22 ENCOUNTER — Other Ambulatory Visit: Payer: Self-pay

## 2019-10-22 ENCOUNTER — Ambulatory Visit: Payer: Medicare Other | Admitting: Physical Therapy

## 2019-10-22 DIAGNOSIS — R2689 Other abnormalities of gait and mobility: Secondary | ICD-10-CM

## 2019-10-22 DIAGNOSIS — G8929 Other chronic pain: Secondary | ICD-10-CM

## 2019-10-22 DIAGNOSIS — M62838 Other muscle spasm: Secondary | ICD-10-CM

## 2019-10-22 DIAGNOSIS — R278 Other lack of coordination: Secondary | ICD-10-CM | POA: Diagnosis not present

## 2019-10-22 DIAGNOSIS — M5442 Lumbago with sciatica, left side: Secondary | ICD-10-CM

## 2019-10-22 NOTE — Patient Instructions (Signed)
PELVIC FLOOR / KEGEL EXERCISES   Pelvic floor/ Kegel exercises are used to strengthen the muscles in the base of your pelvis that are responsible for supporting your pelvic organs and preventing urine/feces leakage. Based on your therapist's recommendations, they can be performed while standing, sitting, or lying down. Imagine pelvic floor area as a diamond with pelvic landmarks: top =pubic bone, bottom tip=tailbone, sides=sitting bones (ischial tuberosities).    Make yourself aware of this muscle group by using these cues while coordinating your breath:  Inhale, feel pelvic floor diamond area lower like hammock towards your feet and ribcage/belly expanding. Pause. Let the exhale naturally and feel your belly sink, abdominal muscles hugging in around you and you may notice the pelvic diamond draws upward towards your head forming a umbrella shape. Give a squeeze during the exhalation like you are stopping the flow of urine. If you are squeezing the buttock muscles, try to give 50% less effort.   Common Errors:  Breath holding: If you are holding your breath, you may be bearing down against your bladder instead of pulling it up. If you belly bulges up while you are squeezing, you are holding your breath. Be sure to breathe gently in and out while exercising. Counting out loud may help you avoid holding your breath.  Accessory muscle use: You should not see or feel other muscle movement when performing pelvic floor exercises. When done properly, no one can tell that you are performing the exercises. Keep the buttocks, belly and inner thighs relaxed.  Overdoing it: Your muscles can fatigue and stop working for you if you over-exercise. You may actually leak more or feel soreness at the lower abdomen or rectum.  YOUR HOME EXERCISE PROGRAM  LONG HOLDS:   Position: on back with folded towel / pillow  ( do not lift head to sit up, instead make sure to use the handle to raise the car seat up and keep  spine/head relaxed to not place load on pelvic floor/ abdominal muscle)     Inhale and then exhale. Then squeeze the muscle and count aloud for 5 seconds. Rest with three long breaths. (Be sure to let belly sink in with exhales and not push outward)  Perform 5 repetitions, 3 different times/day  ___   Deep core level 2 ( 6 min )  Towel under your hips, gentle lift on exhale with pelvic floor  2 x day    ___   PLEATS: BALLERINA  Heel raises in ballerina position, pressing rolled towel between heels  Hands at a corner of a wall   Knees bent pointed out like a "v" , navel ( center of mass) more forward  Heels together as you lift, pointed out like a "v"  KNEES ARE ALIGNED BEHIND THE TOES TO MINIMIZE STRAIN ON THE KNEES Lower heels down slow with your  navel ( center of mass) more forward to avoid dropping down fast and rocking more weight back onto heels   10 reps  X 3 day  ________

## 2019-10-23 NOTE — Therapy (Addendum)
[place] [facility] MAIN [facility] SERVICES [address] [place], [place], [address] Phone: [phone]   Fax:  [phone]  Physical Therapy Treatment  Patient Details  Name: Verda Mehta MRN: [id number] Date of Birth: October 24, 1940 Referring Provider (PT): Cristopher Peru    Encounter Date: 10/22/2019   PT End of Session - 10/22/19 1058    Visit Number 6    Number of Visits 10    Date for PT Re-Evaluation 11/24/19    PT Start Time 1003    PT Stop Time 1100    PT Time Calculation (min) 57 min           Past Medical History:  Diagnosis Date  . Difficult airway for intubation    Patient told by anesthesiology that she should tell future providers to always use a pediatric ET tube due to narrow airway and prior surgical issues including scarring  . Hypertension   . Thyroid disease     Past Surgical History:  Procedure Laterality Date  . SALIVARY GLAND SURGERY    . THYROID SURGERY      There were no vitals filed for this visit.   Subjective Assessment - 10/22/19 1006    Subjective Pt reported she is not doing the walking band exercise well.    Patient Stated Goals get muscles working better to not have so much of a prolapse                          Pelvic Floor Special Questions - 10/22/19 1034    External Palpation standing, quadriped, bladder outside introitus, slight activation pelvic floor in standing better compared to first visit     Pelvic Floor Internal Exam pt consent verbally without contraindications     Exam Type Vaginal    Palpation supine: folded towels, no cues required on less ab overuse     Strength fair squeeze, definite lift   increased sequential / circumferential recruitment          Tx:  Manual Tx with internal Tx to facilitate upward placement of bladder inside introitus in hooklying position.         Neuro-muscular Re-edu:  Instructed with tactile and verbal cues for endurance training 5 sec, 5 reps with  proper contraction and coordination and proper plantar arch lift in new HEP and cued for proper technique in past HEP ( deep core level 2 and band)                PT Long Term Goals - 09/24/19 1020      PT LONG TERM GOAL #1   Title Pt will demo increased hip abd strength from 3/5 to 4+/5 B in order to improve balance/ pelvic girdle stability/ promote pelvic floor mm balance to minimize prolapse worsening    Time 4    Period Weeks    Status New    Target Date 10/29/19      PT LONG TERM GOAL #2   Title Pt will demo improved pelvic floor coordination and upward lift inside introitus in standing to minimzie lowered position of bladder    Baseline bladder outside introitus in standing    Time 8    Period Weeks    Status New    Target Date 11/12/19      PT LONG TERM GOAL #3   Title Pt will report improved bowel movements daily without straining, Stool Type 4 will 75%, Type 5 25% of  the time    Baseline Stool Type 4 will 50%, Type 5 50% of the time    Time 10    Period Weeks    Status New    Target Date 11/26/19      PT LONG TERM GOAL #4   Title Pt will not need to use her finger to push up the bladder out of the way when she first gets up in the morning in order to practice improved hygiene    Time 10    Period Weeks    Status New    Target Date 12/03/19      PT LONG TERM GOAL #5   Title Pt will be able to report complete emptying of the bladder 75% of the time in order to minimzie risk for UTIs    Time 6    Period Weeks    Status New    Target Date 11/05/19                 Plan - 10/23/19 1045    Clinical Impression Statement Pt progressed to endurance training of pelvic floor contraction. Pt was able to self-correct when overusing her ab with pelvic floor contractions. Progressed to plantar arch strengthening exercises to promote lower kinetic chain strengthening and optimize pelvic floor co-activation. Hip strengthening continues to improve. Pt continues to  benefit from skilled PT.    Stability/Clinical Decision Making Evolving/Moderate complexity    Rehab Potential Good    PT Frequency 1x / week    PT Duration Other (comment)   10   PT Treatment/Interventions Stair training;Functional mobility training;Therapeutic activities;Therapeutic exercise;Balance training;Neuromuscular re-education;Moist Heat;Patient/family education;Manual techniques;Scar mobilization;Cryotherapy;Taping    Consulted and Agree with Plan of Care Patient           Patient will benefit from skilled therapeutic intervention in order to improve the following deficits and impairments:  Decreased mobility, Decreased coordination, Decreased strength, Decreased range of motion, Increased muscle spasms, Improper body mechanics, Postural dysfunction, Difficulty walking, Hypomobility, Decreased safety awareness, Increased fascial restricitons, Decreased scar mobility, Decreased endurance  Visit Diagnosis: Other lack of coordination  Other muscle spasm  Chronic low back pain with left-sided sciatica, unspecified back pain laterality  Other abnormalities of gait and mobility     Problem List Patient Active Problem List   Diagnosis Date Noted  . Allergic reaction 06/09/2017  . DIVERTICULITIS, COLON 02/05/2009  . IBS 02/05/2009    Mariane Masters ,PT, DPT, E-RYT  10/23/2019, 10:46 AM  [place] [facility] MAIN [facility] SERVICES [address] [place], [place], [address] Phone: [phone]   Fax:  [phone]  Name: Shuna Tabor MRN: [id number] Date of Birth: [birth year]

## 2019-10-27 ENCOUNTER — Other Ambulatory Visit: Payer: Self-pay

## 2019-10-27 ENCOUNTER — Ambulatory Visit: Payer: Medicare Other | Admitting: Physical Therapy

## 2019-10-27 DIAGNOSIS — R2689 Other abnormalities of gait and mobility: Secondary | ICD-10-CM

## 2019-10-27 DIAGNOSIS — R278 Other lack of coordination: Secondary | ICD-10-CM

## 2019-10-27 DIAGNOSIS — M5442 Lumbago with sciatica, left side: Secondary | ICD-10-CM

## 2019-10-27 DIAGNOSIS — M62838 Other muscle spasm: Secondary | ICD-10-CM

## 2019-10-27 DIAGNOSIS — G8929 Other chronic pain: Secondary | ICD-10-CM

## 2019-10-27 NOTE — Therapy (Signed)
[place] [facility] MAIN [facility] SERVICES [address] [place], [place], [address] Phone: [phone]   Fax:  [phone]  Physical Therapy Treatment  Patient Details  Name: Kristi Maldonado MRN: [id number] Date of Birth: June 20, 1940 Referring Provider (PT): Cristopher Peru    Encounter Date: 10/27/2019   PT End of Session - 10/27/19 1602    Visit Number 7    Number of Visits 10    Date for PT Re-Evaluation 11/24/19    PT Start Time 1500    PT Stop Time 1602    PT Time Calculation (min) 62 min           Past Medical History:  Diagnosis Date  . Difficult airway for intubation    Patient told by anesthesiology that she should tell future providers to always use a pediatric ET tube due to narrow airway and prior surgical issues including scarring  . Hypertension   . Thyroid disease     Past Surgical History:  Procedure Laterality Date  . SALIVARY GLAND SURGERY    . THYROID SURGERY      There were no vitals filed for this visit.   Subjective Assessment - 10/27/19 1707    Subjective Pt reported she will be leaving for a trip out of town. Pt was doing alot moving stuff and pushing furniture    Patient Stated Goals get muscles working better to not have so much of a prolapse              [facility] PT Assessment - 10/27/19 1658      Observation/Other Assessments   Observations extreme genu valgus with lunge position, poor propioception with psuhing technique                       Pelvic Floor Special Questions - 10/27/19 1659    External Palpation bladder outisde introitus     Pelvic Floor Internal Exam pt consent verbally without contraindications     Exam Type Vaginal    Palpation supine: folded towels    Strength fair squeeze, definite lift   3 secs, 5 reps             [facility] Adult PT Treatment/Exercise - 10/27/19 1654      Therapeutic Activites    Therapeutic Activities Other Therapeutic Activities    Other Therapeutic  Activities consolidated routine for compliance while away on a trip       Neuro Re-ed    Neuro Re-ed Details  excessive cues for knee alignment and weight shift for proper pushing technique , cued for heel raises       Manual Therapy   Manual therapy comments facilitated upward position of bladder and neuromuscular re-edu with pelvic floor                        PT Long Term Goals - 09/24/19 1020      PT LONG TERM GOAL #1   Title Pt will demo increased hip abd strength from 3/5 to 4+/5 B in order to improve balance/ pelvic girdle stability/ promote pelvic floor mm balance to minimize prolapse worsening    Time 4    Period Weeks    Status New    Target Date 10/29/19      PT LONG TERM GOAL #2   Title Pt will demo improved pelvic floor coordination and upward lift inside introitus in standing to minimzie lowered position of bladder  Baseline bladder outside introitus in standing    Time 8    Period Weeks    Status New    Target Date 11/12/19      PT LONG TERM GOAL #3   Title Pt will report improved bowel movements daily without straining, Stool Type 4 will 75%, Type 5 25% of the time    Baseline Stool Type 4 will 50%, Type 5 50% of the time    Time 10    Period Weeks    Status New    Target Date 11/26/19      PT LONG TERM GOAL #4   Title Pt will not need to use her finger to push up the bladder out of the way when she first gets up in the morning in order to practice improved hygiene    Time 10    Period Weeks    Status New    Target Date 12/03/19      PT LONG TERM GOAL #5   Title Pt will be able to report complete emptying of the bladder 75% of the time in order to minimzie risk for UTIs    Time 6    Period Weeks    Status New    Target Date 11/05/19                 Plan - 10/27/19 1654    Clinical Impression Statement Pt required a downgrade of endurance pelvic floor mm from 5 sec to 3 sec but was able to keep 5 reps at this endurance with  proper contraction and coordination. Pt was IND with self-splinting to place bladder inside introitus before performing endurance pelvic floor mm HEP. Consolidated HEP to help pt maintain compliance while she is away from her trip. Plan to focus on lunge and propioception of knee during pushing positions at upcoming sessions to minimize extreme genu valgus and to minimize straining pelvic floor mm. Pt continues to benefit from skilled PT.    Stability/Clinical Decision Making Evolving/Moderate complexity    Rehab Potential Good    PT Frequency 1x / week    PT Duration Other (comment)   10   PT Treatment/Interventions Stair training;Functional mobility training;Therapeutic activities;Therapeutic exercise;Balance training;Neuromuscular re-education;Moist Heat;Patient/family education;Manual techniques;Scar mobilization;Cryotherapy;Taping    Consulted and Agree with Plan of Care Patient           Patient will benefit from skilled therapeutic intervention in order to improve the following deficits and impairments:  Decreased mobility, Decreased coordination, Decreased strength, Decreased range of motion, Increased muscle spasms, Improper body mechanics, Postural dysfunction, Difficulty walking, Hypomobility, Decreased safety awareness, Increased fascial restricitons, Decreased scar mobility, Decreased endurance  Visit Diagnosis: Other lack of coordination  Other muscle spasm  Chronic low back pain with left-sided sciatica, unspecified back pain laterality  Other abnormalities of gait and mobility     Problem List Patient Active Problem List   Diagnosis Date Noted  . Allergic reaction 06/09/2017  . DIVERTICULITIS, COLON 02/05/2009  . IBS 02/05/2009    Mariane Masters ,PT, DPT, E-RYT  10/27/2019, 5:08 PM  [place] [facility] MAIN [facility] SERVICES [address] [place], [place], [address] Phone: [phone]   Fax:  [phone]  Name: Kristi Maldonado MRN:  [id number] Date of Birth: 1941-01-11

## 2019-10-27 NOTE — Patient Instructions (Signed)
°  Morning   Mon Tue Wed Thurs Fri  Sat Sun  3 sec, 5 reps  pelvic floor on pillow          Deep core level 2 ( knee out)            Clam shells              Mon Tue Wed Thurs Fri  Sat Sun  Heel raises   10 reps  X 3    POINT KNEE OUT ALONG 2-3rd toes           Walking with the band              Night   Mon Tue Wed Thurs Fri  Sat Sun  3 sec, 5 reps  pelvic floor on pillow          Deep core level 2 ( knee out)            Clam shells             __  use the gloves and lubricant to place bladder upward before the performing endurance pelvic floor exercise

## 2019-11-05 ENCOUNTER — Encounter: Payer: Medicare Other | Admitting: Physical Therapy

## 2019-11-12 ENCOUNTER — Encounter: Payer: Medicare Other | Admitting: Physical Therapy

## 2019-11-19 ENCOUNTER — Encounter: Payer: Medicare Other | Admitting: Physical Therapy

## 2019-11-25 ENCOUNTER — Encounter: Payer: Medicare Other | Admitting: Physical Therapy

## 2019-11-26 ENCOUNTER — Encounter: Payer: Medicare Other | Admitting: Physical Therapy

## 2019-12-03 ENCOUNTER — Encounter: Payer: Medicare Other | Admitting: Physical Therapy

## 2019-12-09 ENCOUNTER — Ambulatory Visit: Payer: Medicare Other | Attending: Obstetrics and Gynecology | Admitting: Physical Therapy

## 2019-12-09 ENCOUNTER — Other Ambulatory Visit: Payer: Self-pay

## 2019-12-09 DIAGNOSIS — R278 Other lack of coordination: Secondary | ICD-10-CM | POA: Insufficient documentation

## 2019-12-09 DIAGNOSIS — M5442 Lumbago with sciatica, left side: Secondary | ICD-10-CM | POA: Diagnosis present

## 2019-12-09 DIAGNOSIS — M62838 Other muscle spasm: Secondary | ICD-10-CM | POA: Diagnosis present

## 2019-12-09 DIAGNOSIS — G8929 Other chronic pain: Secondary | ICD-10-CM | POA: Insufficient documentation

## 2019-12-09 DIAGNOSIS — R2689 Other abnormalities of gait and mobility: Secondary | ICD-10-CM | POA: Diagnosis present

## 2019-12-09 NOTE — Patient Instructions (Signed)
Arms:  Red band behind back:  wing span ( elbows by side)   5 reps   Then with stepping sideways in minisquat position Return to feet hip width apart  Exhale to spread wings  5 reps each side    __  Bridging with shin perpendicular to ground  Anchor shoulders/ arms/ back of neck Toes spread  10 reps   inhale and exhale lift while pressing down anchor points  ___  Hamstrings on ball Heels down on ball, toes up  20 reps   __   sideplank clams on forearms  ( elbow ahead of shoulder)  Heels press together, inhale, exhale, knees up  10 reps  Each side   Complimentary stretch Figure -4 with towel / belt   3 breaths ,

## 2019-12-09 NOTE — Therapy (Signed)
[place] MAIN [facility] SERVICES [address] [place], [place], [address] Phone: [phone]   Fax:  [phone]  Physical Therapy Treatment/ Progress Note reporting reporting  09/15/19 to 12/09/19 across 10 visits  Patient Details  Name: Kristi Maldonado MRN: [id number] Date of Birth: 05-22-1940 Referring Provider (PT): Pleas Patricia    Encounter Date: 12/09/2019   PT End of Session - 12/09/19 0841    Visit Number 8    Date for PT Re-Evaluation 02/17/20    PT Start Time 0804    PT Stop Time 0900    PT Time Calculation (min) 56 min           Past Medical History:  Diagnosis Date  . Difficult airway for intubation    Patient told by anesthesiology that she should tell future providers to always use a pediatric ET tube due to narrow airway and prior surgical issues including scarring  . Hypertension   . Thyroid disease     Past Surgical History:  Procedure Laterality Date  . SALIVARY GLAND SURGERY    . THYROID SURGERY      There were no vitals filed for this visit.   Subjective Assessment - 12/09/19 0831    Subjective Pt walked 2.5 miles daily  for 2 weeks. Pt did not have leakage . Pt is using a liner now instead of a pad. During the day and sometimes before bed, pt has to use her hand to lift her bladder out of the way.    Patient Stated Goals get muscles working better to not have so much of a prolapse              [facility] PT Assessment - 12/09/19 0855      Other:   Other/ Comments Bridging with no perpendicular force through shin, over active gluts,  sidestepping with narrow BOS       Strength   Overall Strength Comments hip abd 4+/5 B                       Pelvic Floor Special Questions - 12/09/19 0856    External Palpation 3/5 pelvic floor contraction                           PT Long Term Goals - 12/09/19 0850      PT LONG TERM GOAL #1   Title Pt will demo increased hip abd strength from 3/5 to  4+/5 B in order to improve balance/ pelvic girdle stability/ promote pelvic floor mm balance to minimize prolapse worsening    Time 4    Period Weeks    Status Achieved      PT LONG TERM GOAL #2   Title Pt will demo improved pelvic floor coordination and upward lift inside introitus in standing to minimzie lowered position of bladder    Baseline bladder outside introitus in standing    Time 8    Period Weeks    Status Partially Met      PT LONG TERM GOAL #3   Title Pt will report improved bowel movements daily without straining, Stool Type 4 will 75%, Type 5 25% of the time    Baseline Stool Type 4 will 50%, Type 5 50% of the time    Time 10    Period Weeks    Status Achieved      PT LONG TERM GOAL #4   Title  Pt will not need to use her finger to push up the bladder out of the way when she first gets up in the morning in order to practice improved hygiene    Time 10    Period Weeks    Status On-going      PT LONG TERM GOAL #5   Title Pt will be able to report complete emptying of the bladder 75% of the time in order to minimzie risk for UTIs    Time 6    Period Weeks    Status Achieved      Additional Long Term Goals   Additional Long Term Goals Yes      PT LONG TERM GOAL #6   Title Pt will demo IND with fitness execises with proper alignment and co-activation with no strain to pelvic floor    Time 10    Period Weeks    Target Date 02/17/20                 Plan - 12/09/19 0854    Clinical Impression Statement Pt has achieved 50% of her goals and is progressing well towards remaining goals. Pt has transitioned from thick urinary pads to a liner which remains dry. Pt has had no leakage and her bowel movements have become more regular with less straining. Pt is able to completely empty her bladder.  Pt continues to benefit from skilled PT with integration of fitness routine to minimize worsening of prolapse.       Stability/Clinical Decision Making Evolving/Moderate  complexity    Rehab Potential Good    PT Frequency 1x / week    PT Duration Other (comment)   10   PT Treatment/Interventions Stair training;Functional mobility training;Therapeutic activities;Therapeutic exercise;Balance training;Neuromuscular re-education;Moist Heat;Patient/family education;Manual techniques;Scar mobilization;Cryotherapy;Taping    Consulted and Agree with Plan of Care Patient           Patient will benefit from skilled therapeutic intervention in order to improve the following deficits and impairments:  Decreased mobility, Decreased coordination, Decreased strength, Decreased range of motion, Increased muscle spasms, Improper body mechanics, Postural dysfunction, Difficulty walking, Hypomobility, Decreased safety awareness, Increased fascial restricitons, Decreased scar mobility, Decreased endurance  Visit Diagnosis: Other lack of coordination  Other muscle spasm  Chronic low back pain with left-sided sciatica, unspecified back pain laterality  Other abnormalities of gait and mobility     Problem List Patient Active Problem List   Diagnosis Date Noted  . Allergic reaction 06/09/2017  . DIVERTICULITIS, COLON 02/05/2009  . IBS 02/05/2009    Jerl Mina  ,PT, DPT, E-RYT  12/09/2019, 1:56 PM  [place] MAIN [facility] SERVICES [address] [place], [place], [address] Phone: [phone]   Fax:  [phone]  Name: Kristi Maldonado MRN: [id number] Date of Birth: [birth year]

## 2019-12-15 ENCOUNTER — Encounter: Payer: Medicare Other | Admitting: Physical Therapy

## 2019-12-23 ENCOUNTER — Encounter: Payer: Medicare Other | Admitting: Physical Therapy

## 2019-12-24 ENCOUNTER — Ambulatory Visit: Payer: Medicare Other | Admitting: Physical Therapy

## 2019-12-29 ENCOUNTER — Encounter: Payer: Medicare Other | Admitting: Physical Therapy

## 2019-12-31 ENCOUNTER — Ambulatory Visit: Payer: Medicare Other | Admitting: Physical Therapy

## 2020-01-05 ENCOUNTER — Ambulatory Visit: Payer: Medicare Other | Admitting: Physical Therapy

## 2020-01-06 ENCOUNTER — Encounter: Payer: Medicare Other | Admitting: Physical Therapy

## 2020-01-12 ENCOUNTER — Other Ambulatory Visit: Payer: Self-pay

## 2020-01-12 ENCOUNTER — Encounter: Payer: Self-pay | Admitting: Ophthalmology

## 2020-01-12 ENCOUNTER — Ambulatory Visit: Payer: Medicare Other | Attending: Obstetrics and Gynecology | Admitting: Physical Therapy

## 2020-01-12 DIAGNOSIS — G8929 Other chronic pain: Secondary | ICD-10-CM

## 2020-01-12 DIAGNOSIS — R278 Other lack of coordination: Secondary | ICD-10-CM | POA: Insufficient documentation

## 2020-01-12 DIAGNOSIS — R2689 Other abnormalities of gait and mobility: Secondary | ICD-10-CM | POA: Diagnosis present

## 2020-01-12 DIAGNOSIS — M5442 Lumbago with sciatica, left side: Secondary | ICD-10-CM | POA: Diagnosis present

## 2020-01-12 DIAGNOSIS — M62838 Other muscle spasm: Secondary | ICD-10-CM | POA: Insufficient documentation

## 2020-01-12 NOTE — Patient Instructions (Signed)
    Mon Tue Wed Thurs Fri  Sat Sun  3 sec, 10 reps  pelvic floor on           Deep core level 2           ( knee out)              BREKFAST   1 sec holds  ( quick squeeze, full diamond not just the back) 5 reps             LUNCH   1 sec holds  ( quick squeeze, full diamond not just the back) 5 reps              DINNER  1 sec holds  ( quick squeeze, full diamond not just the back) 5 reps              Night   Mon Tue Wed Thurs Fri  Sat Sun  3 sec, 10 reps  pelvic floor on pillow                      WEEKENDS      Fri  Sat Sun  Heel raises   10 reps  X 3    POINT KNEE OUT ALONG 2-3rd toes           Clam shells          Walking with the band  

## 2020-01-12 NOTE — Therapy (Signed)
[place] MAIN [facility] SERVICES [address] [place], [place], [address] Phone: [phone]   Fax:  [phone]  Physical Therapy Treatment  / Discharge Summary   Patient Details  Name: Kristi Maldonado MRN: [id number] Date of Birth: September 14, 1940 Referring Provider (PT): Pleas Patricia    Encounter Date: 01/12/2020   PT End of Session - 01/12/20 1312    Visit Number 9    Date for PT Re-Evaluation 02/17/20    PT Start Time 7169    PT Stop Time 6789    PT Time Calculation (min) 53 min           Past Medical History:  Diagnosis Date  . Degenerative disc disease, lumbar   . Difficult airway for intubation    Patient told by anesthesiology that she should tell future providers to always use a pediatric ET tube due to narrow airway and prior surgical issues including scarring  . Difficult intubation    Narrow airway.  Needs pediatric tube  . Hypertension   . Hypothyroidism   . Thyroid disease   . Vertigo    x1  . Wears dentures    partial lower    Past Surgical History:  Procedure Laterality Date  . SALIVARY GLAND SURGERY    . THYROID SURGERY  2000  . TUBAL LIGATION      There were no vitals filed for this visit.   Subjective Assessment - 01/12/20 1306    Subjective Pt reported she will have cataract surgery for upcoming weeks. She found out she has macular degeneration. Pt is feeling nervous about her surgery.  Pt has felt more bladder lowering the past week but she has not been doing her exercsies as much.    Patient Stated Goals get muscles working better to not have so much of a prolapse              [facility] PT Assessment - 01/12/20 1353      Strength   Overall Strength Comments hip abd 5/5 B                       Pelvic Floor Special Questions - 01/12/20 1353    External Palpation seated 7 reps but required cues for less rectal mm overactivation,  hooklying 3 sec, 10 reps with less cues              [facility] Adult  PT Treatment/Exercise - 01/12/20 1354      Neuro Re-ed    Neuro Re-ed Details  cued for less rectal mm overactivation in seated pelvic floor mm                        PT Long Term Goals - 01/12/20 1308      PT LONG TERM GOAL #1   Title Pt will demo increased hip abd strength from 3/5 to 4+/5 B in order to improve balance/ pelvic girdle stability/ promote pelvic floor mm balance to minimize prolapse worsening    Time 4    Period Weeks    Status Achieved      PT LONG TERM GOAL #2   Title Pt will demo improved pelvic floor coordination and upward lift inside introitus in standing to minimzie lowered position of bladder    Baseline bladder outside introitus in standing    Time 8    Period Weeks    Status Partially Met      PT  LONG TERM GOAL #3   Title Pt will report improved bowel movements daily without straining, Stool Type 4 will 75%, Type 5 25% of the time    Baseline Stool Type 4 will 50%, Type 5 50% of the time    Time 10    Period Weeks    Status Achieved      PT LONG TERM GOAL #4   Title Pt will not need to use her finger to push up the bladder out of the way when she first gets up in the morning in order to practice improved hygiene    Time 10    Period Weeks    Status On-going      PT LONG TERM GOAL #5   Title Pt will be able to report complete emptying of the bladder 75% of the time in order to minimzie risk for UTIs    Time 6    Period Weeks    Status Achieved      PT LONG TERM GOAL #6   Title Pt will demo IND with fitness execises with proper alignment and co-activation with no strain to pelvic floor    Time 10    Period Weeks    Status Achieved                 Plan - 01/12/20 1312    Clinical Impression Statement Pt has achieved 4/6 goals. Pt has been walking 3 miles with no LBP.  Pt has not had to push back up her bladder after laying down at night for at least 5 hours and sometimes more hours when she was doing her exercises. Pt is  having softer stools with compliance to increased water intake. Pt is no longer straining with bowel movement and to completely empty her bladder which is important in minimizing pelvic prolapse. Pt's pelvic floor coordination and strength has improved and thus, she progressed to strengthening exercise in seated position today. Pt required cues to minimize overactivity of posterior pelvic floor mm and abdominal mm. Pt's hip abduction strength also improved. Pt was provided an abbreviated HEP to maintain compliance to HEP. Pt is ready for d/c today.    Stability/Clinical Decision Making Evolving/Moderate complexity    Rehab Potential Good    PT Frequency 1x / week    PT Duration Other (comment)   10   PT Treatment/Interventions Stair training;Functional mobility training;Therapeutic activities;Therapeutic exercise;Balance training;Neuromuscular re-education;Moist Heat;Patient/family education;Manual techniques;Scar mobilization;Cryotherapy;Taping    Consulted and Agree with Plan of Care Patient           Patient will benefit from skilled therapeutic intervention in order to improve the following deficits and impairments:  Decreased mobility, Decreased coordination, Decreased strength, Decreased range of motion, Increased muscle spasms, Improper body mechanics, Postural dysfunction, Difficulty walking, Hypomobility, Decreased safety awareness, Increased fascial restricitons, Decreased scar mobility, Decreased endurance  Visit Diagnosis: Other lack of coordination  Other muscle spasm  Chronic low back pain with left-sided sciatica, unspecified back pain laterality  Other abnormalities of gait and mobility     Problem List Patient Active Problem List   Diagnosis Date Noted  . Allergic reaction 06/09/2017  . DIVERTICULITIS, COLON 02/05/2009  . IBS 02/05/2009    Jerl Mina ,PT, DPT, E-RYT   01/12/2020, 2:39 PM  [place] MAIN [facility]  SERVICES [address] [place], [place], [address] Phone: [phone]   Fax:  [phone]  Name: Kristi Maldonado MRN: [id number] Date of Birth: 1940-12-04

## 2020-01-16 ENCOUNTER — Other Ambulatory Visit: Payer: Self-pay

## 2020-01-16 ENCOUNTER — Other Ambulatory Visit
Admission: RE | Admit: 2020-01-16 | Discharge: 2020-01-16 | Disposition: A | Discharge status: Home / Self Care | Payer: Medicare Other | Source: Ambulatory Visit | Attending: Ophthalmology | Admitting: Ophthalmology

## 2020-01-16 DIAGNOSIS — Z01812 Encounter for preprocedural laboratory examination: Secondary | ICD-10-CM | POA: Insufficient documentation

## 2020-01-16 DIAGNOSIS — Z20822 Contact with and (suspected) exposure to covid-19: Secondary | ICD-10-CM | POA: Insufficient documentation

## 2020-01-17 LAB — SARS CORONAVIRUS 2 (TAT 6-24 HRS): SARS Coronavirus 2: NEGATIVE

## 2020-01-19 ENCOUNTER — Encounter: Payer: Medicare Other | Admitting: Physical Therapy

## 2020-01-19 NOTE — Discharge Instructions (Signed)

## 2020-01-20 ENCOUNTER — Ambulatory Visit: Payer: Medicare Other | Admitting: Anesthesiology

## 2020-01-20 ENCOUNTER — Other Ambulatory Visit: Payer: Self-pay

## 2020-01-20 ENCOUNTER — Encounter: Payer: Self-pay | Admitting: Ophthalmology

## 2020-01-20 ENCOUNTER — Encounter
Admission: RE | Disposition: A | Discharge status: Home / Self Care | Payer: Self-pay | Source: Home / Self Care | Attending: Ophthalmology

## 2020-01-20 ENCOUNTER — Ambulatory Visit
Admission: RE | Admit: 2020-01-20 | Discharge: 2020-01-20 | Disposition: A | Discharge status: Home / Self Care | Payer: Medicare Other | Attending: Ophthalmology | Admitting: Ophthalmology

## 2020-01-20 DIAGNOSIS — H2512 Age-related nuclear cataract, left eye: Secondary | ICD-10-CM | POA: Insufficient documentation

## 2020-01-20 DIAGNOSIS — Z87891 Personal history of nicotine dependence: Secondary | ICD-10-CM | POA: Insufficient documentation

## 2020-01-20 DIAGNOSIS — Z79899 Other long term (current) drug therapy: Secondary | ICD-10-CM | POA: Insufficient documentation

## 2020-01-20 DIAGNOSIS — Z79890 Hormone replacement therapy (postmenopausal): Secondary | ICD-10-CM | POA: Diagnosis not present

## 2020-01-20 HISTORY — DX: Failed or difficult intubation, initial encounter: T88.4XXA

## 2020-01-20 HISTORY — DX: Dizziness and giddiness: R42

## 2020-01-20 HISTORY — DX: Presence of dental prosthetic device (complete) (partial): Z97.2

## 2020-01-20 HISTORY — DX: Other intervertebral disc degeneration, lumbar region without mention of lumbar back pain or lower extremity pain: M51.369

## 2020-01-20 HISTORY — DX: Other intervertebral disc degeneration, lumbar region: M51.36

## 2020-01-20 HISTORY — PX: CATARACT EXTRACTION W/PHACO: SHX586

## 2020-01-20 HISTORY — DX: Hypothyroidism, unspecified: E03.9

## 2020-01-20 SURGERY — PHACOEMULSIFICATION, CATARACT, WITH IOL INSERTION
Anesthesia: Monitor Anesthesia Care | Site: Eye | Laterality: Left

## 2020-01-20 MED ORDER — LIDOCAINE HCL (PF) 2 % IJ SOLN
INTRAOCULAR | Status: DC | PRN
  Administered 2020-01-20: 2 mL

## 2020-01-20 MED ORDER — ACETAMINOPHEN 10 MG/ML IV SOLN: 1000.0000 mg | Freq: Once | INTRAVENOUS | Status: DC | PRN

## 2020-01-20 MED ORDER — EPINEPHRINE PF 1 MG/ML IJ SOLN
INTRAOCULAR | Status: DC | PRN
  Administered 2020-01-20: 58 mL via OPHTHALMIC

## 2020-01-20 MED ORDER — BRIMONIDINE TARTRATE-TIMOLOL 0.2-0.5 % OP SOLN
OPHTHALMIC | Status: DC | PRN
  Administered 2020-01-20: 1 [drp] via OPHTHALMIC

## 2020-01-20 MED ORDER — FENTANYL CITRATE (PF) 100 MCG/2ML IJ SOLN
INTRAMUSCULAR | Status: DC | PRN
  Administered 2020-01-20: 50 ug via INTRAVENOUS

## 2020-01-20 MED ORDER — MOXIFLOXACIN HCL 0.5 % OP SOLN
OPHTHALMIC | Status: DC | PRN
  Administered 2020-01-20: 0.2 mL via OPHTHALMIC

## 2020-01-20 MED ORDER — MIDAZOLAM HCL 2 MG/2ML IJ SOLN
INTRAMUSCULAR | Status: DC | PRN
  Administered 2020-01-20: 1 mg via INTRAVENOUS

## 2020-01-20 MED ORDER — TETRACAINE HCL 0.5 % OP SOLN
1.0000 [drp] | OPHTHALMIC | Status: DC | PRN
  Administered 2020-01-20 (×3): 1 [drp] via OPHTHALMIC

## 2020-01-20 MED ORDER — NA CHONDROIT SULF-NA HYALURON 40-17 MG/ML IO SOLN
INTRAOCULAR | Status: DC | PRN
  Administered 2020-01-20: 1 mL via INTRAOCULAR

## 2020-01-20 MED ORDER — LACTATED RINGERS IV SOLN: INTRAVENOUS | Status: DC

## 2020-01-20 MED ORDER — ONDANSETRON HCL 4 MG/2ML IJ SOLN: 4.0000 mg | Freq: Once | INTRAMUSCULAR | Status: DC | PRN

## 2020-01-20 MED ORDER — ARMC OPHTHALMIC DILATING DROPS
1.0000 "application " | OPHTHALMIC | Status: DC | PRN
  Administered 2020-01-20 (×3): 1 via OPHTHALMIC

## 2020-01-20 SURGICAL SUPPLY — 19 items
CANNULA ANT/CHMB 27G (MISCELLANEOUS) ×2 IMPLANT
CANNULA ANT/CHMB 27GA (MISCELLANEOUS) ×6 IMPLANT
GLOVE SURG LX 8.0 MICRO (GLOVE) ×2
GLOVE SURG LX STRL 8.0 MICRO (GLOVE) ×1 IMPLANT
GLOVE SURG TRIUMPH 8.0 PF LTX (GLOVE) ×3 IMPLANT
GOWN STRL REUS W/ TWL LRG LVL3 (GOWN DISPOSABLE) ×2 IMPLANT
GOWN STRL REUS W/TWL LRG LVL3 (GOWN DISPOSABLE) ×6
LENS IOL TECNIS EYHANCE 21.0 (Intraocular Lens) ×2 IMPLANT
MARKER SKIN DUAL TIP RULER LAB (MISCELLANEOUS) ×3 IMPLANT
NDL FILTER BLUNT 18X1 1/2 (NEEDLE) ×1 IMPLANT
NEEDLE FILTER BLUNT 18X 1/2SAF (NEEDLE) ×2
NEEDLE FILTER BLUNT 18X1 1/2 (NEEDLE) ×1 IMPLANT
PACK EYE AFTER SURG (MISCELLANEOUS) ×3 IMPLANT
PACK OPTHALMIC (MISCELLANEOUS) ×3 IMPLANT
PACK PORFILIO (MISCELLANEOUS) ×3 IMPLANT
SYR 3ML LL SCALE MARK (SYRINGE) ×3 IMPLANT
SYR TB 1ML LUER SLIP (SYRINGE) ×3 IMPLANT
WATER STERILE IRR 250ML POUR (IV SOLUTION) ×3 IMPLANT
WIPE NON LINTING 3.25X3.25 (MISCELLANEOUS) ×3 IMPLANT

## 2020-01-20 NOTE — Op Note (Signed)
PREOPERATIVE DIAGNOSIS:  Nuclear sclerotic cataract of the left eye.   POSTOPERATIVE DIAGNOSIS:  Nuclear sclerotic cataract of the left eye.   OPERATIVE PROCEDURE:@   SURGEON:  Galen Manila, MD.   ANESTHESIA:  Anesthesiologist: Heniser, Burman Foster, MD CRNA: Michaele Offer, CRNA; Maree Krabbe, CRNA  1.      Managed anesthesia care. 2.     0.28ml of Shugarcaine was instilled following the paracentesis   COMPLICATIONS:  None.   TECHNIQUE:   Stop and chop   DESCRIPTION OF PROCEDURE:  The patient was examined and consented in the preoperative holding area where the aforementioned topical anesthesia was applied to the left eye and then brought back to the Operating Room where the left eye was prepped and draped in the usual sterile ophthalmic fashion and a lid speculum was placed. A paracentesis was created with the side port blade and the anterior chamber was filled with viscoelastic. A near clear corneal incision was performed with the steel keratome. A continuous curvilinear capsulorrhexis was performed with a cystotome followed by the capsulorrhexis forceps. Hydrodissection and hydrodelineation were carried out with BSS on a blunt cannula. The lens was removed in a stop and chop  technique and the remaining cortical material was removed with the irrigation-aspiration handpiece. The capsular bag was inflated with viscoelastic and the Technis [id number] lens was placed in the capsular bag without complication. The remaining viscoelastic was removed from the eye with the irrigation-aspiration handpiece. The wounds were hydrated. The anterior chamber was flushed with BSS and the eye was inflated to physiologic pressure. 0.30ml Vigamox was placed in the anterior chamber. The wounds were found to be water tight. The eye was dressed with Combigan. The patient was given protective glasses to wear throughout the day and a shield with which to sleep tonight. The patient was also given drops with which to begin a  drop regimen today and will follow-up with me in one day. Implant Name Type Inv. Item Serial No. Manufacturer Lot No. LRB No. Used Action  LENS IOL TECNIS EYHANCE 21.0 - [id number] Intraocular Lens LENS IOL TECNIS EYHANCE 21.0 [id number] JOHNSON   Left 1 Implanted    Procedure(s): CATARACT EXTRACTION PHACO AND INTRAOCULAR LENS PLACEMENT (IOC) LEFT 6.48 00:42.2 (Left)  Electronically signed: Galen Manila 01/20/2020 10:54 AM

## 2020-01-20 NOTE — H&P (Signed)
[facility]   Primary Care Physician:  Danella Penton, MD Ophthalmologist: Dr. Druscilla Brownie  Pre-Procedure History & Physical: HPI:  Kristi Maldonado is a 79 y.o. female here for cataract surgery.   Past Medical History:  Diagnosis Date  . Degenerative disc disease, lumbar   . Difficult airway for intubation    Patient told by anesthesiology that she should tell future providers to always use a pediatric ET tube due to narrow airway and prior surgical issues including scarring  . Difficult intubation    Narrow airway.  Needs pediatric tube  . Hypertension   . Hypothyroidism   . Thyroid disease   . Vertigo    x1  . Wears dentures    partial lower    Past Surgical History:  Procedure Laterality Date  . SALIVARY GLAND SURGERY    . THYROID SURGERY  2000  . TUBAL LIGATION      Prior to Admission medications   Medication Sig Start Date End Date Taking? Authorizing Provider  Ascorbic Acid (VITAMIN C PO) Take by mouth.   Yes [provider]  Calcium Citrate-Vitamin D (CALCIUM + D PO) Take by mouth.   Yes [provider]  estradiol (ESTRACE) 0.1 MG/GM vaginal cream Place 1 Applicatorful vaginally at bedtime.   Yes [provider]  levothyroxine (SYNTHROID, LEVOTHROID) 75 MCG tablet Take 75 mcg by mouth daily before breakfast.   Yes [provider]  Multiple Vitamin (MULTIVITAMIN) tablet Take 1 tablet by mouth daily.   Yes [provider]  rosuvastatin (CRESTOR) 5 MG tablet Take 5 mg by mouth daily.   Yes [provider]  VITAMIN E PO Take by mouth.   Yes [provider]  aspirin EC 81 MG tablet Take 81 mg by mouth daily. Swallow whole. Patient not taking: Reported on 01/12/2020    [provider]    Allergies as of 12/22/2019 - Review Complete 03/21/2019  Allergen Reaction Noted  . Lisinopril Anaphylaxis 06/09/2017  . Amlodipine Swelling 09/15/2019  . Penicillins Rash and Other (See Comments)     Family  History  Problem Relation Age of Onset  . Breast cancer Maternal Aunt        two aunts  . Breast cancer Cousin        maternal    Social History   Socioeconomic History  . Marital status: Single    Spouse name: Not on file  . Number of children: Not on file  . Years of education: Not on file  . Highest education level: Not on file  Occupational History  . Not on file  Tobacco Use  . Smoking status: Former Games developer  . Smokeless tobacco: Never Used  . Tobacco comment: smoked some in her early 91s  Vaping Use  . Vaping Use: Never used  Substance and Sexual Activity  . Alcohol use: Yes    Alcohol/week: 3.0 standard drinks    Types: 3 Glasses of wine per week  . Drug use: No  . Sexual activity: Never  Other Topics Concern  . Not on file  Social History Narrative  . Not on file   Social Determinants of Health   Financial Resource Strain:   . Difficulty of Paying Living Expenses: Not on file  Food Insecurity:   . Worried About Programme researcher, broadcasting/film/video in the Last Year: Not on file  . Ran Out of Food in the Last Year: Not on file  Transportation Needs:   . Lack of Transportation (  Medical): Not on file  . Lack of Transportation (Non-Medical): Not on file  Physical Activity:   . Days of Exercise per Week: Not on file  . Minutes of Exercise per Session: Not on file  Stress:   . Feeling of Stress : Not on file  Social Connections:   . Frequency of Communication with Friends and Family: Not on file  . Frequency of Social Gatherings with Friends and Family: Not on file  . Attends Religious Services: Not on file  . Active Member of Clubs or Organizations: Not on file  . Attends Banker Meetings: Not on file  . Marital Status: Not on file  Intimate Partner Violence:   . Fear of Current or Ex-Partner: Not on file  . Emotionally Abused: Not on file  . Physically Abused: Not on file  . Sexually Abused: Not on file    Review of Systems: See HPI, otherwise negative  ROS  Physical Exam: BP (!) 143/65   Pulse 64   Temp (!) 97.5 F (36.4 C) (Temporal)   Ht 5\' 8"  (1.727 m)   Wt 64 kg   SpO2 98%   BMI 21.45 kg/m  General:   Alert,  pleasant and cooperative in NAD Head:  Normocephalic and atraumatic. Respiratory:  Normal work of breathing. Heart:  Regular rate and rhythm.   Impression/Plan: Kristi Maldonado is here for cataract surgery.  Risks, benefits, limitations, and alternatives regarding cataract surgery have been reviewed with the patient.  Questions have been answered.  All parties agreeable.   Towana Badger, MD  01/20/2020, 10:29 AM

## 2020-01-20 NOTE — Anesthesia Preprocedure Evaluation (Signed)
Anesthesia Evaluation  Patient identified by MRN, date of birth, ID band Patient awake    Reviewed: Allergy & Precautions, NPO status , Patient's Chart, lab work & pertinent test results, reviewed documented beta blocker date and time   History of Anesthesia Complications (+) DIFFICULT AIRWAY and history of anesthetic complications  Airway Mallampati: II  TM Distance: >3 FB Neck ROM: Limited    Dental  (+) Partial Lower   Pulmonary former smoker,    breath sounds clear to auscultation       Cardiovascular hypertension, (-) angina(-) DOE  Rhythm:Regular Rate:Normal     Neuro/Psych    GI/Hepatic neg GERD  , Diverticulosis IBS   Endo/Other  Hypothyroidism   Renal/GU      Musculoskeletal  (+) Arthritis ,   Abdominal   Peds  Hematology   Anesthesia Other Findings   Reproductive/Obstetrics                            Anesthesia Physical Anesthesia Plan  ASA: II  Anesthesia Plan: MAC   Post-op Pain Management:    Induction: Intravenous  PONV Risk Score and Plan: 2 and TIVA, Midazolam and Treatment may vary due to age or medical condition  Airway Management Planned: Nasal Cannula  Additional Equipment:   Intra-op Plan:   Post-operative Plan:   Informed Consent: I have reviewed the patients History and Physical, chart, labs and discussed the procedure including the risks, benefits and alternatives for the proposed anesthesia with the patient or authorized representative who has indicated his/her understanding and acceptance.       Plan Discussed with: CRNA and Anesthesiologist  Anesthesia Plan Comments:         Anesthesia Quick Evaluation

## 2020-01-20 NOTE — Anesthesia Procedure Notes (Signed)
Procedure Name: MAC Date/Time: 01/20/2020 10:39 AM Performed by: Silvana Newness, CRNA Pre-anesthesia Checklist: Patient identified, Emergency Drugs available, Suction available, Patient being monitored and Timeout performed Patient Re-evaluated:Patient Re-evaluated prior to induction Oxygen Delivery Method: Nasal cannula Placement Confirmation: positive ETCO2

## 2020-01-20 NOTE — Transfer of Care (Signed)
Immediate Anesthesia Transfer of Care Note  Patient: Kristi Maldonado  Procedure(s) Performed: CATARACT EXTRACTION PHACO AND INTRAOCULAR LENS PLACEMENT (IOC) LEFT 6.48 00:42.2 (Left Eye)  Patient Location: PACU  Anesthesia Type: MAC  Level of Consciousness: awake, alert  and patient cooperative  Airway and Oxygen Therapy: Patient Spontanous Breathing and Patient connected to supplemental oxygen  Post-op Assessment: Post-op Vital signs reviewed, Patient's Cardiovascular Status Stable, Respiratory Function Stable, Patent Airway and No signs of Nausea or vomiting  Post-op Vital Signs: Reviewed and stable  Complications: No complications documented.

## 2020-01-20 NOTE — Anesthesia Postprocedure Evaluation (Signed)
Anesthesia Post Note  Patient: Kristi Maldonado  Procedure(s) Performed: CATARACT EXTRACTION PHACO AND INTRAOCULAR LENS PLACEMENT (IOC) LEFT 6.48 00:42.2 (Left Eye)     Patient location during evaluation: PACU Anesthesia Type: MAC Level of consciousness: awake and alert Pain management: pain level controlled Vital Signs Assessment: post-procedure vital signs reviewed and stable Respiratory status: spontaneous breathing, nonlabored ventilation, respiratory function stable and patient connected to nasal cannula oxygen Cardiovascular status: stable and blood pressure returned to baseline Postop Assessment: no apparent nausea or vomiting Anesthetic complications: no   No complications documented.  Cesar Rogerson A  Mikena Masoner

## 2020-01-21 ENCOUNTER — Encounter: Payer: Self-pay | Admitting: Ophthalmology

## 2020-02-01 ENCOUNTER — Other Ambulatory Visit: Payer: Self-pay

## 2020-02-01 ENCOUNTER — Emergency Department
Admission: EM | Admit: 2020-02-01 | Discharge: 2020-02-01 | Disposition: A | Discharge status: Home / Self Care | Payer: Medicare Other | Attending: Emergency Medicine | Admitting: Emergency Medicine

## 2020-02-01 ENCOUNTER — Emergency Department: Payer: Medicare Other

## 2020-02-01 DIAGNOSIS — Z79899 Other long term (current) drug therapy: Secondary | ICD-10-CM | POA: Insufficient documentation

## 2020-02-01 DIAGNOSIS — I10 Essential (primary) hypertension: Secondary | ICD-10-CM | POA: Diagnosis not present

## 2020-02-01 DIAGNOSIS — R002 Palpitations: Secondary | ICD-10-CM

## 2020-02-01 DIAGNOSIS — R0789 Other chest pain: Secondary | ICD-10-CM | POA: Diagnosis not present

## 2020-02-01 DIAGNOSIS — E039 Hypothyroidism, unspecified: Secondary | ICD-10-CM | POA: Diagnosis not present

## 2020-02-01 DIAGNOSIS — Z87891 Personal history of nicotine dependence: Secondary | ICD-10-CM | POA: Diagnosis not present

## 2020-02-01 DIAGNOSIS — Z7982 Long term (current) use of aspirin: Secondary | ICD-10-CM | POA: Insufficient documentation

## 2020-02-01 LAB — CBC WITH DIFFERENTIAL/PLATELET
Abs Immature Granulocytes: 0.06 10*3/uL (ref 0.00–0.07)
Basophils Absolute: 0.1 10*3/uL (ref 0.0–0.1)
Basophils Relative: 1 %
Eosinophils Absolute: 0.2 10*3/uL (ref 0.0–0.5)
Eosinophils Relative: 2 %
HCT: 35.3 % — ABNORMAL LOW (ref 36.0–46.0)
Hemoglobin: 12 g/dL (ref 12.0–15.0)
Immature Granulocytes: 1 %
Lymphocytes Relative: 14 %
Lymphs Abs: 1.4 10*3/uL (ref 0.7–4.0)
MCH: 32.2 pg (ref 26.0–34.0)
MCHC: 34 g/dL (ref 30.0–36.0)
MCV: 94.6 fL (ref 80.0–100.0)
Monocytes Absolute: 0.5 10*3/uL (ref 0.1–1.0)
Monocytes Relative: 5 %
Neutro Abs: 7.3 10*3/uL (ref 1.7–7.7)
Neutrophils Relative %: 77 %
Platelets: 234 10*3/uL (ref 150–400)
RBC: 3.73 MIL/uL — ABNORMAL LOW (ref 3.87–5.11)
RDW: 12.7 % (ref 11.5–15.5)
WBC: 9.5 10*3/uL (ref 4.0–10.5)
nRBC: 0 % (ref 0.0–0.2)

## 2020-02-01 LAB — BASIC METABOLIC PANEL
Anion gap: 10 (ref 5–15)
BUN: 17 mg/dL (ref 8–23)
CO2: 26 mmol/L (ref 22–32)
Calcium: 9 mg/dL (ref 8.9–10.3)
Chloride: 103 mmol/L (ref 98–111)
Creatinine, Ser: 1.29 mg/dL — ABNORMAL HIGH (ref 0.44–1.00)
GFR, Estimated: 42 mL/min — ABNORMAL LOW (ref >60)
Glucose, Bld: 117 mg/dL — ABNORMAL HIGH (ref 70–99)
Potassium: 3.6 mmol/L (ref 3.5–5.1)
Sodium: 139 mmol/L (ref 135–145)

## 2020-02-01 LAB — TROPONIN I (HIGH SENSITIVITY)
Troponin I (High Sensitivity): 7 ng/L (ref <18)
Troponin I (High Sensitivity): 7 ng/L (ref <18)

## 2020-02-01 LAB — MAGNESIUM: Magnesium: 2.1 mg/dL (ref 1.7–2.4)

## 2020-02-01 MED ORDER — ACETAMINOPHEN 500 MG PO TABS
1000.0000 mg | ORAL_TABLET | Freq: Once | ORAL | Status: AC
  Administered 2020-02-01: 1000 mg via ORAL
  Filled 2020-02-01: qty 2

## 2020-02-01 NOTE — ED Notes (Signed)
Pt to xray

## 2020-02-01 NOTE — ED Triage Notes (Addendum)
Pt to ED via [facility] for palpitations and chest pressure rated at 8/10 that started today around 0630. Per EMS pt was hypertensive, BP 212/102, CBG 160, SPO2 waws 95% and other VS were normal. Pt was given 0.3 mg NTG SL one time, 2 inches NTG paste on chest, and 324mg  ASA. 12 lead EKG with EMS was unremarkable. EDP at bedside. Pt alert and oriented. Denies SOB and chest pain is now 0/10.  Pt has hx HTN and mitral valve prolapse.

## 2020-02-01 NOTE — ED Provider Notes (Signed)
[facility] Emergency Department Provider Note   ____________________________________________   First MD Initiated Contact with Patient 02/01/20 [id number]     (approximate)  I have reviewed the triage vital signs and the nursing notes.   HISTORY  Chief Complaint Palpitations and Chest Pain    HPI Kristi Maldonado is a 79 y.o. female with past medical history of hypertension, hypothyroidism, and mitral valve prolapse who presents to the ED complaining of palpitations and chest pain.  Patient reports that she was feeling fatigued when she went to bed last night.  Shortly after waking up this morning, she felt like her heart was racing with a tightness in her chest.  She describes some mild associated difficulty breathing and she noticed her blood pressure was very high at home.  EMS was called and patient's initial blood pressure was 212/102.  She was given 1 sublingual nitro and 2 inches of nitroglycerin paste was placed on her chest.  She states that chest tightness has since resolved and she is no longer having palpitations.  She denies any recent fevers, cough, pain or swelling in her legs, abdominal pain, vomiting, diarrhea, dysuria, or hematuria.  She denies any cardiac history beyond mitral prolapse, has complained of palpitations in the past but with no clear diagnosis.        Past Medical History:  Diagnosis Date  . Degenerative disc disease, lumbar   . Difficult airway for intubation    Patient told by anesthesiology that she should tell future providers to always use a pediatric ET tube due to narrow airway and prior surgical issues including scarring  . Difficult intubation    Narrow airway.  Needs pediatric tube  . Hypertension   . Hypothyroidism   . Thyroid disease   . Vertigo    x1  . Wears dentures    partial lower    Patient Active Problem List   Diagnosis Date Noted  . Allergic reaction 06/09/2017  . DIVERTICULITIS, COLON 02/05/2009  . IBS  02/05/2009    Past Surgical History:  Procedure Laterality Date  . CATARACT EXTRACTION W/PHACO Left 01/20/2020   Procedure: CATARACT EXTRACTION PHACO AND INTRAOCULAR LENS PLACEMENT (IOC) LEFT 6.48 00:42.2;  Surgeon: Galen Manila, MD;  Location: [facility];  Service: Ophthalmology;  Laterality: Left;  . SALIVARY GLAND SURGERY    . THYROID SURGERY  2000  . TUBAL LIGATION      Prior to Admission medications   Medication Sig Start Date End Date Taking? Authorizing Provider  Ascorbic Acid (VITAMIN C PO) Take by mouth.    [provider]  aspirin EC 81 MG tablet Take 81 mg by mouth daily. Swallow whole. Patient not taking: Reported on 01/12/2020    [provider]  Calcium Citrate-Vitamin D (CALCIUM + D PO) Take by mouth.    [provider]  estradiol (ESTRACE) 0.1 MG/GM vaginal cream Place 1 Applicatorful vaginally at bedtime.    [provider]  levothyroxine (SYNTHROID, LEVOTHROID) 75 MCG tablet Take 75 mcg by mouth daily before breakfast.    [provider]  Multiple Vitamin (MULTIVITAMIN) tablet Take 1 tablet by mouth daily.    [provider]  rosuvastatin (CRESTOR) 5 MG tablet Take 5 mg by mouth daily.    [provider]  VITAMIN E PO Take by mouth.    [provider]    Allergies Lisinopril, Amlodipine, Contrast media [iodinated diagnostic agents], Codeine, and Penicillins  Family History  Problem Relation Age of Onset  .  Breast cancer Maternal Aunt        two aunts  . Breast cancer Cousin        maternal    Social History Social History   Tobacco Use  . Smoking status: Former Games developer  . Smokeless tobacco: Never Used  . Tobacco comment: smoked some in her early 29s  Vaping Use  . Vaping Use: Never used  Substance Use Topics  . Alcohol use: Yes    Alcohol/week: 3.0 standard drinks    Types: 3 Glasses of wine per week  . Drug use: No    Review of Systems  Constitutional: No  fever/chills Eyes: No visual changes. ENT: No sore throat. Cardiovascular: Positive for palpitations and chest pain. Respiratory: Positive for shortness of breath. Gastrointestinal: No abdominal pain.  No nausea, no vomiting.  No diarrhea.  No constipation. Genitourinary: Negative for dysuria. Musculoskeletal: Negative for back pain. Skin: Negative for rash. Neurological: Negative for headaches, focal weakness or numbness.  ____________________________________________   PHYSICAL EXAM:  VITAL SIGNS: ED Triage Vitals  Enc Vitals Group     BP 02/01/20 0954 (!) 168/66     Pulse Rate 02/01/20 0954 (!) 56     Resp 02/01/20 0954 15     Temp --      Temp src --      SpO2 02/01/20 0953 100 %     Weight 02/01/20 0954 140 lb (63.5 kg)     Height 02/01/20 0954 5\' 8"  (1.727 m)     Head Circumference --      Peak Flow --      Pain Score 02/01/20 0954 0     Pain Loc --      Pain Edu? --      Excl. in GC? --     Constitutional: Alert and oriented. Eyes: Conjunctivae are normal. Head: Atraumatic. Nose: No congestion/rhinnorhea. Mouth/Throat: Mucous membranes are moist. Neck: Normal ROM Cardiovascular: Normal rate, regular rhythm. Grossly normal heart sounds.  2+ radial pulses bilaterally. Respiratory: Normal respiratory effort.  No retractions. Lungs CTAB. Gastrointestinal: Soft and nontender. No distention. Genitourinary: deferred Musculoskeletal: No lower extremity tenderness nor edema. Neurologic:  Normal speech and language. No gross focal neurologic deficits are appreciated. Skin:  Skin is warm, dry and intact. No rash noted. Psychiatric: Mood and affect are normal. Speech and behavior are normal.  ____________________________________________   LABS (all labs ordered are listed, but only abnormal results are displayed)  Labs Reviewed  CBC WITH DIFFERENTIAL/PLATELET - Abnormal; Notable for the following components:      Result Value   RBC 3.73 (*)    HCT 35.3 (*)    All  other components within normal limits  BASIC METABOLIC PANEL - Abnormal; Notable for the following components:   Glucose, Bld 117 (*)    Creatinine, Ser 1.29 (*)    GFR, Estimated 42 (*)    All other components within normal limits  MAGNESIUM  TROPONIN I (HIGH SENSITIVITY)  TROPONIN I (HIGH SENSITIVITY)   ____________________________________________  EKG  ED ECG REPORT I, 02/03/20, the attending physician, personally viewed and interpreted this ECG.   Date: 02/01/2020  EKG Time: 9:51  Rate: 61  Rhythm: normal sinus rhythm  Axis: Normal  Intervals:none  ST&T Change: None   PROCEDURES  Procedure(s) performed (including Critical Care):  .1-3 Lead EKG Interpretation Performed by: 02/03/2020, MD Authorized by: Chesley Noon, MD     Interpretation: normal     ECG rate:  62   ECG  rate assessment: normal     Rhythm: sinus rhythm     Ectopy: none     Conduction: normal       ____________________________________________   INITIAL IMPRESSION / ASSESSMENT AND PLAN / ED COURSE       79 year old female with past medical history of hypertension, hypothyroidism, and mitral valve prolapse who presents to the ED complaining of palpitations and chest tightness starting earlier this morning.  She was given sublingual nitro and Nitropaste by EMS, all symptoms have since resolved.  Patient is in normal sinus rhythm on cardiac monitor and EKG shows no ischemic changes.  We will observe patient on cardiac monitor, check 2 sets of troponin, electrolytes, and chest x-ray.  Chest x-ray reviewed by me and shows no infiltrate, edema, or effusion.  Patient remains in normal sinus rhythm on cardiac monitor and 2 sets of troponin are negative, very low suspicion for ACS.  She remains chest pain-free here in the ED and given unremarkable work-up is appropriate for discharge home with cardiology follow-up.  She was counseled to return to the ED for new worsening symptoms, patient  agrees with plan.      ____________________________________________   FINAL CLINICAL IMPRESSION(S) / ED DIAGNOSES  Final diagnoses:  Atypical chest pain  Palpitations     ED Discharge Orders    None       Note:  This document was prepared using Dragon voice recognition software and may include unintentional dictation errors.   Chesley Noon, MD 02/01/20 1306

## 2020-02-12 ENCOUNTER — Ambulatory Visit: Admit: 2020-02-12 | Payer: Medicare Other | Admitting: Ophthalmology

## 2020-02-12 SURGERY — PHACOEMULSIFICATION, CATARACT, WITH IOL INSERTION
Anesthesia: Topical | Laterality: Right

## 2020-02-22 ENCOUNTER — Emergency Department
Admission: EM | Admit: 2020-02-22 | Discharge: 2020-02-22 | Disposition: A | Discharge status: Home / Self Care | Payer: Medicare Other | Attending: Emergency Medicine | Admitting: Emergency Medicine

## 2020-02-22 ENCOUNTER — Other Ambulatory Visit: Payer: Self-pay

## 2020-02-22 ENCOUNTER — Encounter: Payer: Self-pay | Admitting: Emergency Medicine

## 2020-02-22 DIAGNOSIS — R42 Dizziness and giddiness: Secondary | ICD-10-CM | POA: Insufficient documentation

## 2020-02-22 DIAGNOSIS — E039 Hypothyroidism, unspecified: Secondary | ICD-10-CM | POA: Insufficient documentation

## 2020-02-22 DIAGNOSIS — R002 Palpitations: Secondary | ICD-10-CM | POA: Diagnosis present

## 2020-02-22 DIAGNOSIS — I10 Essential (primary) hypertension: Secondary | ICD-10-CM | POA: Insufficient documentation

## 2020-02-22 DIAGNOSIS — Z79899 Other long term (current) drug therapy: Secondary | ICD-10-CM | POA: Insufficient documentation

## 2020-02-22 DIAGNOSIS — Z87891 Personal history of nicotine dependence: Secondary | ICD-10-CM | POA: Insufficient documentation

## 2020-02-22 DIAGNOSIS — R0602 Shortness of breath: Secondary | ICD-10-CM | POA: Diagnosis not present

## 2020-02-22 DIAGNOSIS — Z7982 Long term (current) use of aspirin: Secondary | ICD-10-CM | POA: Insufficient documentation

## 2020-02-22 LAB — BASIC METABOLIC PANEL
Anion gap: 9 (ref 5–15)
BUN: 15 mg/dL (ref 8–23)
CO2: 26 mmol/L (ref 22–32)
Calcium: 9.4 mg/dL (ref 8.9–10.3)
Chloride: 101 mmol/L (ref 98–111)
Creatinine, Ser: 1.19 mg/dL — ABNORMAL HIGH (ref 0.44–1.00)
GFR, Estimated: 46 mL/min — ABNORMAL LOW (ref >60)
Glucose, Bld: 105 mg/dL — ABNORMAL HIGH (ref 70–99)
Potassium: 3.5 mmol/L (ref 3.5–5.1)
Sodium: 136 mmol/L (ref 135–145)

## 2020-02-22 LAB — CBC
HCT: 34.2 % — ABNORMAL LOW (ref 36.0–46.0)
Hemoglobin: 11.6 g/dL — ABNORMAL LOW (ref 12.0–15.0)
MCH: 31.7 pg (ref 26.0–34.0)
MCHC: 33.9 g/dL (ref 30.0–36.0)
MCV: 93.4 fL (ref 80.0–100.0)
Platelets: 237 10*3/uL (ref 150–400)
RBC: 3.66 MIL/uL — ABNORMAL LOW (ref 3.87–5.11)
RDW: 12.5 % (ref 11.5–15.5)
WBC: 7.8 10*3/uL (ref 4.0–10.5)
nRBC: 0 % (ref 0.0–0.2)

## 2020-02-22 MED ORDER — ALPRAZOLAM 0.5 MG PO TABS: 0.5000 mg | ORAL_TABLET | Freq: Three times a day (TID) | ORAL | 0 refills | Status: AC | PRN

## 2020-02-22 NOTE — ED Notes (Signed)
Pt presents to ED with c/o of palpiatations that started this morning. Pt cannot tell this RN if palpitations woke her up or if she woke up she already had them. Pt states an episode similar to this but less intense happened about 2 weeks ago and f/up with PCP who gave her xanax for anxiety. Pt denies a HX of anxiety. Pt states xanax does help but pt states lingering weakness after these episodes that last about 2 hours after palpations start. Pt denies pain, SOB, or hyperventilating during these episodes. Pt is A&Ox4. NAD noted.

## 2020-02-22 NOTE — ED Notes (Signed)
D/C and education of halter discussed with pt, pt verbalized understanding. NAD noted. VSS.

## 2020-02-22 NOTE — ED Triage Notes (Signed)
Pt reports this am has some palpitations and some dizziness and SOB. Pt reports was seen her MD for the same and dx'd with anxiety. Pt reports her MD did give her xanax and she took one and it helped.

## 2020-02-22 NOTE — ED Provider Notes (Signed)
[facility] Emergency Department Provider Note  Time seen: 3:57 PM  I have reviewed the triage vital signs and the nursing notes.   HISTORY  Chief Complaint Dizziness, Irregular Heart Beat, and Shortness of Breath   HPI Kristi Maldonado is a 79 y.o. female with a past medical history of hypertension, presents to the emergency department for episodes of palpitations.  According to the patient for the past 3 weeks or so she has been experiencing intermittent episodes of palpitations which she feels like her heart is racing, she admits that this scares her which seems to make her symptoms worse.  Denies any chest pain.  Does state mild shortness of breath when she feels like her heart is racing.  Denies any shortness of breath currently.  Denies any abdominal pain nausea vomiting or diarrhea.  Was seen 2 weeks ago for the same and prescribed Xanax for possible anxiety.  Patient states she took it this morning and it did help somewhat.   Past Medical History:  Diagnosis Date  . Degenerative disc disease, lumbar   . Difficult airway for intubation    Patient told by anesthesiology that she should tell future providers to always use a pediatric ET tube due to narrow airway and prior surgical issues including scarring  . Difficult intubation    Narrow airway.  Needs pediatric tube  . Hypertension   . Hypothyroidism   . Thyroid disease   . Vertigo    x1  . Wears dentures    partial lower    Patient Active Problem List   Diagnosis Date Noted  . Allergic reaction 06/09/2017  . DIVERTICULITIS, COLON 02/05/2009  . IBS 02/05/2009    Past Surgical History:  Procedure Laterality Date  . CATARACT EXTRACTION W/PHACO Left 01/20/2020   Procedure: CATARACT EXTRACTION PHACO AND INTRAOCULAR LENS PLACEMENT (IOC) LEFT 6.48 00:42.2;  Surgeon: Galen Manila, MD;  Location: [facility];  Service: Ophthalmology;  Laterality: Left;  . SALIVARY GLAND SURGERY    . THYROID  SURGERY  2000  . TUBAL LIGATION      Prior to Admission medications   Medication Sig Start Date End Date Taking? Authorizing Provider  Ascorbic Acid (VITAMIN C PO) Take by mouth.    [provider]  aspirin EC 81 MG tablet Take 81 mg by mouth daily. Swallow whole. Patient not taking: Reported on 01/12/2020    [provider]  Calcium Citrate-Vitamin D (CALCIUM + D PO) Take by mouth.    [provider]  estradiol (ESTRACE) 0.1 MG/GM vaginal cream Place 1 Applicatorful vaginally at bedtime.    [provider]  levothyroxine (SYNTHROID, LEVOTHROID) 75 MCG tablet Take 75 mcg by mouth daily before breakfast.    [provider]  Multiple Vitamin (MULTIVITAMIN) tablet Take 1 tablet by mouth daily.    [provider]  rosuvastatin (CRESTOR) 5 MG tablet Take 5 mg by mouth daily.    [provider]  VITAMIN E PO Take by mouth.    [provider]    Allergies  Allergen Reactions  . Lisinopril Anaphylaxis  . Amlodipine Swelling    Tongue swelling   . Contrast Media [Iodinated Diagnostic Agents] Hives and Nausea Only  . Codeine Rash  . Penicillins Rash and Other (See Comments)    Has patient had a PCN reaction causing immediate rash, facial/tongue/throat swelling, SOB or lightheadedness with hypotension: Unknown Has patient had a PCN reaction causing severe rash involving mucus membranes or skin necrosis: No  Has patient had a PCN reaction that required hospitalization: No Has patient had a PCN reaction occurring within the last 10 years: No If all of the above answers are "NO", then may proceed with Cephalosporin use.     Family History  Problem Relation Age of Onset  . Breast cancer Maternal Aunt        two aunts  . Breast cancer Cousin        maternal    Social History Social History   Tobacco Use  . Smoking status: Former Games developer  . Smokeless tobacco: Never Used  . Tobacco comment: smoked some in her early 32s   Vaping Use  . Vaping Use: Never used  Substance Use Topics  . Alcohol use: Yes    Alcohol/week: 3.0 standard drinks    Types: 3 Glasses of wine per week  . Drug use: No    Review of Systems Constitutional: Negative for fever. Cardiovascular: Negative for chest pain.  Positive for palpitations. Respiratory: Negative for shortness of breath. Gastrointestinal: Negative for abdominal pain Musculoskeletal: Negative for musculoskeletal complaints Neurological: Negative for headache All other ROS negative  ____________________________________________   PHYSICAL EXAM:  VITAL SIGNS: ED Triage Vitals  Enc Vitals Group     BP 02/22/20 1053 129/71     Pulse Rate 02/22/20 1053 65     Resp 02/22/20 1053 20     Temp 02/22/20 1053 97.7 F (36.5 C)     Temp Source 02/22/20 1053 Oral     SpO2 02/22/20 1053 96 %     Weight 02/22/20 1051 142 lb (64.4 kg)     Height 02/22/20 1051 5\' 8"  (1.727 m)     Head Circumference --      Peak Flow --      Pain Score 02/22/20 1050 0     Pain Loc --      Pain Edu? --      Excl. in GC? --    Constitutional: Alert and oriented. Well appearing and in no distress. Eyes: Normal exam ENT      Head: Normocephalic and atraumatic.      Mouth/Throat: Mucous membranes are moist. Cardiovascular: Normal rate, regular rhythm.  Respiratory: Normal respiratory effort without tachypnea nor retractions. Breath sounds are clear  Gastrointestinal: Soft and nontender. No distention.   Musculoskeletal: Nontender with normal range of motion in all extremities. Neurologic:  Normal speech and language. No gross focal neurologic deficits Skin:  Skin is warm, dry and intact.  Psychiatric: Mood and affect are normal.   ____________________________________________    EKG  EKG viewed and interpreted by myself shows a normal sinus rhythm at 63 bpm with a narrow QRS, normal axis, normal intervals, no concerning ST  changes.  ____________________________________________   INITIAL IMPRESSION / ASSESSMENT AND PLAN / ED COURSE  Pertinent labs & imaging results that were available during my care of the patient were reviewed by me and considered in my medical decision making (see chart for details).   Patient presents emergency department for palpitations.  States they have been intermittent but ongoing over the past 3 weeks or so.  Patient was seen on the 28th for the same with a normal work-up including negative cardiac enzymes.  Patient was seen on the 23rd prior to that with normal labs including normal TSH.  Patient states symptoms appear to improve somewhat after taking the Xanax but did not completely go away.  Patient is prescribed a very small dose 0.25 mg tablets.  I  discussed with the patient trial of 0.5 mg to be used if needed.  I also discussed follow-up with Dr. Lady Gary with him she was seen 2 years ago, to discuss a Holter monitor.  Patient agreeable to plan of care.  Overall patient appears well with reassuring vitals and reassuring physical exam.  I believe the patient safe for discharge home with cardiology follow-up.  Kristi Maldonado was evaluated in Emergency Department on 02/22/2020 for the symptoms described in the history of present illness. She was evaluated in the context of the global COVID-19 pandemic, which necessitated consideration that the patient might be at risk for infection with the SARS-CoV-2 virus that causes COVID-19. Institutional protocols and algorithms that pertain to the evaluation of patients at risk for COVID-19 are in a state of rapid change based on information released by regulatory bodies including the CDC and federal and state organizations. These policies and algorithms were followed during the patient's care in the ED.  ____________________________________________   FINAL CLINICAL IMPRESSION(S) / ED DIAGNOSES  Palpitations   Minna Antis, MD 02/22/20 1601

## 2020-03-02 ENCOUNTER — Other Ambulatory Visit: Payer: Self-pay

## 2020-03-02 ENCOUNTER — Encounter: Payer: Self-pay | Admitting: Ophthalmology

## 2020-03-02 ENCOUNTER — Other Ambulatory Visit: Payer: Self-pay | Admitting: Cardiology

## 2020-03-02 DIAGNOSIS — R0602 Shortness of breath: Secondary | ICD-10-CM

## 2020-03-04 ENCOUNTER — Ambulatory Visit
Admission: RE | Admit: 2020-03-04 | Discharge: 2020-03-04 | Disposition: A | Discharge status: Home / Self Care | Payer: Medicare Other | Source: Ambulatory Visit | Attending: Cardiology | Admitting: Cardiology

## 2020-03-04 ENCOUNTER — Other Ambulatory Visit: Payer: Self-pay

## 2020-03-04 DIAGNOSIS — R0602 Shortness of breath: Secondary | ICD-10-CM | POA: Diagnosis not present

## 2020-03-04 LAB — ECHOCARDIOGRAM COMPLETE
AR max vel: 2.25 cm2
AV Area VTI: 1.8 cm2
AV Area mean vel: 2.14 cm2
AV Mean grad: 5 mmHg
AV Peak grad: 8.8 mmHg
Ao pk vel: 1.48 m/s
Area-P 1/2: 2.33 cm2
S' Lateral: 2.51 cm

## 2020-03-04 NOTE — Progress Notes (Signed)
*  PRELIMINARY RESULTS* Echocardiogram 2D Echocardiogram has been performed.  Kristi Maldonado Passage 03/04/2020, 10:30 AM

## 2020-03-06 HISTORY — PX: OTHER SURGICAL HISTORY: SHX169

## 2020-03-12 ENCOUNTER — Other Ambulatory Visit: Payer: Self-pay

## 2020-03-12 ENCOUNTER — Other Ambulatory Visit
Admission: RE | Admit: 2020-03-12 | Discharge: 2020-03-12 | Disposition: A | Discharge status: Home / Self Care | Payer: Medicare Other | Source: Ambulatory Visit | Attending: Ophthalmology | Admitting: Ophthalmology

## 2020-03-12 DIAGNOSIS — Z20822 Contact with and (suspected) exposure to covid-19: Secondary | ICD-10-CM | POA: Diagnosis not present

## 2020-03-12 DIAGNOSIS — Z01812 Encounter for preprocedural laboratory examination: Secondary | ICD-10-CM | POA: Diagnosis present

## 2020-03-12 LAB — SARS CORONAVIRUS 2 (TAT 6-24 HRS): SARS Coronavirus 2: NEGATIVE

## 2020-03-12 NOTE — Discharge Instructions (Signed)
General Anesthesia, Adult, Care After This sheet gives you information about how to care for yourself after your procedure. Your health care provider may also give you more specific instructions. If you have problems or questions, contact your health care provider. What can I expect after the procedure? After the procedure, the following side effects are common:  Pain or discomfort at the IV site.  Nausea.  Vomiting.  Sore throat.  Trouble concentrating.  Feeling cold or chills.  Weak or tired.  Sleepiness and fatigue.  Soreness and body aches. These side effects can affect parts of the body that were not involved in surgery. Follow these instructions at home:  For at least 24 hours after the procedure:  Have a responsible adult stay with you. It is important to have someone help care for you until you are awake and alert.  Rest as needed.  Do not: ? Participate in activities in which you could fall or become injured. ? Drive. ? Use heavy machinery. ? Drink alcohol. ? Take sleeping pills or medicines that cause drowsiness. ? Make important decisions or sign legal documents. ? Take care of children on your own. Eating and drinking  Follow any instructions from your health care provider about eating or drinking restrictions.  When you feel hungry, start by eating small amounts of foods that are soft and easy to digest (bland), such as toast. Gradually return to your regular diet.  Drink enough fluid to keep your urine pale yellow.  If you vomit, rehydrate by drinking water, juice, or clear broth. General instructions  If you have sleep apnea, surgery and certain medicines can increase your risk for breathing problems. Follow instructions from your health care provider about wearing your sleep device: ? Anytime you are sleeping, including during daytime naps. ? While taking prescription pain medicines, sleeping medicines, or medicines that make you drowsy.  Return to  your normal activities as told by your health care provider. Ask your health care provider what activities are safe for you.  Take over-the-counter and prescription medicines only as told by your health care provider.  If you smoke, do not smoke without supervision.  Keep all follow-up visits as told by your health care provider. This is important. Contact a health care provider if:  You have nausea or vomiting that does not get better with medicine.  You cannot eat or drink without vomiting.  You have pain that does not get better with medicine.  You are unable to pass urine.  You develop a skin rash.  You have a fever.  You have redness around your IV site that gets worse. Get help right away if:  You have difficulty breathing.  You have chest pain.  You have blood in your urine or stool, or you vomit blood. Summary  After the procedure, it is common to have a sore throat or nausea. It is also common to feel tired.  Have a responsible adult stay with you for the first 24 hours after general anesthesia. It is important to have someone help care for you until you are awake and alert.  When you feel hungry, start by eating small amounts of foods that are soft and easy to digest (bland), such as toast. Gradually return to your regular diet.  Drink enough fluid to keep your urine pale yellow.  Return to your normal activities as told by your health care provider. Ask your health care provider what activities are safe for you. This information is not   intended to replace advice given to you by your health care provider. Make sure you discuss any questions you have with your health care provider. Document Revised: 02/23/2017 Document Reviewed: 10/06/2016 Elsevier Patient Education  2020 Else[employer] Inc[employer]  Cataract Surgery, Care After This sheet gives you information about how to care for yourself after your procedure. Your health care provider may also give you more specific  instructions. If you have problems or questions, contact your health care provider. What can I expect after the procedure? After the procedure, it is common to have:  Itching.  Discomfort.  Fluid discharge.  Sensitivity to light and to touch.  Bruising in or around the eye.  Mild blurred vision. Follow these instructions at home: Eye care   Do not touch or rub your eyes.  Protect your eyes as told by your health care provider. You may be told to wear a protective eye shield or sunglasses.  Do not put a contact lens into the affected eye or eyes until your health care provider approves.  Keep the area around your eye clean and dry: ? Avoid swimming. ? Do not allow water to hit you directly in the face while showering. ? Keep soap and shampoo out of your eyes.  Check your eye every day for signs of infection. Watch for: ? Redness, swelling, or pain. ? Fluid, blood, or pus. ? Warmth. ? A bad smell. ? Vision that is getting worse. ? Sensitivity that is getting worse. Activity  Do not drive for 24 hours if you were given a sedative during your procedure.  Avoid strenuous activities, such as playing contact sports, for as long as told by your health care provider.  Do not drive or use heavy machinery until your health care provider approves.  Do not bend or lift heavy objects. Bending increases pressure in the eye. You can walk, climb stairs, and do light household chores.  Ask your health care provider when you can return to work. If you work in a dusty environment, you may be advised to wear protective eyewear for a period of time. General instructions  Take or apply over-the-counter and prescription medicines only as told by your health care provider. This includes eye drops.  Keep all follow-up visits as told by your health care provider. This is important. Contact a health care provider if:  You have increased bruising around your eye.  You have pain that is  not helped with medicine.  You have a fever.  You have redness, swelling, or pain in your eye.  You have fluid, blood, or pus coming from your incision.  Your vision gets worse.  Your sensitivity to light gets worse. Get help right away if:  You have sudden loss of vision.  You see flashes of light or spots (floaters).  You have severe eye pain.  You develop nausea or vomiting. Summary  After your procedure, it is common to have itching, discomfort, bruising, fluid discharge, or sensitivity to light.  Follow instructions from your health care provider about caring for your eye after the procedure.  Do not rub your eye after the procedure. You may need to wear eye protection or sunglasses. Do not wear contact lenses. Keep the area around your eye clean and dry.  Avoid activities that require a lot of effort. These include playing sports and lifting heavy objects.  Contact a health care provider if you have increased bruising, pain that does not go away, or a fever. Get help right   away if you suddenly lose your vision, see flashes of light or spots, or have severe pain in the eye. This information is not intended to replace advice given to you by your health care provider. Make sure you discuss any questions you have with your health care provider. Document Revised: 12/17/2018 Document Reviewed: 08/20/2017 Elsevier Patient Education  2020 Elsevier Inc.  

## 2020-03-16 ENCOUNTER — Ambulatory Visit: Payer: Medicare Other | Admitting: Anesthesiology

## 2020-03-16 ENCOUNTER — Other Ambulatory Visit: Payer: Self-pay

## 2020-03-16 ENCOUNTER — Ambulatory Visit
Admission: RE | Admit: 2020-03-16 | Discharge: 2020-03-16 | Disposition: A | Discharge status: Home / Self Care | Payer: Medicare Other | Source: Ambulatory Visit | Attending: Ophthalmology | Admitting: Ophthalmology

## 2020-03-16 ENCOUNTER — Encounter: Payer: Self-pay | Admitting: Ophthalmology

## 2020-03-16 ENCOUNTER — Encounter
Admission: RE | Disposition: A | Discharge status: Home / Self Care | Payer: Self-pay | Source: Ambulatory Visit | Attending: Ophthalmology

## 2020-03-16 DIAGNOSIS — Z885 Allergy status to narcotic agent status: Secondary | ICD-10-CM | POA: Diagnosis not present

## 2020-03-16 DIAGNOSIS — Z91041 Radiographic dye allergy status: Secondary | ICD-10-CM | POA: Insufficient documentation

## 2020-03-16 DIAGNOSIS — Z79899 Other long term (current) drug therapy: Secondary | ICD-10-CM | POA: Insufficient documentation

## 2020-03-16 DIAGNOSIS — Z79890 Hormone replacement therapy (postmenopausal): Secondary | ICD-10-CM | POA: Diagnosis not present

## 2020-03-16 DIAGNOSIS — H2511 Age-related nuclear cataract, right eye: Secondary | ICD-10-CM | POA: Diagnosis not present

## 2020-03-16 DIAGNOSIS — Z888 Allergy status to other drugs, medicaments and biological substances status: Secondary | ICD-10-CM | POA: Insufficient documentation

## 2020-03-16 DIAGNOSIS — Z880 Allergy status to penicillin: Secondary | ICD-10-CM | POA: Diagnosis not present

## 2020-03-16 DIAGNOSIS — Z87891 Personal history of nicotine dependence: Secondary | ICD-10-CM | POA: Diagnosis not present

## 2020-03-16 HISTORY — PX: CATARACT EXTRACTION W/PHACO: SHX586

## 2020-03-16 SURGERY — PHACOEMULSIFICATION, CATARACT, WITH IOL INSERTION
Anesthesia: Monitor Anesthesia Care | Site: Eye | Laterality: Right

## 2020-03-16 MED ORDER — NA CHONDROIT SULF-NA HYALURON 40-17 MG/ML IO SOLN
INTRAOCULAR | Status: DC | PRN
  Administered 2020-03-16: 1 mL via INTRAOCULAR

## 2020-03-16 MED ORDER — EPINEPHRINE PF 1 MG/ML IJ SOLN
INTRAOCULAR | Status: DC | PRN
  Administered 2020-03-16: 60 mL via OPHTHALMIC

## 2020-03-16 MED ORDER — BRIMONIDINE TARTRATE-TIMOLOL 0.2-0.5 % OP SOLN
OPHTHALMIC | Status: DC | PRN
  Administered 2020-03-16: 1 [drp] via OPHTHALMIC

## 2020-03-16 MED ORDER — MOXIFLOXACIN HCL 0.5 % OP SOLN
OPHTHALMIC | Status: DC | PRN
  Administered 2020-03-16: 0.2 mL via OPHTHALMIC

## 2020-03-16 MED ORDER — MIDAZOLAM HCL 2 MG/2ML IJ SOLN
INTRAMUSCULAR | Status: DC | PRN
  Administered 2020-03-16: 2 mg via INTRAVENOUS

## 2020-03-16 MED ORDER — TETRACAINE HCL 0.5 % OP SOLN
1.0000 [drp] | OPHTHALMIC | Status: DC | PRN
  Administered 2020-03-16 (×2): 1 [drp] via OPHTHALMIC

## 2020-03-16 MED ORDER — FENTANYL CITRATE (PF) 100 MCG/2ML IJ SOLN
INTRAMUSCULAR | Status: DC | PRN
  Administered 2020-03-16: 50 ug via INTRAVENOUS

## 2020-03-16 MED ORDER — LIDOCAINE HCL (PF) 2 % IJ SOLN
INTRAOCULAR | Status: DC | PRN
  Administered 2020-03-16: 1 mL

## 2020-03-16 MED ORDER — ARMC OPHTHALMIC DILATING DROPS
1.0000 "application " | OPHTHALMIC | Status: DC | PRN
  Administered 2020-03-16 (×3): 1 via OPHTHALMIC

## 2020-03-16 SURGICAL SUPPLY — 21 items
CANNULA ANT/CHMB 27G (MISCELLANEOUS) ×2 IMPLANT
CANNULA ANT/CHMB 27GA (MISCELLANEOUS) ×4 IMPLANT
GLOVE SURG LX 8.0 MICRO (GLOVE) ×2
GLOVE SURG LX STRL 8.0 MICRO (GLOVE) ×1 IMPLANT
GLOVE SURG TRIUMPH 8.0 PF LTX (GLOVE) ×2 IMPLANT
GOWN STRL REUS W/ TWL LRG LVL3 (GOWN DISPOSABLE) ×2 IMPLANT
GOWN STRL REUS W/TWL LRG LVL3 (GOWN DISPOSABLE) ×4
LENS IOL TECNIS EYHANCE 21.0 (Intraocular Lens) ×1 IMPLANT
MARKER SKIN DUAL TIP RULER LAB (MISCELLANEOUS) ×2 IMPLANT
NDL FILTER BLUNT 18X1 1/2 (NEEDLE) ×1 IMPLANT
NEEDLE FILTER BLUNT 18X 1/2SAF (NEEDLE) ×1
NEEDLE FILTER BLUNT 18X1 1/2 (NEEDLE) ×1 IMPLANT
PACK EYE AFTER SURG (MISCELLANEOUS) ×2 IMPLANT
PACK OPTHALMIC (MISCELLANEOUS) ×2 IMPLANT
PACK PORFILIO (MISCELLANEOUS) ×2 IMPLANT
SUT ETHILON 10-0 CS-B-6CS-B-6 (SUTURE)
SUTURE EHLN 10-0 CS-B-6CS-B-6 (SUTURE) IMPLANT
SYR 3ML LL SCALE MARK (SYRINGE) ×2 IMPLANT
SYR TB 1ML LUER SLIP (SYRINGE) ×2 IMPLANT
WATER STERILE IRR 250ML POUR (IV SOLUTION) ×2 IMPLANT
WIPE NON LINTING 3.25X3.25 (MISCELLANEOUS) ×2 IMPLANT

## 2020-03-16 NOTE — Anesthesia Preprocedure Evaluation (Signed)
Anesthesia Evaluation  Patient identified by MRN, date of birth, ID band Patient awake    Reviewed: Allergy & Precautions, NPO status , Patient's Chart, lab work & pertinent test results, reviewed documented beta blocker date and time   History of Anesthesia Complications (+) DIFFICULT AIRWAY and history of anesthetic complications  Airway Mallampati: II  TM Distance: >3 FB Neck ROM: Limited    Dental  (+) Partial Lower   Pulmonary former smoker,    Pulmonary exam normal        Cardiovascular hypertension, (-) angina(-) DOE Normal cardiovascular exam     Neuro/Psych negative neurological ROS  negative psych ROS   GI/Hepatic negative GI ROS, Neg liver ROS,  Diverticulosis IBS   Endo/Other  Hypothyroidism   Renal/GU negative Renal ROS     Musculoskeletal  (+) Arthritis ,   Abdominal Normal abdominal exam  (+)   Peds  Hematology   Anesthesia Other Findings   Reproductive/Obstetrics                             Anesthesia Physical  Anesthesia Plan  ASA: II  Anesthesia Plan: MAC   Post-op Pain Management:    Induction: Intravenous  PONV Risk Score and Plan: 2 and TIVA, Midazolam and Treatment may vary due to age or medical condition  Airway Management Planned: Nasal Cannula and Natural Airway  Additional Equipment:   Intra-op Plan:   Post-operative Plan:   Informed Consent: I have reviewed the patients History and Physical, chart, labs and discussed the procedure including the risks, benefits and alternatives for the proposed anesthesia with the patient or authorized representative who has indicated his/her understanding and acceptance.       Plan Discussed with: CRNA  Anesthesia Plan Comments:         Anesthesia Quick Evaluation

## 2020-03-16 NOTE — H&P (Signed)
[facility]   Primary Care Physician:  Danella Penton, MD Ophthalmologist: Dr. Druscilla Brownie  Pre-Procedure History & Physical: HPI:  Kristi Maldonado is a 80 y.o. female here for cataract surgery.   Past Medical History:  Diagnosis Date  . Degenerative disc disease, lumbar   . Difficult airway for intubation    Patient told by anesthesiology that she should tell future providers to always use a pediatric ET tube due to narrow airway and prior surgical issues including scarring  . Difficult intubation    Narrow airway.  Needs pediatric tube  . Hypertension   . Hypothyroidism   . Thyroid disease   . Vertigo    x1  . Wears dentures    partial lower    Past Surgical History:  Procedure Laterality Date  . CATARACT EXTRACTION W/PHACO Left 01/20/2020   Procedure: CATARACT EXTRACTION PHACO AND INTRAOCULAR LENS PLACEMENT (IOC) LEFT 6.48 00:42.2;  Surgeon: Galen Manila, MD;  Location: [facility];  Service: Ophthalmology;  Laterality: Left;  . SALIVARY GLAND SURGERY    . THYROID SURGERY  2000  . TUBAL LIGATION      Prior to Admission medications   Medication Sig Start Date End Date Taking? Authorizing Provider  ALPRAZolam Prudy Feeler) 0.5 MG tablet Take 1 tablet (0.5 mg total) by mouth every 8 (eight) hours as needed for anxiety. 02/22/20 03/23/20 Yes Paduchowski, Caryn Bee, MD  Ascorbic Acid (VITAMIN C PO) Take by mouth.   Yes [provider]  Calcium Citrate-Vitamin D (CALCIUM + D PO) Take by mouth.   Yes [provider]  estradiol (ESTRACE) 0.1 MG/GM vaginal cream Place 1 Applicatorful vaginally at bedtime.   Yes [provider]  levothyroxine (SYNTHROID, LEVOTHROID) 75 MCG tablet Take 75 mcg by mouth daily before breakfast.   Yes [provider]  Multiple Vitamin (MULTIVITAMIN) tablet Take 1 tablet by mouth daily.   Yes [provider]  rosuvastatin (CRESTOR) 5 MG tablet Take 5 mg by mouth daily.   Yes [provider]   VITAMIN E PO Take by mouth.   Yes [provider]  aspirin EC 81 MG tablet Take 81 mg by mouth daily. Swallow whole. Patient not taking: Reported on 03/16/2020    [provider]    Allergies as of 02/13/2020 - Review Complete 02/01/2020  Allergen Reaction Noted  . Lisinopril Anaphylaxis 06/09/2017  . Amlodipine Swelling 09/15/2019  . Contrast media [iodinated diagnostic agents] Hives and Nausea Only 01/12/2020  . Codeine Rash 01/12/2020  . Penicillins Rash and Other (See Comments)     Family History  Problem Relation Age of Onset  . Breast cancer Maternal Aunt        two aunts  . Breast cancer Cousin        maternal    Social History   Socioeconomic History  . Marital status: Single    Spouse name: Not on file  . Number of children: Not on file  . Years of education: Not on file  . Highest education level: Not on file  Occupational History  . Not on file  Tobacco Use  . Smoking status: Former Games developer  . Smokeless tobacco: Never Used  . Tobacco comment: smoked some in her early 76s  Vaping Use  . Vaping Use: Never used  Substance and Sexual Activity  . Alcohol use: Yes    Alcohol/week: 3.0 standard drinks    Types: 3 Glasses of wine per week  . Drug use: No  . Sexual activity:  Never  Other Topics Concern  . Not on file  Social History Narrative  . Not on file   Social Determinants of Health   Financial Resource Strain: Not on file  Food Insecurity: Not on file  Transportation Needs: Not on file  Physical Activity: Not on file  Stress: Not on file  Social Connections: Not on file  Intimate Partner Violence: Not on file    Review of Systems: See HPI, otherwise negative ROS  Physical Exam: BP (!) 145/61   Pulse (!) 50   Temp (!) 97.3 F (36.3 C) (Temporal)   Ht 5\' 8"  (1.727 m)   Wt 64 kg   SpO2 97%   BMI 21.44 kg/m  General:   Alert,  pleasant and cooperative in NAD Head:  Normocephalic and atraumatic. Respiratory:  Normal work  of breathing.  Impression/Plan: Kristi Maldonado is here for cataract surgery.  Risks, benefits, limitations, and alternatives regarding cataract surgery have been reviewed with the patient.  Questions have been answered.  All parties agreeable.   Towana Badger, MD  03/16/2020, 8:18 AM

## 2020-03-16 NOTE — Transfer of Care (Signed)
Immediate Anesthesia Transfer of Care Note  Patient: Kristi Maldonado  Procedure(s) Performed: CATARACT EXTRACTION PHACO AND INTRAOCULAR LENS PLACEMENT (IOC) RIGHT (Right Eye)  Patient Location: PACU  Anesthesia Type: MAC  Level of Consciousness: awake, alert  and patient cooperative  Airway and Oxygen Therapy: Patient Spontanous Breathing and Patient connected to supplemental oxygen  Post-op Assessment: Post-op Vital signs reviewed, Patient's Cardiovascular Status Stable, Respiratory Function Stable, Patent Airway and No signs of Nausea or vomiting  Post-op Vital Signs: Reviewed and stable  Complications: No complications documented.

## 2020-03-16 NOTE — Op Note (Signed)
PREOPERATIVE DIAGNOSIS:  Nuclear sclerotic cataract of the right eye.   POSTOPERATIVE DIAGNOSIS:  Cataract   OPERATIVE PROCEDURE:@   SURGEON:  Galen Manila, MD.   ANESTHESIA:  Anesthesiologist: Fletcher Anon, MD CRNA: Jinny Blossom, CRNA  1.      Managed anesthesia care. 2.      0.55ml of Shugarcaine was instilled in the eye following the paracentesis.   COMPLICATIONS:  None.   TECHNIQUE:   Stop and chop   DESCRIPTION OF PROCEDURE:  The patient was examined and consented in the preoperative holding area where the aforementioned topical anesthesia was applied to the right eye and then brought back to the Operating Room where the right eye was prepped and draped in the usual sterile ophthalmic fashion and a lid speculum was placed. A paracentesis was created with the side port blade and the anterior chamber was filled with viscoelastic. A near clear corneal incision was performed with the steel keratome. A continuous curvilinear capsulorrhexis was performed with a cystotome followed by the capsulorrhexis forceps. Hydrodissection and hydrodelineation were carried out with BSS on a blunt cannula. The lens was removed in a stop and chop  technique and the remaining cortical material was removed with the irrigation-aspiration handpiece. The capsular bag was inflated with viscoelastic and the Technis [id number]  lens was placed in the capsular bag without complication. The remaining viscoelastic was removed from the eye with the irrigation-aspiration handpiece. The wounds were hydrated. The anterior chamber was flushed with BSS and the eye was inflated to physiologic pressure. 0.66ml of Vigamox was placed in the anterior chamber. The wounds were found to be water tight. The eye was dressed with Combigan. The patient was given protective glasses to wear throughout the day and a shield with which to sleep tonight. The patient was also given drops with which to begin a drop regimen today and will  follow-up with me in one day. Implant Name Type Inv. Item Serial No. Manufacturer Lot No. LRB No. Used Action  LENS IOL TECNIS EYHANCE 21.0 - [id number] Intraocular Lens LENS IOL TECNIS EYHANCE 21.0 [id number] JOHNSON   Right 1 Implanted   Procedure(s) with comments: CATARACT EXTRACTION PHACO AND INTRAOCULAR LENS PLACEMENT (IOC) RIGHT (Right) - 6.11 0:43.5  Electronically signed: Galen Manila 03/16/2020 8:45 AM

## 2020-03-16 NOTE — Anesthesia Postprocedure Evaluation (Signed)
Anesthesia Post Note  Patient: Kristi Maldonado  Procedure(s) Performed: CATARACT EXTRACTION PHACO AND INTRAOCULAR LENS PLACEMENT (IOC) RIGHT (Right Eye)     Patient location during evaluation: PACU Anesthesia Type: MAC Level of consciousness: awake and alert Pain management: pain level controlled Vital Signs Assessment: post-procedure vital signs reviewed and stable Respiratory status: nonlabored ventilation and spontaneous breathing Cardiovascular status: blood pressure returned to baseline Postop Assessment: no apparent nausea or vomiting Anesthetic complications: no   No complications documented.  Brantley Wiley [employer]

## 2020-03-16 NOTE — Anesthesia Procedure Notes (Signed)
Procedure Name: MAC Date/Time: 03/16/2020 8:29 AM Performed by: Jeannene Patella, CRNA Pre-anesthesia Checklist: Patient identified, Emergency Drugs available, Suction available, Timeout performed and Patient being monitored Patient Re-evaluated:Patient Re-evaluated prior to induction Oxygen Delivery Method: Nasal cannula Placement Confirmation: positive ETCO2

## 2020-03-17 ENCOUNTER — Encounter: Payer: Self-pay | Admitting: Ophthalmology

## 2020-04-06 ENCOUNTER — Ambulatory Visit: Payer: Medicare Other | Admitting: Physical Therapy

## 2020-04-07 ENCOUNTER — Other Ambulatory Visit: Payer: Medicare Other

## 2020-04-12 ENCOUNTER — Ambulatory Visit: Payer: Medicare Other | Admitting: Physical Therapy

## 2020-04-22 ENCOUNTER — Ambulatory Visit: Payer: Medicare Other | Attending: Obstetrics and Gynecology | Admitting: Physical Therapy

## 2020-04-22 ENCOUNTER — Other Ambulatory Visit: Payer: Self-pay

## 2020-04-22 VITALS — BP 122/54 | HR 56

## 2020-04-22 DIAGNOSIS — R278 Other lack of coordination: Secondary | ICD-10-CM | POA: Insufficient documentation

## 2020-04-22 DIAGNOSIS — R2689 Other abnormalities of gait and mobility: Secondary | ICD-10-CM | POA: Diagnosis present

## 2020-04-22 DIAGNOSIS — M62838 Other muscle spasm: Secondary | ICD-10-CM | POA: Insufficient documentation

## 2020-04-22 DIAGNOSIS — M5442 Lumbago with sciatica, left side: Secondary | ICD-10-CM | POA: Insufficient documentation

## 2020-04-22 DIAGNOSIS — G8929 Other chronic pain: Secondary | ICD-10-CM | POA: Insufficient documentation

## 2020-04-23 NOTE — Addendum Note (Signed)
Addended by: Mariane Masters on: 04/23/2020 11:39 AM   Modules accepted: Orders

## 2020-04-23 NOTE — Patient Instructions (Addendum)
Deep core level 1 and 2 in reclined position due to cardiac issues  Follow up with cardiologist regarding BP, low energy, appetite, and loose stools after starting new medication

## 2020-04-23 NOTE — Therapy (Addendum)
[facility] MAIN [facility] SERVICES [address] [place], [place], [address] Phone: [phone]   Fax:  [phone]  Physical Therapy Evaluation  Patient Details  Name: Kristi Maldonado MRN: [id number] Date of Birth: Jan 24, 1941 Referring Provider (PT): Cristopher Peru    Encounter Date: 04/22/2020   PT End of Session - 04/22/20 1038    Visit Number 1    Number of Visits 10    Date for PT Re-Evaluation 07/01/20    PT Start Time 1004    PT Stop Time 1100    PT Time Calculation (min) 56 min           Past Medical History:  Diagnosis Date  . Degenerative disc disease, lumbar   . Difficult airway for intubation    Patient told by anesthesiology that she should tell future providers to always use a pediatric ET tube due to narrow airway and prior surgical issues including scarring  . Difficult intubation    Narrow airway.  Needs pediatric tube  . Hypertension   . Hypothyroidism   . Thyroid disease   . Vertigo    x1  . Wears dentures    partial lower    Past Surgical History:  Procedure Laterality Date  . CATARACT EXTRACTION W/PHACO Left 01/20/2020   Procedure: CATARACT EXTRACTION PHACO AND INTRAOCULAR LENS PLACEMENT (IOC) LEFT 6.48 00:42.2;  Surgeon: Galen Manila, MD;  Location: [facility];  Service: Ophthalmology;  Laterality: Left;  . CATARACT EXTRACTION W/PHACO Right 03/16/2020   Procedure: CATARACT EXTRACTION PHACO AND INTRAOCULAR LENS PLACEMENT (IOC) RIGHT;  Surgeon: Galen Manila, MD;  Location: [facility];  Service: Ophthalmology;  Laterality: Right;  6.11 0:43.5  . SALIVARY GLAND SURGERY    . THYROID SURGERY  2000  . TUBAL LIGATION      Vitals:   04/22/20 1027 04/22/20 1030 04/22/20 1100  BP: (!) 121/47 (!) 113/51 (!) 122/54  Pulse: (!) 59  (!) 56      Subjective Assessment - 04/22/20 1013    Subjective Pt had cataract surgery in Nov and Dec. Cardiac issues in Nov 29,2021 with rapid heart beats and minor  mitral valve prolapse. Pt is under the care of cardiologist Dr. Lady Gary.  Pt  has been feeling her prolapse worsening since all of this going on. Pt had not done any of her pelvic floor exercises. Pt is not wearing a pessary. Prolapse is worse right before she urinates. Pt has to move it out of the way more frequently. Prolapse does not interfere with bowel movements. Bowel movements occur daily and are well formed. Last few days they have been loosely formed and she plans to see if this continues as a side effect of her medications. Prolapse is no worse with walking than any other time. Pt has not had back pain.  For the past 3 days, pt has been feeling low energy and has had loose stools and low appetite. Pt was put on a new medications for the past 8 days.    Patient Stated Goals get muscles working better to not have so much of a prolapse              [facility] PT Assessment - 04/23/20 0900      Coordination   Gross Motor Movements are Fluid and Coordinated --   proper diaphragmatic excursion     Sit to Stand   Comments no use of BUE, no genu valgus  Objective measurements completed on examination: See above findings.     Pelvic Floor Special Questions - 04/23/20 0858    Pelvic Floor Internal Exam pt consented verbally and had no contraindications,    Exam Type Vaginal    Palpation standing: bladder not outside introitus, minimal upward movement with cue for contraction. seated in semi-fowlers position: demo'd upward lift with low ab and no upper ab overuse with deep core level 1 and 2                         PT Long Term Goals - 04/23/20 0853      PT LONG TERM GOAL #1   Title Pt will not need to use her finger to push up the bladder out of the way when she first gets up in the morning in order to practice improved hygiene    Time 8    Period Weeks    Status New    Target Date 06/18/20      PT LONG TERM GOAL #2   Title Pt will demo  improved pelvic floor coordination and upward lift inside introitus in standing to minimzie lowered position of bladder    Baseline bladder at introitus when standing    Time 10    Period Weeks    Status New    Target Date 07/02/20      PT LONG TERM GOAL #3   Title Pt will be IND with proper technique doing deep core level 1 and 2 and pelvic floor exercises    Time 10    Period Weeks    Status New    Target Date 07/02/20      PT LONG TERM GOAL #4   Title Pt will demo IND with fitness execises with proper alignment and co-activation with no strain to pelvic floor    Time 4    Period Weeks    Status New    Target Date 05/21/20      PT LONG TERM GOAL #5   Title Pt will improve FOTO score from 29 pts to < 15 pts in order to improve QOL    Baseline 29pts    Time 10    Period Weeks    Status New    Target Date 07/02/20      PT LONG TERM GOAL #6   Time 10    Period Weeks    Status Achieved                  Plan - 04/22/20 1033    Clinical Impression Statement  Pt is 80 yo and reports prolapse Sx with need to manually assist with urination. Pt had completed Pelvic PT last year which helped improve her pelvic floor strength and coordination and facilitated a more upward position of her bladder. However, pt had not kept up with her exercises due to other medical issues that came up for her after completing PT.   Pt's assessment today showed that bladder was not lowered outside of introitus in standing position which is an improvement compared to last year. Pt demo'd slight upward motion of pelvic floor but will require more PT to retrain mm strength. Pt requried minor cues for pelvic floor and deep core coordination.  Pt demo'd IND with deep core HEP after review.   Position for the exercises was modified due to pt's BP issues measured at the beginning of this appt. Pt was placed in semi-fowler position  instead of supine because her BP measurements were:  Vitals:   04/22/20  1027 04/22/20 1030 04/22/20 1100  BP: (!) 121/47 (!) 113/51 (!) 122/54  Pulse: (!) 59  (!) 56   Pt's cardiologist Dr. America Brown office was notified of her low diastole and they were schedule her into an appt today at 2:15pm. They were also notified of her report of Sx across the past 3 days: pt has been feeling low energy and has had loose stools and low appetite. Pt was put on a new medications for the past 8 days.   Plan to continue taking pt's BP with each session and modify POC according to her cardiac conditions. Pt was recommended to ask his cardiologist for a referral for cardiac rehab as pt had worked out in the gym last year but stopped due to rise in COVID cases.   Pt benefits from skilled PT and plan to assess overall global mm strength and guide pt to return to her past pelvic floor related  HEP and address her goals.      Stability/Clinical Decision Making Evolving/Moderate complexity    Rehab Potential Good    PT Frequency 1x / week    PT Duration Other (comment)   10   PT Treatment/Interventions Stair training;Functional mobility training;Therapeutic activities;Therapeutic exercise;Balance training;Neuromuscular re-education;Moist Heat;Patient/family education;Manual techniques;Scar mobilization;Cryotherapy;Taping    Consulted and Agree with Plan of Care Patient           Patient will benefit from skilled therapeutic intervention in order to improve the following deficits and impairments:  Decreased mobility,Decreased coordination,Decreased strength,Decreased range of motion,Increased muscle spasms,Improper body mechanics,Postural dysfunction,Difficulty walking,Hypomobility,Decreased safety awareness,Increased fascial restricitons,Decreased scar mobility,Decreased endurance  Visit Diagnosis: Other lack of coordination  Other muscle spasm     Problem List Patient Active Problem List   Diagnosis Date Noted  . Allergic reaction 06/09/2017  . DIVERTICULITIS, COLON  02/05/2009  . IBS 02/05/2009    Mariane Masters ,PT, DPT, E-RYT  04/23/2020, 9:02 AM  [place] [facility] MAIN [facility] SERVICES [address] [place], [place], [address] Phone: [phone]   Fax:  [phone]  Name: Malijah Lietz MRN: [id number] Date of Birth: 1940/07/17

## 2020-04-29 ENCOUNTER — Other Ambulatory Visit: Payer: Self-pay

## 2020-04-29 ENCOUNTER — Ambulatory Visit: Payer: Medicare Other | Admitting: Physical Therapy

## 2020-04-29 VITALS — BP 102/50

## 2020-04-29 DIAGNOSIS — G8929 Other chronic pain: Secondary | ICD-10-CM

## 2020-04-29 DIAGNOSIS — R278 Other lack of coordination: Secondary | ICD-10-CM

## 2020-04-29 DIAGNOSIS — R2689 Other abnormalities of gait and mobility: Secondary | ICD-10-CM

## 2020-04-29 DIAGNOSIS — M62838 Other muscle spasm: Secondary | ICD-10-CM

## 2020-04-29 DIAGNOSIS — M5442 Lumbago with sciatica, left side: Secondary | ICD-10-CM

## 2020-04-29 NOTE — Patient Instructions (Addendum)
  Clam Shell 45 Degrees  Lying with hips and knees bent 45, one pillow between knees and ankles. Heel together, toes apart like ballerina,  Lift knee with exhale while pressing heels together. Be sure pelvis does not roll backward. Do not arch back. Do 20 times, each leg, 2 times per day.     Complimentary stretch: [employer] _ foot over _ thigh, opposite knee straight with strap  3 breaths  Inhale Exhale with points of contact down and forearm/ shins in perpendicular position , pull thigh close to chest   * Keep pelvis levelled with tactile cue with hand under back of hips   ____   PELVIC FLOOR / KEGEL EXERCISES   Pelvic floor/ Kegel exercises are used to strengthen the muscles in the base of your pelvis that are responsible for supporting your pelvic organs and preventing urine/feces leakage. Based on your therapist's recommendations, they can be performed while standing, sitting, or lying down. Imagine pelvic floor area as a diamond with pelvic landmarks: top =pubic bone, bottom tip=tailbone, sides=sitting bones (ischial tuberosities).    Make yourself aware of this muscle group by using these cues while coordinating your breath:  Inhale, feel pelvic floor diamond area lower like hammock towards your feet and ribcage/belly expanding. Pause. Let the exhale naturally and feel your belly sink, abdominal muscles hugging in around you and you may notice the pelvic diamond draws upward towards your head forming a umbrella shape. Give a squeeze during the exhalation like you are stopping the flow of urine. If you are squeezing the buttock muscles, try to give 50% less effort.   Common Errors:  Breath holding: If you are holding your breath, you may be bearing down against your bladder instead of pulling it up. If you belly bulges up while you are squeezing, you are holding your breath. Be sure to breathe gently in and out while exercising. Counting out loud may help you avoid  holding your breath.  Accessory muscle use: You should not see or feel other muscle movement when performing pelvic floor exercises. When done properly, no one can tell that you are performing the exercises. Keep the buttocks, belly and inner thighs relaxed.  Overdoing it: Your muscles can fatigue and stop working for you if you over-exercise. You may actually leak more or feel soreness at the lower abdomen or rectum.  YOUR HOME EXERCISE PROGRAM   SHORT HOLDS:   Inhale and then exhale. Then squeeze the muscle.  (Be sure to let belly sink in with exhales and not push outward)  Perform 5 repetitions, 3 x  Times/day ( after meals)   Position: on sitting       Perform 5 repetitions, 2 x  Times/day (on bed with pillow under hips)                 DECREASE DOWNWARD PRESSURE ON  YOUR PELVIC FLOOR, ABDOMINAL, LOW BACK MUSCLES       PRESERVE YOUR PELVIC HEALTH LONG-TERM   ** SQUEEZE pelvic floor BEFORE YOUR SNEEZE, COUGH, LAUGH   ** EXHALE BEFORE YOU RISE AGAINST GRAVITY (lifting, sit to stand, from squat to stand)   ** LOG ROLL OUT OF BED INSTEAD OF CRUNCH/SIT-UP   ____ Massage low R to up R of abdomen to up L and down L to stimulate colon for better bowel movements and GOOD JOB for not straining

## 2020-04-29 NOTE — Therapy (Signed)
[place] [facility] MAIN [facility] SERVICES [address] [place], [place], [address] Phone: [phone]   Fax:  [phone]  Physical Therapy Treatment  Patient Details  Name: Kristi Maldonado MRN: [id number] Date of Birth: 09-Jun-1940 Referring Provider (PT): Cristopher Peru    Encounter Date: 04/29/2020   PT End of Session - 04/29/20 1011    Visit Number 2    Number of Visits 10    Date for PT Re-Evaluation 07/01/20    PT Start Time 1000    PT Stop Time 1056    PT Time Calculation (min) 56 min    Activity Tolerance Patient tolerated treatment well    Behavior During Therapy [facility] for tasks assessed/performed           Past Medical History:  Diagnosis Date  . Degenerative disc disease, lumbar   . Difficult airway for intubation    Patient told by anesthesiology that she should tell future providers to always use a pediatric ET tube due to narrow airway and prior surgical issues including scarring  . Difficult intubation    Narrow airway.  Needs pediatric tube  . Hypertension   . Hypothyroidism   . Thyroid disease   . Vertigo    x1  . Wears dentures    partial lower    Past Surgical History:  Procedure Laterality Date  . CATARACT EXTRACTION W/PHACO Left 01/20/2020   Procedure: CATARACT EXTRACTION PHACO AND INTRAOCULAR LENS PLACEMENT (IOC) LEFT 6.48 00:42.2;  Surgeon: Galen Manila, MD;  Location: [facility];  Service: Ophthalmology;  Laterality: Left;  . CATARACT EXTRACTION W/PHACO Right 03/16/2020   Procedure: CATARACT EXTRACTION PHACO AND INTRAOCULAR LENS PLACEMENT (IOC) RIGHT;  Surgeon: Galen Manila, MD;  Location: [facility];  Service: Ophthalmology;  Laterality: Right;  6.11 0:43.5  . SALIVARY GLAND SURGERY    . THYROID SURGERY  2000  . TUBAL LIGATION      Vitals:   04/29/20 1015  BP: (!) 102/50     Subjective Assessment - 04/29/20 1007    Subjective Pt went to see Dr. Lady Gary after last session and HR was normal. Pt  was taken off her medication that day and she no longer had loose stool, dizziness, loss of appetite. Pt is waiting to hear back from cardiac rehab program to see if her insurnace will cover. Dr. Lady Gary agreed the program will be good for her.  Pt noticed her prolapse was a little better. Pt used ssquatty potty which helped her with bowel movements without straining.    Patient Stated Goals get muscles working better to not have so much of a prolapse              [facility] PT Assessment - 04/29/20 1058      Observation/Other Assessments   Observations ab straining with figure 4 stretch                      Pelvic Floor Special Questions - 04/29/20 1058    External Palpation seated 5 reps,  hooklying with pillow under hips 6 reps             [facility] Adult PT Treatment/Exercise - 04/29/20 1058      Therapeutic Activites    Other Therapeutic Activities belly massage education for bette rbowels without straining, monitored blood pressure      Neuro Re-ed    Neuro Re-ed Details  ,  PT Long Term Goals - 04/23/20 0853      PT LONG TERM GOAL #1   Title Pt will not need to use her finger to push up the bladder out of the way when she first gets up in the morning in order to practice improved hygiene    Time 8    Period Weeks    Status New    Target Date 06/18/20      PT LONG TERM GOAL #2   Title Pt will demo improved pelvic floor coordination and upward lift inside introitus in standing to minimzie lowered position of bladder    Baseline bladder at introitus when standing    Time 10    Period Weeks    Status New    Target Date 07/02/20      PT LONG TERM GOAL #3   Title Pt will be IND with proper technique doing deep core level 1 and 2 and pelvic floor exercises    Time 10    Period Weeks    Status New    Target Date 07/02/20      PT LONG TERM GOAL #4   Title Pt will demo IND with fitness execises with proper alignment and  co-activation with no strain to pelvic floor    Time 4    Period Weeks    Status New    Target Date 05/21/20      PT LONG TERM GOAL #5   Title Pt will improve FOTO score from 29 pts to < 15 pts in order to improve QOL    Baseline 29pts    Time 10    Period Weeks    Status New    Target Date 07/02/20      PT LONG TERM GOAL #6   Time 10    Period Weeks    Status Achieved                 Plan - 04/29/20 1012    Clinical Impression Statement Pt no longer has dizziness, loss of appetite, and loose stools since her MD took her off a new medication.   Implemented hip abduction strengthening today with LLE being weaker than R. Provided excessive cues for stability principles to minimize abdominal mm.  Minimal cues for pelvic floor quick contractions in seated and hooklying positions with pillow under hips to promote upward position of prolapse.  Plan to add hip extension next session ( birddog which will also benefit prolapse)   Pt continues to benefit from skilled PT.     Stability/Clinical Decision Making Evolving/Moderate complexity    Rehab Potential Good    PT Frequency 1x / week    PT Duration Other (comment)   10   PT Treatment/Interventions Stair training;Functional mobility training;Therapeutic activities;Therapeutic exercise;Balance training;Neuromuscular re-education;Moist Heat;Patient/family education;Manual techniques;Scar mobilization;Cryotherapy;Taping    Consulted and Agree with Plan of Care Patient           Patient will benefit from skilled therapeutic intervention in order to improve the following deficits and impairments:  Decreased mobility,Decreased coordination,Decreased strength,Decreased range of motion,Increased muscle spasms,Improper body mechanics,Postural dysfunction,Difficulty walking,Hypomobility,Decreased safety awareness,Increased fascial restricitons,Decreased scar mobility,Decreased endurance  Visit Diagnosis: Other lack of  coordination  Other muscle spasm  Other abnormalities of gait and mobility  Chronic low back pain with left-sided sciatica, unspecified back pain laterality     Problem List Patient Active Problem List   Diagnosis Date Noted  . Allergic reaction 06/09/2017  . DIVERTICULITIS, COLON 02/05/2009  .  IBS 02/05/2009    Mariane Masters ,PT, DPT, E-RYT  04/29/2020, 11:11 AM  [place] [facility] MAIN [facility] SERVICES [address] [place], [place], [address] Phone: [phone]   Fax:  [phone]  Name: Kristi Maldonado MRN: [id number] Date of Birth: 04/30/1940

## 2020-05-05 ENCOUNTER — Ambulatory Visit: Payer: Medicare Other | Attending: Obstetrics and Gynecology | Admitting: Physical Therapy

## 2020-05-05 ENCOUNTER — Other Ambulatory Visit: Payer: Self-pay

## 2020-05-05 DIAGNOSIS — M62838 Other muscle spasm: Secondary | ICD-10-CM | POA: Insufficient documentation

## 2020-05-05 DIAGNOSIS — R2689 Other abnormalities of gait and mobility: Secondary | ICD-10-CM | POA: Insufficient documentation

## 2020-05-05 DIAGNOSIS — R278 Other lack of coordination: Secondary | ICD-10-CM | POA: Diagnosis not present

## 2020-05-05 DIAGNOSIS — M5442 Lumbago with sciatica, left side: Secondary | ICD-10-CM | POA: Insufficient documentation

## 2020-05-05 DIAGNOSIS — G8929 Other chronic pain: Secondary | ICD-10-CM | POA: Insufficient documentation

## 2020-05-05 NOTE — Therapy (Signed)
[place] [facility] MAIN [facility] SERVICES [address] [place], [place], [address] Phone: [phone]   Fax:  [phone]  Physical Therapy Treatment  Patient Details  Name: Kristi Maldonado MRN: [id number] Date of Birth: 26-Dec-1940 Referring Provider (PT): Cristopher Peru    Encounter Date: 05/05/2020   PT End of Session - 05/05/20 1107    Visit Number 3    Number of Visits 10    Date for PT Re-Evaluation 07/01/20    PT Start Time 1103    PT Stop Time 1200    PT Time Calculation (min) 57 min    Activity Tolerance Patient tolerated treatment well    Behavior During Therapy [facility] for tasks assessed/performed           Past Medical History:  Diagnosis Date  . Degenerative disc disease, lumbar   . Difficult airway for intubation    Patient told by anesthesiology that she should tell future providers to always use a pediatric ET tube due to narrow airway and prior surgical issues including scarring  . Difficult intubation    Narrow airway.  Needs pediatric tube  . Hypertension   . Hypothyroidism   . Thyroid disease   . Vertigo    x1  . Wears dentures    partial lower    Past Surgical History:  Procedure Laterality Date  . CATARACT EXTRACTION W/PHACO Left 01/20/2020   Procedure: CATARACT EXTRACTION PHACO AND INTRAOCULAR LENS PLACEMENT (IOC) LEFT 6.48 00:42.2;  Surgeon: Galen Manila, MD;  Location: [facility];  Service: Ophthalmology;  Laterality: Left;  . CATARACT EXTRACTION W/PHACO Right 03/16/2020   Procedure: CATARACT EXTRACTION PHACO AND INTRAOCULAR LENS PLACEMENT (IOC) RIGHT;  Surgeon: Galen Manila, MD;  Location: [facility];  Service: Ophthalmology;  Laterality: Right;  6.11 0:43.5  . SALIVARY GLAND SURGERY    . THYROID SURGERY  2000  . TUBAL LIGATION      There were no vitals filed for this visit.   Subjective Assessment - 05/05/20 1108    Subjective Pt reported she had a question about one of the exercises. Pt went to  [facility]h [facility] and got info about the fitness training program . Pt is waiting to find out if it is covered by insurance    Patient Stated Goals get muscles working better to not have so much of a prolapse              [facility] PT Assessment - 05/05/20 1238      Observation/Other Assessments   Observations confusion about exercises      Coordination   Coordination and Movement Description cued for less ab overuse                         [facility] Adult PT Treatment/Exercise - 05/05/20 1238      Therapeutic Activites    Other Therapeutic Activities strategized with pt on compliance, spreading exericses throughout the day, explained to wait on joinin fitness program      Neuro Re-ed    Neuro Re-ed Details  cued for less ab overuse                       PT Long Term Goals - 04/23/20 0853      PT LONG TERM GOAL #1   Title Pt will not need to use her finger to push up the bladder out of the way when she first gets up in the  morning in order to practice improved hygiene    Time 8    Period Weeks    Status New    Target Date 06/18/20      PT LONG TERM GOAL #2   Title Pt will demo improved pelvic floor coordination and upward lift inside introitus in standing to minimzie lowered position of bladder    Baseline bladder at introitus when standing    Time 10    Period Weeks    Status New    Target Date 07/02/20      PT LONG TERM GOAL #3   Title Pt will be IND with proper technique doing deep core level 1 and 2 and pelvic floor exercises    Time 10    Period Weeks    Status New    Target Date 07/02/20      PT LONG TERM GOAL #4   Title Pt will demo IND with fitness execises with proper alignment and co-activation with no strain to pelvic floor    Time 4    Period Weeks    Status New    Target Date 05/21/20      PT LONG TERM GOAL #5   Title Pt will improve FOTO score from 29 pts to < 15 pts in order to improve QOL    Baseline 29pts    Time 10     Period Weeks    Status New    Target Date 07/02/20      PT LONG TERM GOAL #6   Time 10    Period Weeks    Status Achieved                 Plan - 05/05/20 1107    Clinical Impression Statement Pt required motivational interviewing and co-created strategies to be consistent with HEP. Focused on a program that will ensure pelvic floor exercises will be done throughout the day by encouraging her to do them with meals. Provided cues for clam shells and figure 4 stretches and deep core. Pt demo IND and voiced understanding. Advised pt to not begin fitness training at [facility] center until next month to ensure more pelvic floor strength and coordination before engagement of global mm in order to minimize worsening of prolapse. Pt has needed excessive neuromuscular reeducation to achieve proper pelvic floor's engagement. Pt continues to benefit from skilled PT    Stability/Clinical Decision Making Evolving/Moderate complexity    Rehab Potential Good    PT Frequency 1x / week    PT Duration Other (comment)   10   PT Treatment/Interventions Stair training;Functional mobility training;Therapeutic activities;Therapeutic exercise;Balance training;Neuromuscular re-education;Moist Heat;Patient/family education;Manual techniques;Scar mobilization;Cryotherapy;Taping    Consulted and Agree with Plan of Care Patient           Patient will benefit from skilled therapeutic intervention in order to improve the following deficits and impairments:  Decreased mobility,Decreased coordination,Decreased strength,Decreased range of motion,Increased muscle spasms,Improper body mechanics,Postural dysfunction,Difficulty walking,Hypomobility,Decreased safety awareness,Increased fascial restricitons,Decreased scar mobility,Decreased endurance  Visit Diagnosis: Other lack of coordination  Other muscle spasm  Other abnormalities of gait and mobility  Chronic low back pain with left-sided sciatica, unspecified  back pain laterality     Problem List Patient Active Problem List   Diagnosis Date Noted  . Allergic reaction 06/09/2017  . DIVERTICULITIS, COLON 02/05/2009  . IBS 02/05/2009    Mariane Masters  ,PT, DPT, E-RYT  05/05/2020, 1:01 PM  [place] [facility] MAIN [facility] SERVICES [address] [place], [place], [address] Phone: [phone]   Fax:  [phone]  Name: Kristi Maldonado MRN: [id number] Date of Birth: 16-Jan-1941

## 2020-05-05 NOTE — Patient Instructions (Signed)
MORNING      1) Clamshells x 20 reps 2)  Figure-4  5 reps with slow breathing  WITH PILLOW UNDER HIPS  1) Deep core level 1 (breathing 10 rep)  2) Deep core level 2  (move knee  SET TIMER 6 min)       Breakfast/ Lunch/ Dinner                         before eating - SEATED  1)  5 quick reps  after eating - SEATED  2)  Belly massage for digestion  From bottom R, upper R,            upper L, lower L  3 x     3) 5 quick reps     Evening     1) Clamshells x 20 reps 2) Figure-4  5 reps with slow breathing  WITH PILLOW UNDER HIPS 1) Deep core level 1 (breathing 10 rep)  2) Deep core level 2  (move knee  SET A TIMER 6 min)    Morning and evening Sunday Monday Tuesday Wed Thurs Friday Sat                                    Pelvic floor with meals  Sunday Monday Tuesday Wed Thurs Friday Sat

## 2020-05-13 ENCOUNTER — Ambulatory Visit: Payer: Medicare Other | Admitting: Physical Therapy

## 2020-05-21 ENCOUNTER — Other Ambulatory Visit: Payer: Self-pay

## 2020-05-21 ENCOUNTER — Ambulatory Visit: Payer: Medicare Other | Admitting: Physical Therapy

## 2020-05-21 VITALS — BP 120/60

## 2020-05-21 DIAGNOSIS — M62838 Other muscle spasm: Secondary | ICD-10-CM

## 2020-05-21 DIAGNOSIS — R278 Other lack of coordination: Secondary | ICD-10-CM | POA: Diagnosis not present

## 2020-05-21 DIAGNOSIS — R2689 Other abnormalities of gait and mobility: Secondary | ICD-10-CM

## 2020-05-21 DIAGNOSIS — G8929 Other chronic pain: Secondary | ICD-10-CM

## 2020-05-21 NOTE — Patient Instructions (Signed)
  Proper body mechanics with getting out of a chair to decrease strain  on back &pelvic floor   Avoid holding your breath when Getting out of the chair:  Scoot to front part of chair chair Heels behind knees,LINE UP KNEES WIDE with toes,  feet are hip width apart, nose over toes  Inhale like you are smelling roses Exhale to stand     ___  .Blue band seated with deep core level 2 min   ___  Band behind shoulder, w arms and exhale, extend arms like bird wings  10 reps

## 2020-05-21 NOTE — Therapy (Signed)
[place] [facility] MAIN [facility] SERVICES [address] [place], [place], [address] Phone: [phone]   Fax:  [phone]  Physical Therapy Treatment  Patient Details  Name: Kristi Maldonado MRN: [id number] Date of Birth: Jun 11, 1940 Referring Provider (PT): Cristopher Peru    Encounter Date: 05/21/2020   PT End of Session - 05/21/20 1059    Visit Number 4    Number of Visits 10    Date for PT Re-Evaluation 07/01/20    PT Start Time 1000    PT Stop Time 1056    PT Time Calculation (min) 56 min    Activity Tolerance Patient tolerated treatment well    Behavior During Therapy [facility] for tasks assessed/performed           Past Medical History:  Diagnosis Date  . Degenerative disc disease, lumbar   . Difficult airway for intubation    Patient told by anesthesiology that she should tell future providers to always use a pediatric ET tube due to narrow airway and prior surgical issues including scarring  . Difficult intubation    Narrow airway.  Needs pediatric tube  . Hypertension   . Hypothyroidism   . Thyroid disease   . Vertigo    x1  . Wears dentures    partial lower    Past Surgical History:  Procedure Laterality Date  . CATARACT EXTRACTION W/PHACO Left 01/20/2020   Procedure: CATARACT EXTRACTION PHACO AND INTRAOCULAR LENS PLACEMENT (IOC) LEFT 6.48 00:42.2;  Surgeon: Galen Manila, MD;  Location: [facility];  Service: Ophthalmology;  Laterality: Left;  . CATARACT EXTRACTION W/PHACO Right 03/16/2020   Procedure: CATARACT EXTRACTION PHACO AND INTRAOCULAR LENS PLACEMENT (IOC) RIGHT;  Surgeon: Galen Manila, MD;  Location: [facility];  Service: Ophthalmology;  Laterality: Right;  6.11 0:43.5  . SALIVARY GLAND SURGERY    . THYROID SURGERY  2000  . TUBAL LIGATION      Vitals:   05/21/20 1059  BP: 120/60     Subjective Assessment - 05/21/20 1007    Subjective Pt reported she tried to sneeze and had leakage. Pt has been trying to  do her kegel exercises. Pt will be scheduled a sleep study by her cardiologist. Pt had palpitations last night.    Patient Stated Goals get muscles working better to not have so much of a prolapse              [facility] PT Assessment - 05/21/20 1101      Coordination   Coordination and Movement Description proper diaphragmatic / pelvic floor coordination                      Pelvic Floor Special Questions - 05/21/20 1100    External Perineal Exam through clothing, proper contraction 6 reps, quick with pillow under hips, no ab straining cues needed             [facility] Adult PT Treatment/Exercise - 05/21/20 1100      Therapeutic Activites    Other Therapeutic Activities monitored BP,  upgraded HEP to seated position, provided education on positive self-talk and positive encouragement to build pt's confidence with techniques of pelvic coordination      Neuro Re-ed    Neuro Re-ed Details  cued for seated resistance band deep core for abduction strengthening , new shoulder strengthening HEP                       PT Long  Term Goals - 04/23/20 0853      PT LONG TERM GOAL #1   Title Pt will not need to use her finger to push up the bladder out of the way when she first gets up in the morning in order to practice improved hygiene    Time 8    Period Weeks    Status New    Target Date 06/18/20      PT LONG TERM GOAL #2   Title Pt will demo improved pelvic floor coordination and upward lift inside introitus in standing to minimzie lowered position of bladder    Baseline bladder at introitus when standing    Time 10    Period Weeks    Status New    Target Date 07/02/20      PT LONG TERM GOAL #3   Title Pt will be IND with proper technique doing deep core level 1 and 2 and pelvic floor exercises    Time 10    Period Weeks    Status New    Target Date 07/02/20      PT LONG TERM GOAL #4   Title Pt will demo IND with fitness execises with proper alignment  and co-activation with no strain to pelvic floor    Time 4    Period Weeks    Status New    Target Date 05/21/20      PT LONG TERM GOAL #5   Title Pt will improve FOTO score from 29 pts to < 15 pts in order to improve QOL    Baseline 29pts    Time 10    Period Weeks    Status New    Target Date 07/02/20      PT LONG TERM GOAL #6   Time 10    Period Weeks    Status Achieved                 Plan - 05/21/20 1102    Clinical Impression Statement Pt demo'd good carry over with proper deep core coordination and pelvic floor contractions with no cues. Progressed hip abduction and deep core strengthening in seated position and added shoulder strengthening. Pt demo'd correct form after cues. Plan to progress to bird dog exercise and progress to long contractions of pelvic floor mm next session . Pt continues to benefit from skilled PT.    Stability/Clinical Decision Making Evolving/Moderate complexity    Rehab Potential Good    PT Frequency 1x / week    PT Duration Other (comment)   10   PT Treatment/Interventions Stair training;Functional mobility training;Therapeutic activities;Therapeutic exercise;Balance training;Neuromuscular re-education;Moist Heat;Patient/family education;Manual techniques;Scar mobilization;Cryotherapy;Taping    Consulted and Agree with Plan of Care Patient           Patient will benefit from skilled therapeutic intervention in order to improve the following deficits and impairments:  Decreased mobility,Decreased coordination,Decreased strength,Decreased range of motion,Increased muscle spasms,Improper body mechanics,Postural dysfunction,Difficulty walking,Hypomobility,Decreased safety awareness,Increased fascial restricitons,Decreased scar mobility,Decreased endurance  Visit Diagnosis: Other lack of coordination  Other muscle spasm  Other abnormalities of gait and mobility  Chronic low back pain with left-sided sciatica, unspecified back pain  laterality     Problem List Patient Active Problem List   Diagnosis Date Noted  . Allergic reaction 06/09/2017  . DIVERTICULITIS, COLON 02/05/2009  . IBS 02/05/2009    Mariane Masters ,PT, DPT, E-RYT  05/21/2020, 11:05 AM  [place] [facility] MAIN [facility] SERVICES [address] [place],  [place], [address] Phone: [phone]   Fax:  [phone]  Name: Kristi Maldonado MRN: [id number] Date of Birth: 1940-10-02

## 2020-06-04 ENCOUNTER — Ambulatory Visit: Payer: Medicare Other | Admitting: Physical Therapy

## 2020-06-11 ENCOUNTER — Ambulatory Visit: Payer: Medicare Other | Attending: Obstetrics and Gynecology | Admitting: Physical Therapy

## 2020-06-11 ENCOUNTER — Other Ambulatory Visit: Payer: Self-pay

## 2020-06-11 DIAGNOSIS — M533 Sacrococcygeal disorders, not elsewhere classified: Secondary | ICD-10-CM | POA: Insufficient documentation

## 2020-06-11 DIAGNOSIS — R2689 Other abnormalities of gait and mobility: Secondary | ICD-10-CM | POA: Insufficient documentation

## 2020-06-11 DIAGNOSIS — G8929 Other chronic pain: Secondary | ICD-10-CM | POA: Insufficient documentation

## 2020-06-11 DIAGNOSIS — M5442 Lumbago with sciatica, left side: Secondary | ICD-10-CM | POA: Diagnosis present

## 2020-06-11 DIAGNOSIS — R278 Other lack of coordination: Secondary | ICD-10-CM | POA: Diagnosis present

## 2020-06-11 DIAGNOSIS — M62838 Other muscle spasm: Secondary | ICD-10-CM | POA: Diagnosis present

## 2020-06-11 NOTE — Therapy (Signed)
[place] MAIN [facility] SERVICES [address] [place], [place], [address] Phone: [phone]   Fax:  [phone]  Physical Therapy Treatment  Patient Details  Name: Kristi Maldonado MRN: [id number] Date of Birth: [birth year] Referring Provider (PT): Pleas Patricia    Encounter Date: 06/11/2020   PT End of Session - 06/11/20 1104    Visit Number 5    Number of Visits 10    Date for PT Re-Evaluation 07/01/20    PT Start Time 1003    PT Stop Time 1100    PT Time Calculation (min) 57 min    Activity Tolerance Patient tolerated treatment well    Behavior During Therapy [facility] for tasks assessed/performed           Past Medical History:  Diagnosis Date  . Degenerative disc disease, lumbar   . Difficult airway for intubation    Patient told by anesthesiology that she should tell future providers to always use a pediatric ET tube due to narrow airway and prior surgical issues including scarring  . Difficult intubation    Narrow airway.  Needs pediatric tube  . Hypertension   . Hypothyroidism   . Thyroid disease   . Vertigo    x1  . Wears dentures    partial lower    Past Surgical History:  Procedure Laterality Date  . CATARACT EXTRACTION W/PHACO Left 01/20/2020   Procedure: CATARACT EXTRACTION PHACO AND INTRAOCULAR LENS PLACEMENT (IOC) LEFT 6.48 00:42.2;  Surgeon: Birder Robson, MD;  Location: [place];  Service: Ophthalmology;  Laterality: Left;  . CATARACT EXTRACTION W/PHACO Right 03/16/2020   Procedure: CATARACT EXTRACTION PHACO AND INTRAOCULAR LENS PLACEMENT ([place]) RIGHT;  Surgeon: Birder Robson, MD;  Location: [place];  Service: Ophthalmology;  Laterality: Right;  6.11 0:43.5  . SALIVARY GLAND SURGERY    . THYROID SURGERY  2000  . TUBAL LIGATION      There were Maldonado vitals filed for this visit.   Subjective Assessment - 06/11/20 1013    Subjective Pt reported she cancelled last week due to palpaitations. Pt also has  had GI issues. She    Patient Stated Goals get muscles working better to not have so much of a prolapse              [facility] PT Assessment - 06/11/20 1048      Observation/Other Assessments   Observations green band with seated clams and deep core with less difficulty compared to blue band      Coordination   Coordination and Movement Description Maldonado lumbar lordosis in birddog,                      Pelvic Floor Special Questions - 06/11/20 1054    External Perineal Exam mild cues for posterior excursion of diaphragm in seated pelvic floor exercises             [facility] Adult PT Treatment/Exercise - 06/11/20 1549      Therapeutic Activites    Other Therapeutic Activities active listening, provided resources for health management , access to  low income health care, referral to psychotherapist      Neuro Re-ed    Neuro Re-ed Details  cued for birddog modified, propioception of CKC                       PT Long Term Goals - 06/11/20 1553      PT LONG TERM GOAL #  1   Title Pt will not need to use her finger to push up the bladder out of the way when she first gets up in the morning in order to practice improved hygiene    Time 8    Period Weeks    Status On-going      PT LONG TERM GOAL #2   Title Pt will demo improved pelvic floor coordination and upward lift inside introitus in standing to minimzie lowered position of bladder    Baseline bladder at introitus when standing    Time 10    Period Weeks    Status On-going      PT LONG TERM GOAL #3   Title Pt will be IND with proper technique doing deep core level 1 and 2 and pelvic floor exercises    Time 10    Period Weeks    Status On-going      PT LONG TERM GOAL #4   Title Pt will demo IND with fitness execises with proper alignment and co-activation with Maldonado strain to pelvic floor    Time 4    Period Weeks    Status Partially Met      PT LONG TERM GOAL #5   Title Pt will improve FOTO score  from 29 pts to < 15 pts in order to improve QOL    Baseline 29pts    Time 10    Period Weeks    Status On-going      PT LONG TERM GOAL #6   Time 10    Period Weeks    Status Achieved                 Plan - 06/11/20 1105    Clinical Impression Statement Pt is progressing well with seated exercises but required a down grade of resistance band from blue to green in clams and deep core Level 2. Advanced pt to birddog which pt demo'd proper form in spine but required cues for CKC modification. Anticipate this will help with prolapse in antigravity position while strengthening gluts. Provided active listening to pt's medical issues and provided resources for psychotherapist to help manage stress and low income resources for helping pt plan for retirement community living. Pt continues to benefit from skilled PT   Stability/Clinical Decision Making Evolving/Moderate complexity    Rehab Potential Good    PT Frequency 1x / week    PT Duration Other (comment)   10   PT Treatment/Interventions Stair training;Functional mobility training;Therapeutic activities;Therapeutic exercise;Balance training;Neuromuscular re-education;Moist Heat;Patient/family education;Manual techniques;Scar mobilization;Cryotherapy;Taping    Consulted and Agree with Plan of Care Patient           Patient will benefit from skilled therapeutic intervention in order to improve the following deficits and impairments:  Decreased mobility,Decreased coordination,Decreased strength,Decreased range of motion,Increased muscle spasms,Improper body mechanics,Postural dysfunction,Difficulty walking,Hypomobility,Decreased safety awareness,Increased fascial restricitons,Decreased scar mobility,Decreased endurance  Visit Diagnosis: Other lack of coordination  Other muscle spasm  Other abnormalities of gait and mobility  Chronic low back pain with left-sided sciatica, unspecified back pain laterality     Problem  List Patient Active Problem List   Diagnosis Date Noted  . Allergic reaction 06/09/2017  . DIVERTICULITIS, COLON 02/05/2009  . IBS 02/05/2009    Jerl Mina ,PT, DPT, E-RYT  06/11/2020, 3:53 PM  [place] MAIN [facility] SERVICES [address] [place], [place], [address] Phone: [phone]   Fax:  [phone]  Name: Kristi Maldonado MRN: [id number] Date of  Birth: 23-Jul-1940

## 2020-06-11 NOTE — Patient Instructions (Signed)
Birddog   Table top position,  6 points of contact: paw hands, knees hip width apart, toes tucked under  Shoulders down and back like squeezing armpits  Not sagging back   L arm up , thumbs up, arm is out like a half"V" shoulder blade slides down and back  R knee straight, toes tucked on the ground,  Lengthen whole spine as if yard stick is balanced on spine, chin tucked   L + R = 1 rep 10 reps   X 2   __   USe green band instead of blue with seated clams and deep core

## 2020-06-14 ENCOUNTER — Other Ambulatory Visit: Payer: Self-pay | Admitting: Family Medicine

## 2020-06-14 DIAGNOSIS — R109 Unspecified abdominal pain: Secondary | ICD-10-CM

## 2020-06-14 DIAGNOSIS — R110 Nausea: Secondary | ICD-10-CM

## 2020-06-18 ENCOUNTER — Encounter: Payer: Medicare Other | Admitting: Physical Therapy

## 2020-06-24 ENCOUNTER — Ambulatory Visit
Admission: RE | Admit: 2020-06-24 | Discharge: 2020-06-24 | Disposition: A | Discharge status: Home / Self Care | Payer: Medicare Other | Source: Ambulatory Visit | Attending: Family Medicine | Admitting: Family Medicine

## 2020-06-24 ENCOUNTER — Other Ambulatory Visit: Payer: Self-pay | Admitting: Family Medicine

## 2020-06-24 ENCOUNTER — Other Ambulatory Visit: Payer: Self-pay

## 2020-06-24 DIAGNOSIS — R109 Unspecified abdominal pain: Secondary | ICD-10-CM | POA: Insufficient documentation

## 2020-06-24 DIAGNOSIS — R110 Nausea: Secondary | ICD-10-CM | POA: Insufficient documentation

## 2020-06-24 MED ORDER — IOHEXOL 350 MG/ML SOLN
75.0000 mL | Freq: Once | INTRAVENOUS | Status: AC | PRN
  Administered 2020-06-24: 75 mL via INTRAVENOUS

## 2020-06-25 ENCOUNTER — Ambulatory Visit: Payer: Medicare Other | Admitting: Physical Therapy

## 2020-06-30 ENCOUNTER — Ambulatory Visit: Payer: Medicare Other | Admitting: Physical Therapy

## 2020-06-30 ENCOUNTER — Other Ambulatory Visit: Payer: Self-pay

## 2020-06-30 DIAGNOSIS — R278 Other lack of coordination: Secondary | ICD-10-CM | POA: Diagnosis not present

## 2020-06-30 DIAGNOSIS — M62838 Other muscle spasm: Secondary | ICD-10-CM

## 2020-06-30 DIAGNOSIS — M5442 Lumbago with sciatica, left side: Secondary | ICD-10-CM

## 2020-06-30 DIAGNOSIS — R2689 Other abnormalities of gait and mobility: Secondary | ICD-10-CM

## 2020-06-30 DIAGNOSIS — G8929 Other chronic pain: Secondary | ICD-10-CM

## 2020-06-30 DIAGNOSIS — M533 Sacrococcygeal disorders, not elsewhere classified: Secondary | ICD-10-CM

## 2020-06-30 NOTE — Patient Instructions (Signed)
   childs poses rocking  Toes tucked, shoulders down and back, on forearms , hands shoulder width apart Inhale back  Exhale forward 10 reps   __  Quick squeeze with pelvic floor 5 reps all fours   __  On belly: Riding horse edge of mattress  knee bent like riding a horse, move knee towards armpit and out  10 reps  ___    Try pelvic floor exercise in sidelying and when doing clamshells  Find anterior tilt of pelvis   ___  Sit with anterior tilt of pelvis and avoid  not sitting on tailbone

## 2020-07-01 NOTE — Therapy (Signed)
[place] MAIN [facility] SERVICES [address] [place], [place], [address] Phone: [phone]   Fax:  [phone]  Physical Therapy Treatment  Patient Details  Name: Kristi Maldonado MRN: [id number] Date of Birth: 13-Apr-1940 Referring Provider (PT): Pleas Patricia    Encounter Date: 06/30/2020   PT End of Session - 06/30/20 1101    Visit Number 6    Number of Visits 10    Date for PT Re-Evaluation 09/09/20    PT Start Time 1004    PT Stop Time 1105    PT Time Calculation (min) 61 min    Activity Tolerance Patient tolerated treatment well    Behavior During Therapy [facility] for tasks assessed/performed           Past Medical History:  Diagnosis Date  . Degenerative disc disease, lumbar   . Difficult airway for intubation    Patient told by anesthesiology that she should tell future providers to always use a pediatric ET tube due to narrow airway and prior surgical issues including scarring  . Difficult intubation    Narrow airway.  Needs pediatric tube  . Hypertension   . Hypothyroidism   . Thyroid disease   . Vertigo    x1  . Wears dentures    partial lower    Past Surgical History:  Procedure Laterality Date  . CATARACT EXTRACTION W/PHACO Left 01/20/2020   Procedure: CATARACT EXTRACTION PHACO AND INTRAOCULAR LENS PLACEMENT ([facility]) LEFT 6.48 00:42.2;  Surgeon: Birder Robson, MD;  Location: [place];  Service: Ophthalmology;  Laterality: Left;  . CATARACT EXTRACTION W/PHACO Right 03/16/2020   Procedure: CATARACT EXTRACTION PHACO AND INTRAOCULAR LENS PLACEMENT ([place]) RIGHT;  Surgeon: Birder Robson, MD;  Location: [place];  Service: Ophthalmology;  Laterality: Right;  6.11 0:43.5  . SALIVARY GLAND SURGERY    . THYROID SURGERY  2000  . TUBAL LIGATION      There were no vitals filed for this visit.   Subjective Assessment - 06/30/20 1007    Subjective Pt reported yesterday  she got up a step stool to put something on  top closet shelf and then noticed a pain in the crotch area. When she had to urinate, she noticed her bladder had descended and it hurt to push it aside and it felt like there was more there. It hurts at the low abdomen area. Pt has not had any burning sensation, nor pain with urination and bowel movements. Her bowels have been sluggish and she took Doculax and had no problem with bowel movments. Pt has been trying to drink more water. Prior to yesterday, pt noticed that when she pushed her bladder out of the way to urinate, it felt ok. Pt had felt the prolapse had dropped a little more prior to yesterday.    Patient Stated Goals get muscles working better to not have so much of a prolapse              [facility] PT Assessment - 07/01/20 1226      Observation/Other Assessments   Observations sacral sitting                      Pelvic Floor Special Questions - 07/01/20 1220    External Perineal Exam standing: anterior/ posterior prolapse present outside introitus with minimal contraction upward  ( post Tx: inside introitus , no pain with sit <> stand). hooklying: overuse of ab on 2/10 epelvic floor contractions, able to self correct  [place] Adult PT Treatment/Exercise - 07/01/20 1218      Therapeutic Activites    Other Therapeutic Activities discussed sitting posture,      Neuro Re-ed    Neuro Re-ed Details  excessive cues for anterior tilt of pelvis      Moist Heat Therapy   Number Minutes Moist Heat 5 Minutes    Moist Heat Location --   sacrum in prone     Manual Therapy   Manual therapy comments superior glide of sacrum, sacrococcgyeal ligament, coccygeus B,                       PT Long Term Goals - 07/01/20 1222      PT LONG TERM GOAL #1   Title Pt will not need to use her finger to push up the bladder out of the way when she first gets up in the morning in order to practice improved hygiene    Time 8    Period Weeks    Status On-going       PT LONG TERM GOAL #2   Title Pt will demo improved pelvic floor coordination and upward lift inside introitus in standing to minimzie lowered position of bladder    Baseline bladder at introitus when standing    Time 10    Period Weeks    Status On-going      PT LONG TERM GOAL #3   Title Pt will be IND with proper technique doing deep core level 1 and 2 and pelvic floor exercises    Time 10    Period Weeks    Status On-going      PT LONG TERM GOAL #4   Title Pt will demo IND with fitness execises with proper alignment and co-activation with no strain to pelvic floor    Time 4    Period Weeks    Status Partially Met      PT LONG TERM GOAL #5   Title Pt will improve FOTO score from 29 pts to < 15 pts in order to improve QOL    Baseline 29pts    Time 10    Period Weeks    Status On-going                    Plan - 06/30/20 1102    Clinical Impression Statement Pt had become more compliant with exercises but she also needed biopsyschosocial approaches as she was dealing with medical issues and was stressed about home repairs.   Prolapse Sx was manageable until a recent relapse. She had felt more recently increased pain towards rectal area when moving her bladder out of the way with urination and sit to stand. Findings today include prolapse both anterior and posterior and outside introitus in standing, flexed coccyx, and counternutated sacrum.   Manual Tx addressed flexed coccyx ad pt required excessive cues for anterior tilt of pelvis. Pt requried less cues for not using abdominal mm with pelvic floor contractions. Following Tx, pt  she felt relief. HEP in quadriped position was added to promote anterior tilt of pelvis and cranial position of pelvic organs.   Pt still would like to avoid surgery. Discussed she is a good candidate for pessary and after speaking to her she would be willing to try again. She had negative experience at Dr. Buel Ream office when she was fitted  last time and the pessary was too large and she had stitches because her vaginal tissue  tore when they were trying to remove it.  Perhaps she would do better with a pessary with proper size. She has learned proper technique to coordinate her pelvic floor upward instead of pushing downward which can help pessary fitting. Plan to assess for scar restrictions on those stitches. Pt has a scheduled appt with gynecologist next week. Communicated with gynecologist re: relapse of Sx.    Pt continues to benefits from skilled PT.       Stability/Clinical Decision Making Evolving/Moderate complexity    Rehab Potential Good    PT Frequency 1x / week    PT Duration Other (comment)   10   PT Treatment/Interventions Stair training;Functional mobility training;Therapeutic activities;Therapeutic exercise;Balance training;Neuromuscular re-education;Moist Heat;Patient/family education;Manual techniques;Scar mobilization;Cryotherapy;Taping    Consulted and Agree with Plan of Care Patient           Patient will benefit from skilled therapeutic intervention in order to improve the following deficits and impairments:  Decreased mobility,Decreased coordination,Decreased strength,Decreased range of motion,Increased muscle spasms,Improper body mechanics,Postural dysfunction,Difficulty walking,Hypomobility,Decreased safety awareness,Increased fascial restricitons,Decreased scar mobility,Decreased endurance  Visit Diagnosis: Other lack of coordination  Sacrococcygeal disorders, not elsewhere classified  Other muscle spasm  Other abnormalities of gait and mobility  Chronic low back pain with left-sided sciatica, unspecified back pain laterality     Problem List Patient Active Problem List   Diagnosis Date Noted  . Allergic reaction 06/09/2017  . DIVERTICULITIS, COLON 02/05/2009  . IBS 02/05/2009    Jerl Mina ,PT, DPT, E-RYT  07/01/2020, 12:28 PM  [place]  MAIN [facility] SERVICES [address] [place], [place], [address] Phone: [phone]   Fax:  [phone]  Name: Kristi Maldonado MRN: [id number] Date of Birth: [birth year]

## 2020-07-05 ENCOUNTER — Other Ambulatory Visit: Payer: Self-pay

## 2020-07-05 ENCOUNTER — Ambulatory Visit: Payer: Medicare Other | Attending: Obstetrics and Gynecology | Admitting: Physical Therapy

## 2020-07-05 DIAGNOSIS — G8929 Other chronic pain: Secondary | ICD-10-CM | POA: Diagnosis present

## 2020-07-05 DIAGNOSIS — R278 Other lack of coordination: Secondary | ICD-10-CM | POA: Insufficient documentation

## 2020-07-05 DIAGNOSIS — M533 Sacrococcygeal disorders, not elsewhere classified: Secondary | ICD-10-CM | POA: Insufficient documentation

## 2020-07-05 DIAGNOSIS — R2689 Other abnormalities of gait and mobility: Secondary | ICD-10-CM | POA: Insufficient documentation

## 2020-07-05 DIAGNOSIS — M5442 Lumbago with sciatica, left side: Secondary | ICD-10-CM | POA: Insufficient documentation

## 2020-07-05 DIAGNOSIS — M62838 Other muscle spasm: Secondary | ICD-10-CM | POA: Insufficient documentation

## 2020-07-06 ENCOUNTER — Ambulatory Visit: Payer: Medicare Other | Admitting: Physical Therapy

## 2020-07-06 ENCOUNTER — Ambulatory Visit: Payer: Medicare Other

## 2020-07-06 ENCOUNTER — Other Ambulatory Visit: Payer: Self-pay

## 2020-07-06 DIAGNOSIS — M533 Sacrococcygeal disorders, not elsewhere classified: Secondary | ICD-10-CM

## 2020-07-06 DIAGNOSIS — R2689 Other abnormalities of gait and mobility: Secondary | ICD-10-CM

## 2020-07-06 DIAGNOSIS — M5442 Lumbago with sciatica, left side: Secondary | ICD-10-CM

## 2020-07-06 DIAGNOSIS — G8929 Other chronic pain: Secondary | ICD-10-CM

## 2020-07-06 DIAGNOSIS — R278 Other lack of coordination: Secondary | ICD-10-CM

## 2020-07-06 DIAGNOSIS — M62838 Other muscle spasm: Secondary | ICD-10-CM

## 2020-07-06 NOTE — Patient Instructions (Addendum)
Heel raises 20reps x 3 x day  Hand on wall   Forearms over bed, hip extension, leg lifts to strengtehn gluts 20 reps x 3 x day   Use child pose rocking to reposition prolapse then sit with more comfort  If have to sit without the opportunity to reposition prolapse, use towel folded in diamond to offload pressure at perineal area.    __  Quick squeezes in child pose position 5 reps x 5 x day

## 2020-07-06 NOTE — Therapy (Signed)
[place] MAIN [facility] SERVICES [address] [place], [place], [address] Phone: [phone]   Fax:  [phone]  Physical Therapy Treatment  Patient Details  Name: Kristi Maldonado MRN: [id number] Date of Birth: Mar 27, 1940 Referring Provider (PT): Pleas Patricia    Encounter Date: 07/05/2020   PT End of Session - 07/06/20 1219    Visit Number 7    Number of Visits 10    Date for PT Re-Evaluation 09/09/20    PT Start Time 0802    PT Stop Time 0900    PT Time Calculation (min) 58 min    Activity Tolerance Patient tolerated treatment well    Behavior During Therapy [facility] for tasks assessed/performed           Past Medical History:  Diagnosis Date  . Degenerative disc disease, lumbar   . Difficult airway for intubation    Patient told by anesthesiology that she should tell future providers to always use a pediatric ET tube due to narrow airway and prior surgical issues including scarring  . Difficult intubation    Narrow airway.  Needs pediatric tube  . Hypertension   . Hypothyroidism   . Thyroid disease   . Vertigo    x1  . Wears dentures    partial lower    Past Surgical History:  Procedure Laterality Date  . CATARACT EXTRACTION W/PHACO Left 01/20/2020   Procedure: CATARACT EXTRACTION PHACO AND INTRAOCULAR LENS PLACEMENT ([facility]) LEFT 6.48 00:42.2;  Surgeon: Birder Robson, MD;  Location: [place];  Service: Ophthalmology;  Laterality: Left;  . CATARACT EXTRACTION W/PHACO Right 03/16/2020   Procedure: CATARACT EXTRACTION PHACO AND INTRAOCULAR LENS PLACEMENT ([place]) RIGHT;  Surgeon: Birder Robson, MD;  Location: [place];  Service: Ophthalmology;  Laterality: Right;  6.11 0:43.5  . SALIVARY GLAND SURGERY    . THYROID SURGERY  2000  . TUBAL LIGATION      There were no vitals filed for this visit.   Subjective Assessment - 07/05/20 0812    Subjective Pt reported she did not have BM on 3 days. Pt took Docalax and then  had a BM to avoid straining.   Pt has been better with the pain near her rectum when pushing bladder up with peeing and walking. Pt notices a sharp sharp in the front low abdomen when walking and so pt has not walked.    Patient Stated Goals get muscles working better to not have so much of a prolapse                     Coccyx less flexed and more sacral nutation      Pelvic Floor Special Questions - 07/06/20 1214    Prolapse Anterior Wall;Posterior Wall;Other   standing, hooklying. ( inside introitus after childs pose rocking)   Pelvic Floor Internal Exam pt consented verbally and had no contraidications    Palpation scar restrictions anterior wall between 11-2 oclock with concordant pain pattern ( decreased post Tx)    Strength fair squeeze, definite lift   able to distinguish beetween 2/5 versuse 3/5 squeeze with lift and able to self-correct            [facility] Adult PT Treatment/Exercise - 07/06/20 1219      Therapeutic Activites    Other Therapeutic Activities discussed pessary fitting with her improved coordination will yielld  better success , physiology and alignment to anterior tilt of pelvis      Neuro Re-ed  Neuro Re-ed Details  cued for anterior tilt in sitting, minor cues for less ab overuse      Moist Heat Therapy   Number Minutes Moist Heat 5 Minutes    Moist Heat Location --   perineum through sheets ( not billed)     Manual Therapy   Internal Pelvic Floor scar mobilizations with MWM                       PT Long Term Goals - 07/01/20 1222      PT LONG TERM GOAL #1   Title Pt will not need to use her finger to push up the bladder out of the way when she first gets up in the morning in order to practice improved hygiene    Time 8    Period Weeks    Status On-going      PT LONG TERM GOAL #2   Title Pt will demo improved pelvic floor coordination and upward lift inside introitus in standing to minimzie lowered position of bladder     Baseline bladder at introitus when standing    Time 10    Period Weeks    Status On-going      PT LONG TERM GOAL #3   Title Pt will be IND with proper technique doing deep core level 1 and 2 and pelvic floor exercises    Time 10    Period Weeks    Status On-going      PT LONG TERM GOAL #4   Title Pt will demo IND with fitness execises with proper alignment and co-activation with no strain to pelvic floor    Time 4    Period Weeks    Status Partially Met      PT LONG TERM GOAL #5   Title Pt will improve FOTO score from 29 pts to < 15 pts in order to improve QOL    Baseline 29pts    Time 10    Period Weeks    Status On-going      PT LONG TERM GOAL #6   Time 10    Period --    Status --                 Plan - 07/06/20 1219    Clinical Impression Statement Pt had positive outcomes with last week's Tx which decreased posterior rectal pain. Coccyx no longer flexed and sacrum has more nutation. Prolapse of anterior and posterior wall still present outside introitus in standing and hooklyinh but child pose rocking ( quadriped position) facilitates position inside introitus temporarily. Pt is able distinguish when she is contracting a pelvic floor contraction strength 2/5 versus 3/5 squeeze with lift and able to self-correct with less abdominal overuse with less cues.   Anterior pelvic pain was associated with scar restrictions which related to stitches placed after her failed pessary fitting. Pt reported less pain following internal Tx to minimize restrictions.  No additional manual Tx was applied to address prolapse as therapist wanted pt to present to MD appt at baseline position of prolapse. Message was emailed to MD re: the past Tx and changes to her pain.    Pt was educated that with her ability to perform correct pelvic floor lift and has decreased mm tightness, pt is not pushing down with abdominal mm and thus, pessary placement can be better maintained. Pt voiced  understanding and expressed she would like to avoid surgery. Pt will be seeing  gynecologist later this afternoon. Plan to communicate with GYN MD. Pt will be seen 2 x week this week which is a special case outside her typical 1 x week POC because pt 's pain needed to be addressed and to f/u after GYN appt.  Pt continues to benefit from skilled PT     Stability/Clinical Decision Making Evolving/Moderate complexity    Rehab Potential Good    PT Frequency 1x / week    PT Duration Other (comment)   10   PT Treatment/Interventions Stair training;Functional mobility training;Therapeutic activities;Therapeutic exercise;Balance training;Neuromuscular re-education;Moist Heat;Patient/family education;Manual techniques;Scar mobilization;Cryotherapy;Taping    Consulted and Agree with Plan of Care Patient           Patient will benefit from skilled therapeutic intervention in order to improve the following deficits and impairments:  Decreased mobility,Decreased coordination,Decreased strength,Decreased range of motion,Increased muscle spasms,Improper body mechanics,Postural dysfunction,Difficulty walking,Hypomobility,Decreased safety awareness,Increased fascial restricitons,Decreased scar mobility,Decreased endurance  Visit Diagnosis: Sacrococcygeal disorders, not elsewhere classified  Other lack of coordination  Other muscle spasm  Other abnormalities of gait and mobility     Problem List Patient Active Problem List   Diagnosis Date Noted  . Allergic reaction 06/09/2017  . DIVERTICULITIS, COLON 02/05/2009  . IBS 02/05/2009    Jerl Mina ,PT, DPT, E-RYT  07/06/2020, 12:30 PM  [place] MAIN [facility] SERVICES [address] [place], [place], [address] Phone: [phone]   Fax:  [phone]  Name: Syenna Nazir MRN: [id number] Date of Birth: 31-Jan-1941

## 2020-07-06 NOTE — Therapy (Signed)
[place] MAIN [facility] SERVICES [address] [place], [place], [address] Phone: [phone]   Fax:  [phone]  Physical Therapy Treatment  Patient Details  Name: Kristi Maldonado MRN: [id number] Date of Birth: 1940/09/04 Referring Provider (PT): Pleas Patricia    Encounter Date: 07/06/2020   PT End of Session - 07/06/20 1649    Visit Number 8    Number of Visits 10    Date for PT Re-Evaluation 09/09/20    PT Start Time 1406    PT Stop Time 1500    PT Time Calculation (min) 54 min    Activity Tolerance Patient tolerated treatment well    Behavior During Therapy [facility] for tasks assessed/performed           Past Medical History:  Diagnosis Date  . Degenerative disc disease, lumbar   . Difficult airway for intubation    Patient told by anesthesiology that she should tell future providers to always use a pediatric ET tube due to narrow airway and prior surgical issues including scarring  . Difficult intubation    Narrow airway.  Needs pediatric tube  . Hypertension   . Hypothyroidism   . Thyroid disease   . Vertigo    x1  . Wears dentures    partial lower    Past Surgical History:  Procedure Laterality Date  . CATARACT EXTRACTION W/PHACO Left 01/20/2020   Procedure: CATARACT EXTRACTION PHACO AND INTRAOCULAR LENS PLACEMENT ([facility]) LEFT 6.48 00:42.2;  Surgeon: Birder Robson, MD;  Location: [place];  Service: Ophthalmology;  Laterality: Left;  . CATARACT EXTRACTION W/PHACO Right 03/16/2020   Procedure: CATARACT EXTRACTION PHACO AND INTRAOCULAR LENS PLACEMENT ([place]) RIGHT;  Surgeon: Birder Robson, MD;  Location: [place];  Service: Ophthalmology;  Laterality: Right;  6.11 0:43.5  . SALIVARY GLAND SURGERY    . THYROID SURGERY  2000  . TUBAL LIGATION      There were no vitals filed for this visit.   Subjective Assessment - 07/06/20 1628    Subjective Pt reported she saw her GYN and she had labs drawn and scheduled  another visit for pessary fitting. Pt feels more pain in the front abdomen and the prolapse is more lowered after sweeping this morning. Pt has trouble sitting when it is lowered.    Patient Stated Goals get muscles working better to not have so much of a prolapse              [facility] PT Assessment - 07/06/20 1639      Observation/Other Assessments   Observations leaning over bed, hip extensions with opp scaption caused more lowered prolapse and thus withheld. Hip extension performed propped on forearm without UR movement      Strength   Overall Strength Comments PF with UE on wall, 10 reps B                      Pelvic Floor Special Questions - 07/06/20 1634    Prolapse Anterior Wall;Posterior Wall;Other   standing, hooklying more lowered compared to yesterday         . ( inside introitus after childs pose rocking)            [facility] Adult PT Treatment/Exercise - 07/06/20 1645      Therapeutic Activites    Other Therapeutic Activities discussed using child pose rocking to reposition prolapse and then sit to avoid pain with sitting , discussed and demonstrated position of geting off bed  to minimize against gravity moveements to minimize downward strain of pelvic organs.      Neuro Re-ed    Neuro Re-ed Details  cued for body mechanics with sweeping, sitting with modification of towel when prolapse is lowered ,      Exercises   Exercises --   see pt isntructions                      PT Long Term Goals - 07/01/20 1222      PT LONG TERM GOAL #1   Title Pt will not need to use her finger to push up the bladder out of the way when she first gets up in the morning in order to practice improved hygiene    Time 8    Period Weeks    Status On-going      PT LONG TERM GOAL #2   Title Pt will demo improved pelvic floor coordination and upward lift inside introitus in standing to minimzie lowered position of bladder    Baseline bladder at introitus when standing     Time 10    Period Weeks    Status On-going      PT LONG TERM GOAL #3   Title Pt will be IND with proper technique doing deep core level 1 and 2 and pelvic floor exercises    Time 10    Period Weeks    Status On-going      PT LONG TERM GOAL #4   Title Pt will demo IND with fitness execises with proper alignment and co-activation with no strain to pelvic floor    Time 4    Period Weeks    Status Partially Met      PT LONG TERM GOAL #5   Title Pt will improve FOTO score from 29 pts to < 15 pts in order to improve QOL    Baseline 29pts    Time 10    Period Weeks    Status On-going      PT LONG TERM GOAL #6   Time 10    Period --    Status --                 Plan - 07/06/20 1649    Clinical Impression Statement Pt demo'd lowered prolapse compared to yesterday.  Child pose rocking with manual assistance helped to bring prolapsed organs back inside introitus. Pt was educated on utilizing this position and practice before sitting. Provided folded towel technique to minimize load on perineal area when she is not able to provide upward positioning of prolapse. Pt was educated on body mechanics with sweeping and getting out of bed to minimize downward straining of pelvic organs.  Pt demo'd improved coordination pelvic floor contractions in childs pose position with less cues.   Added CKC lower kinetic chain strengthening and hip strengthening in modified positions to maintain strength and conditioning and co-activation of pelvic floor. Pt continues to benefit from skilled PT.   Stability/Clinical Decision Making Evolving/Moderate complexity    Rehab Potential Good    PT Frequency 1x / week    PT Duration Other (comment)   10   PT Treatment/Interventions Stair training;Functional mobility training;Therapeutic activities;Therapeutic exercise;Balance training;Neuromuscular re-education;Moist Heat;Patient/family education;Manual techniques;Scar mobilization;Cryotherapy;Taping     Consulted and Agree with Plan of Care Patient           Patient will benefit from skilled therapeutic intervention in order to improve the following deficits and impairments:  Decreased mobility,Decreased coordination,Decreased strength,Decreased range of motion,Increased muscle spasms,Improper body mechanics,Postural dysfunction,Difficulty walking,Hypomobility,Decreased safety awareness,Increased fascial restricitons,Decreased scar mobility,Decreased endurance  Visit Diagnosis: Sacrococcygeal disorders, not elsewhere classified  Other lack of coordination  Other muscle spasm  Other abnormalities of gait and mobility  Chronic low back pain with left-sided sciatica, unspecified back pain laterality     Problem List Patient Active Problem List   Diagnosis Date Noted  . Allergic reaction 06/09/2017  . DIVERTICULITIS, COLON 02/05/2009  . IBS 02/05/2009    Jerl Mina ,PT, DPT, E-RYT   07/06/2020, 4:54 PM  [place] MAIN [facility] SERVICES [address] [place], [place], [address] Phone: [phone]   Fax:  [phone]  Name: Kristi Maldonado MRN: [id number] Date of Birth: 15-Nov-1940

## 2020-07-08 ENCOUNTER — Other Ambulatory Visit: Payer: Self-pay | Admitting: Internal Medicine

## 2020-07-08 DIAGNOSIS — Z1231 Encounter for screening mammogram for malignant neoplasm of breast: Secondary | ICD-10-CM

## 2020-07-13 ENCOUNTER — Other Ambulatory Visit: Payer: Self-pay

## 2020-07-13 ENCOUNTER — Ambulatory Visit: Payer: Medicare Other | Admitting: Physical Therapy

## 2020-07-13 DIAGNOSIS — M62838 Other muscle spasm: Secondary | ICD-10-CM

## 2020-07-13 DIAGNOSIS — R2689 Other abnormalities of gait and mobility: Secondary | ICD-10-CM

## 2020-07-13 DIAGNOSIS — M533 Sacrococcygeal disorders, not elsewhere classified: Secondary | ICD-10-CM | POA: Diagnosis not present

## 2020-07-13 DIAGNOSIS — R278 Other lack of coordination: Secondary | ICD-10-CM

## 2020-07-13 DIAGNOSIS — G8929 Other chronic pain: Secondary | ICD-10-CM

## 2020-07-13 NOTE — Therapy (Signed)
[place] MAIN [facility] SERVICES [address] [place], [place], [address] Phone: [phone]   Fax:  [phone]  Physical Therapy Treatment  Patient Details  Name: Kristi Maldonado MRN: [id number] Date of Birth: 1941/03/04 Referring Provider (PT): Pleas Patricia    Encounter Date: 07/13/2020   PT End of Session - 07/13/20 1013    Visit Number 9    Number of Visits 10    Date for PT Re-Evaluation 09/09/20    PT Start Time 1010    PT Stop Time 1100    PT Time Calculation (min) 50 min    Activity Tolerance Patient tolerated treatment well    Behavior During Therapy [facility] for tasks assessed/performed           Past Medical History:  Diagnosis Date  . Degenerative disc disease, lumbar   . Difficult airway for intubation    Patient told by anesthesiology that she should tell future providers to always use a pediatric ET tube due to narrow airway and prior surgical issues including scarring  . Difficult intubation    Narrow airway.  Needs pediatric tube  . Hypertension   . Hypothyroidism   . Thyroid disease   . Vertigo    x1  . Wears dentures    partial lower    Past Surgical History:  Procedure Laterality Date  . CATARACT EXTRACTION W/PHACO Left 01/20/2020   Procedure: CATARACT EXTRACTION PHACO AND INTRAOCULAR LENS PLACEMENT ([facility]) LEFT 6.48 00:42.2;  Surgeon: Birder Robson, MD;  Location: [place];  Service: Ophthalmology;  Laterality: Left;  . CATARACT EXTRACTION W/PHACO Right 03/16/2020   Procedure: CATARACT EXTRACTION PHACO AND INTRAOCULAR LENS PLACEMENT ([place]) RIGHT;  Surgeon: Birder Robson, MD;  Location: [place];  Service: Ophthalmology;  Laterality: Right;  6.11 0:43.5  . SALIVARY GLAND SURGERY    . THYROID SURGERY  2000  . TUBAL LIGATION      There were no vitals filed for this visit.   Subjective Assessment - 07/13/20 1011    Subjective Pt reported she has been feeling alot of R low abdominal pain and  lying down. Pt contacted OB/GYN for pessary fitting. Pt will be seen by GI on the 07/30/20 as referred by her PCP. Pt also had sleep study unit last night. Pt continues to take Miralax but it has not helped her. Pt is going to try the prune juice mixture recommended by MD.  Pt states it hurts to push back up the back part.    Patient Stated Goals get muscles working better to not have so much of a prolapse              [facility] PT Assessment - 07/13/20 1731      Sit to Stand   Comments altered pattern due to prolapse                      Pelvic Floor Special Questions - 07/13/20 1149    Prolapse Anterior Wall;Posterior Wall;Other   standing   . ( inside introitus after manual assist by PT and childs pose rocking )   Strength good squeeze, good lift, able to hold agaisnt strong resistance    Strength # of reps 10    Strength # of seconds 1   in quadriped, cued for less dyscoordination/ rectal superficial contraction and responded to cue for low abdominal coactivation            [facility] Adult PT Treatment/Exercise - 07/13/20  1732      Therapeutic Activites    Other Therapeutic Activities answered pt's questions, communicated with MD re: pt's concerns about posterior wall and needing pessary fitting sooner than later, surgery, discussed practcing hygiene with use of medical gloves when urinating to minimize risk for infections with need to manual assist prolapse before urinating, discussed referral to nutritionist to improve constipation      Neuro Re-ed    Neuro Re-ed Details  cued for more co-activation of abdominal wall for upward movement of pelvic floor and less ab straining in quadriped      Manual Therapy   Manual therapy comments manual assist of prolapse in quadriped                       PT Long Term Goals - 07/01/20 1222      PT LONG TERM GOAL #1   Title Pt will not need to use her finger to push up the bladder out of the way when she first gets up  in the morning in order to practice improved hygiene    Time 8    Period Weeks    Status On-going      PT LONG TERM GOAL #2   Title Pt will demo improved pelvic floor coordination and upward lift inside introitus in standing to minimzie lowered position of bladder    Baseline bladder at introitus when standing    Time 10    Period Weeks    Status On-going      PT LONG TERM GOAL #3   Title Pt will be IND with proper technique doing deep core level 1 and 2 and pelvic floor exercises    Time 10    Period Weeks    Status On-going      PT LONG TERM GOAL #4   Title Pt will demo IND with fitness execises with proper alignment and co-activation with no strain to pelvic floor    Time 4    Period Weeks    Status Partially Met      PT LONG TERM GOAL #5   Title Pt will improve FOTO score from 29 pts to < 15 pts in order to improve QOL    Baseline 29pts    Time 10    Period Weeks    Status On-going      PT LONG TERM GOAL #6   Time 10    Period --    Status --                 Plan - 07/13/20 1014    Clinical Impression Statement Pt demo'd lowered position of anterior and posterior wall in standing, similar to last week. Pt required manual assist by PT to bring prolapse inside introitus. Guided pt to perform childs pose rocking and 10 quick pelvic floor squeezes in quadriped position. Pt demo'd improved upward movement of pelvic floor with cue to co-activate low abdomen. Pt was able to self-correct without cues when creating downward movement to pelvic floor.    Answered pt's questions, communicated with MD re: pt's concerns about posterior wall and needing pessary fitting sooner than later, surgery, discussed practcing hygiene with use of medical gloves when urinating to minimize risk for infections with need to manual assist prolapse before urinating, discussed referral to nutritionist to improve constipation.  Pt reported less sharp pain in low abdomen post Tx. Pt was educated  on getting off bed with less straining to  pelvic floor. Discussed keeping shoes on a stool or placing foot on stool when doff/don shoes to minimize bending forward/ loading pelvic floor. Pt continues to benefit from skilled PT and modify past HEP BLE strengthening exercises in positions to minimize loading pelvic floor./ worsening of prolapse.       Stability/Clinical Decision Making Evolving/Moderate complexity    Rehab Potential Good    PT Frequency 1x / week    PT Duration Other (comment)   10   PT Treatment/Interventions Stair training;Functional mobility training;Therapeutic activities;Therapeutic exercise;Balance training;Neuromuscular re-education;Moist Heat;Patient/family education;Manual techniques;Scar mobilization;Cryotherapy;Taping    Consulted and Agree with Plan of Care Patient           Patient will benefit from skilled therapeutic intervention in order to improve the following deficits and impairments:  Decreased mobility,Decreased coordination,Decreased strength,Decreased range of motion,Increased muscle spasms,Improper body mechanics,Postural dysfunction,Difficulty walking,Hypomobility,Decreased safety awareness,Increased fascial restricitons,Decreased scar mobility,Decreased endurance  Visit Diagnosis: Sacrococcygeal disorders, not elsewhere classified  Other lack of coordination  Other muscle spasm  Other abnormalities of gait and mobility  Chronic low back pain with left-sided sciatica, unspecified back pain laterality     Problem List Patient Active Problem List   Diagnosis Date Noted  . Allergic reaction 06/09/2017  . DIVERTICULITIS, COLON 02/05/2009  . IBS 02/05/2009    Jerl Mina ,PT, DPT, E-RYT  07/13/2020, 5:36 PM  [place] MAIN [facility] SERVICES [address] [place], [place], [address] Phone: [phone]   Fax:  [phone]  Name: Kristi Maldonado MRN: [id number] Date of Birth: Jul 23, 1940

## 2020-07-19 ENCOUNTER — Other Ambulatory Visit: Payer: Self-pay

## 2020-07-19 ENCOUNTER — Ambulatory Visit
Admission: RE | Admit: 2020-07-19 | Discharge: 2020-07-19 | Disposition: A | Discharge status: Home / Self Care | Payer: Medicare Other | Source: Ambulatory Visit | Attending: Internal Medicine | Admitting: Internal Medicine

## 2020-07-19 DIAGNOSIS — Z1231 Encounter for screening mammogram for malignant neoplasm of breast: Secondary | ICD-10-CM | POA: Diagnosis not present

## 2020-07-20 ENCOUNTER — Ambulatory Visit: Payer: Medicare Other | Admitting: Physical Therapy

## 2020-07-27 ENCOUNTER — Other Ambulatory Visit: Payer: Self-pay

## 2020-07-27 ENCOUNTER — Ambulatory Visit: Payer: Medicare Other | Admitting: Physical Therapy

## 2020-07-27 DIAGNOSIS — M5442 Lumbago with sciatica, left side: Secondary | ICD-10-CM

## 2020-07-27 DIAGNOSIS — R2689 Other abnormalities of gait and mobility: Secondary | ICD-10-CM

## 2020-07-27 DIAGNOSIS — M533 Sacrococcygeal disorders, not elsewhere classified: Secondary | ICD-10-CM

## 2020-07-27 DIAGNOSIS — M62838 Other muscle spasm: Secondary | ICD-10-CM

## 2020-07-27 DIAGNOSIS — G8929 Other chronic pain: Secondary | ICD-10-CM

## 2020-07-27 DIAGNOSIS — R278 Other lack of coordination: Secondary | ICD-10-CM

## 2020-07-27 NOTE — Therapy (Addendum)
[place] MAIN [facility] SERVICES [address] [place], [place], [address] Phone: [phone]   Fax:  [phone]  Physical Therapy Treatment / Discharge Summary   Patient Details  Name: Kristi Maldonado MRN: [id number] Date of Birth: [birth year] Referring Provider (PT): Pleas Patricia    Encounter Date: 07/27/2020   PT End of Session - 07/27/20 1014    Visit Number 10    Number of Visits 10    Date for PT Re-Evaluation 09/09/20    PT Start Time 1007    PT Stop Time 1100    PT Time Calculation (min) 53 min    Activity Tolerance Patient tolerated treatment well    Behavior During Therapy [facility] for tasks assessed/performed           Past Medical History:  Diagnosis Date  . Degenerative disc disease, lumbar   . Difficult airway for intubation    Patient told by anesthesiology that she should tell future providers to always use a pediatric ET tube due to narrow airway and prior surgical issues including scarring  . Difficult intubation    Narrow airway.  Needs pediatric tube  . Hypertension   . Hypothyroidism   . Thyroid disease   . Vertigo    x1  . Wears dentures    partial lower    Past Surgical History:  Procedure Laterality Date  . CATARACT EXTRACTION W/PHACO Left 01/20/2020   Procedure: CATARACT EXTRACTION PHACO AND INTRAOCULAR LENS PLACEMENT ([facility]) LEFT 6.48 00:42.2;  Surgeon: Birder Robson, MD;  Location: [place];  Service: Ophthalmology;  Laterality: Left;  . CATARACT EXTRACTION W/PHACO Right 03/16/2020   Procedure: CATARACT EXTRACTION PHACO AND INTRAOCULAR LENS PLACEMENT ([place]) RIGHT;  Surgeon: Birder Robson, MD;  Location: [place];  Service: Ophthalmology;  Laterality: Right;  6.11 0:43.5  . SALIVARY GLAND SURGERY    . THYROID SURGERY  2000  . TUBAL LIGATION      There were no vitals filed for this visit.   Subjective Assessment - 07/27/20 1009    Subjective Pt reported she is not feeling well . She felt  she had to have a bowel movement but she did not want to take a Docalax and  come here . She does not feel like eating. She does not feel this is like constipation bceause it feels like the low badomen area is very tight tight. SOmetimes she feels like she has gas. She knows there is something when she has not had a bowel movement in 2 days. Pt saw a urogyncologist who also recommended surgery for prolapse.    Patient Stated Goals get muscles working better to not have so much of a prolapse                                          PT Long Term Goals - 07/27/20 1015      PT LONG TERM GOAL #1   Title Pt will not need to use her finger to push up the bladder out of the way when she first gets up in the morning in order to practice improved hygiene    Time 8    Period Weeks    Status Not Met      PT LONG TERM GOAL #2   Title Pt will demo improved pelvic floor coordination and upward lift inside introitus in standing to minimzie lowered  position of bladder    Baseline bladder at introitus when standing    Time 10    Period Weeks    Status Achieved      PT LONG TERM GOAL #3   Title Pt will be IND with proper technique doing deep core level 1 and 2 and pelvic floor exercises    Time 10    Period Weeks    Status Achieved      PT LONG TERM GOAL #4   Title Pt will demo IND with fitness execises with proper alignment and co-activation with no strain to pelvic floor    Time 4    Period Weeks    Status Partially Met      PT LONG TERM GOAL #5   Title Pt will improve FOTO score from 29 pts to < 15 pts in order to improve QOL  (07/27/20: 4pts)    Baseline 29pts    Time 10    Period Weeks    Status Achieved      PT LONG TERM GOAL #6   Time 10                 Plan - 07/27/20 1014    Clinical Impression Statement Pt has achieved 3/5 goals, partially met one goal, and not met one goal. Pt has shown improved pelvic floor coordination and ability to  perform an upward lift of mm despite no improved position of bladder. Pt also is IND with modified exercises and body mechanics to minimize worsening of prolapse. Pt also has shown improved hip strength with compliance to HEP. Pt was provided nutritionist recommendation to help her with constipation which can help minimize worsening of prolapse. Pt has agreed for hystererectomy as recommended by her urogynecologist as a solution for her prolapse. Pt will benefit from pelvic PT post-op. Pt is ready for d/c at this time.    Stability/Clinical Decision Making Evolving/Moderate complexity    Rehab Potential Good    PT Frequency 1x / week    PT Duration Other (comment)   10   PT Treatment/Interventions Stair training;Functional mobility training;Therapeutic activities;Therapeutic exercise;Balance training;Neuromuscular re-education;Moist Heat;Patient/family education;Manual techniques;Scar mobilization;Cryotherapy;Taping    Consulted and Agree with Plan of Care Patient           Patient will benefit from skilled therapeutic intervention in order to improve the following deficits and impairments:  Decreased mobility,Decreased coordination,Decreased strength,Decreased range of motion,Increased muscle spasms,Improper body mechanics,Postural dysfunction,Difficulty walking,Hypomobility,Decreased safety awareness,Increased fascial restricitons,Decreased scar mobility,Decreased endurance  Visit Diagnosis: Sacrococcygeal disorders, not elsewhere classified  Other lack of coordination  Other muscle spasm  Other abnormalities of gait and mobility  Chronic low back pain with left-sided sciatica, unspecified back pain laterality     Problem List Patient Active Problem List   Diagnosis Date Noted  . Allergic reaction 06/09/2017  . DIVERTICULITIS, COLON 02/05/2009  . IBS 02/05/2009    Jerl Mina ,PT, DPT, E-RYT  07/27/2020, 10:33 AM  [place] MAIN [facility]  SERVICES [address] [place], [place], [address] Phone: [phone]   Fax:  [phone]  Name: Kristi Maldonado MRN: [id number] Date of Birth: 03-16-1940

## 2020-08-03 ENCOUNTER — Ambulatory Visit: Payer: Medicare Other | Admitting: Physical Therapy

## 2020-11-23 DIAGNOSIS — R102 Pelvic and perineal pain: Secondary | ICD-10-CM | POA: Insufficient documentation

## 2020-11-23 DIAGNOSIS — N393 Stress incontinence (female) (male): Secondary | ICD-10-CM | POA: Insufficient documentation

## 2021-01-24 ENCOUNTER — Ambulatory Visit: Payer: Self-pay | Admitting: Medical

## 2021-03-09 ENCOUNTER — Ambulatory Visit: Payer: Medicare Other | Admitting: Dermatology

## 2021-06-14 ENCOUNTER — Other Ambulatory Visit: Payer: Self-pay | Admitting: Internal Medicine

## 2021-06-14 DIAGNOSIS — Z1231 Encounter for screening mammogram for malignant neoplasm of breast: Secondary | ICD-10-CM

## 2021-07-14 ENCOUNTER — Ambulatory Visit (INDEPENDENT_AMBULATORY_CARE_PROVIDER_SITE_OTHER): Payer: Medicare Other | Admitting: Dermatology

## 2021-07-14 DIAGNOSIS — B079 Viral wart, unspecified: Secondary | ICD-10-CM | POA: Diagnosis not present

## 2021-07-14 DIAGNOSIS — R21 Rash and other nonspecific skin eruption: Secondary | ICD-10-CM

## 2021-07-14 MED ORDER — MOMETASONE FUROATE 0.1 % EX CREA: 1.0000 "application " | TOPICAL_CREAM | Freq: Every day | CUTANEOUS | 0 refills | Status: DC

## 2021-07-14 NOTE — Progress Notes (Signed)
? ?  Follow-Up Visit ?  ?Subjective  ?Kristi Maldonado is a 81 y.o. female who presents for the following: Rash (Patient here today for an itchy spot at right temple. Patient advises it was red and puffy but that has improved, it is still itchy. Present for 2 days, she did clean with some alcohol and applied neosporin. ). ? ?No hx of eczema, no new moisturizer or shampoos. ? ?Patient with a spot at right hand she would like checked.  ? ?The following portions of the chart were reviewed this encounter and updated as appropriate:  ?  ?  ? ?Review of Systems:  No other skin or systemic complaints except as noted in HPI or Assessment and Plan. ? ?Objective  ?Well appearing patient in no apparent distress; mood and affect are within normal limits. ? ?A focused examination was performed including face, right hand. Relevant physical exam findings are noted in the Assessment and Plan. ? ?right temple ?Pink edematous scaly patch  ? ?right thenar hand ?Verrucous papule -- Discussed viral etiology and contagion.  ? ? ? ?Assessment & Plan  ?Rash ?right temple ? ?Contact vrs seborrheic dermatitis ? ?Patient advised to not use alcohol or neosporin on area.  ? ?Start mometasone cream twice daily to affected area until itchy rash cleared, no longer than 2 weeks.  If not resolved in 2 weeks or if area spreads, patient should RTC.  ? ?mometasone (ELOCON) 0.1 % cream - right temple ?Apply 1 application. topically daily. Apply to affected area twice daily as needed for itch. ? ?Viral warts, unspecified type ?right thenar hand ? ?Discussed viral etiology and risk of spread.  Discussed multiple treatments may be required to clear warts.  Discussed possible post-treatment dyspigmentation and risk of recurrence. ? ?Destruction of lesion - right thenar hand ? ?Destruction method: cryotherapy   ?Informed consent: discussed and consent obtained   ?Lesion destroyed using liquid nitrogen: Yes   ?Region frozen until ice ball extended beyond lesion:  Yes   ?Outcome: patient tolerated procedure well with no complications   ?Post-procedure details: wound care instructions given   ?Additional details:  Prior to procedure, discussed risks of blister formation, small wound, skin dyspigmentation, or rare scar following cryotherapy. Recommend Vaseline ointment to treated areas while healing.  ? ? ?Return if symptoms worsen or fail to improve. ? ?Anise Salvo, RMA, am acting as scribe for Willeen Niece, MD . ? ?Documentation: I have reviewed the above documentation for accuracy and completeness, and I agree with the above. ? ?Willeen Niece MD  ? ?

## 2021-07-14 NOTE — Patient Instructions (Signed)

## 2021-07-20 ENCOUNTER — Encounter (HOSPITAL_COMMUNITY): Payer: Self-pay | Admitting: Radiology

## 2021-07-20 ENCOUNTER — Ambulatory Visit
Admission: RE | Admit: 2021-07-20 | Discharge: 2021-07-20 | Disposition: A | Discharge status: Home / Self Care | Payer: Medicare Other | Source: Ambulatory Visit | Attending: Internal Medicine | Admitting: Internal Medicine

## 2021-07-20 DIAGNOSIS — Z1231 Encounter for screening mammogram for malignant neoplasm of breast: Secondary | ICD-10-CM | POA: Insufficient documentation

## 2021-07-26 DIAGNOSIS — I739 Peripheral vascular disease, unspecified: Secondary | ICD-10-CM | POA: Insufficient documentation

## 2021-11-16 DIAGNOSIS — E7841 Elevated Lipoprotein(a): Secondary | ICD-10-CM | POA: Insufficient documentation

## 2021-11-16 IMAGING — MG MM DIGITAL DIAGNOSTIC UNILAT*L* W/ TOMO W/ CAD
4 series · 4 of 12 positions shown · non-contrast
Comparison: Previous exam(s).

CLINICAL DATA: 78-year-old female for further evaluation of
possible LEFT breast asymmetry on screening mammogram.

EXAM:
DIGITAL DIAGNOSTIC UNILATERAL LEFT MAMMOGRAM WITH CAD AND TOMO

[L MLO synth-2D]
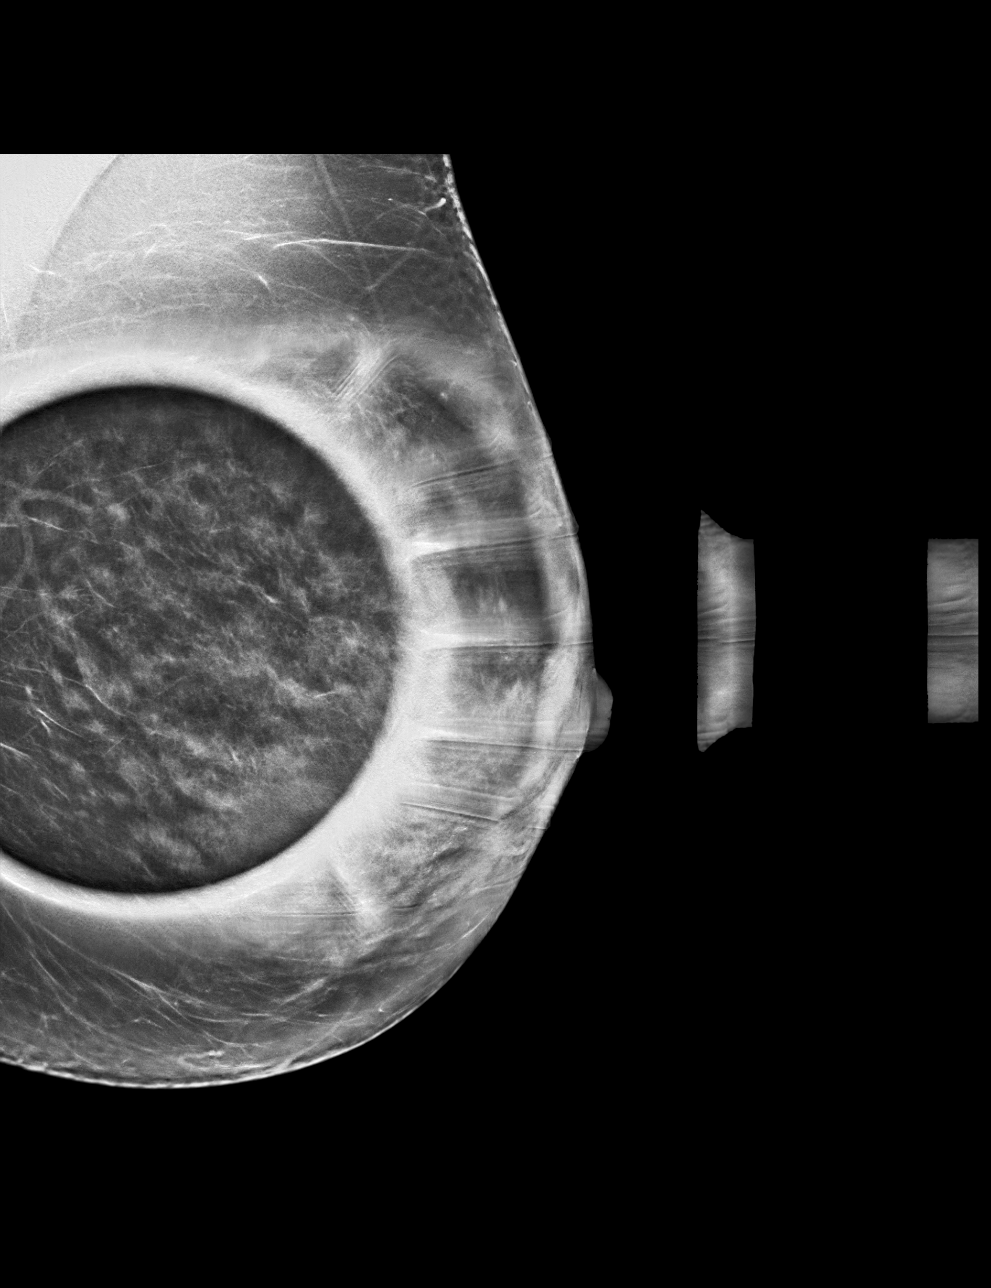

[L ML synth-2D]
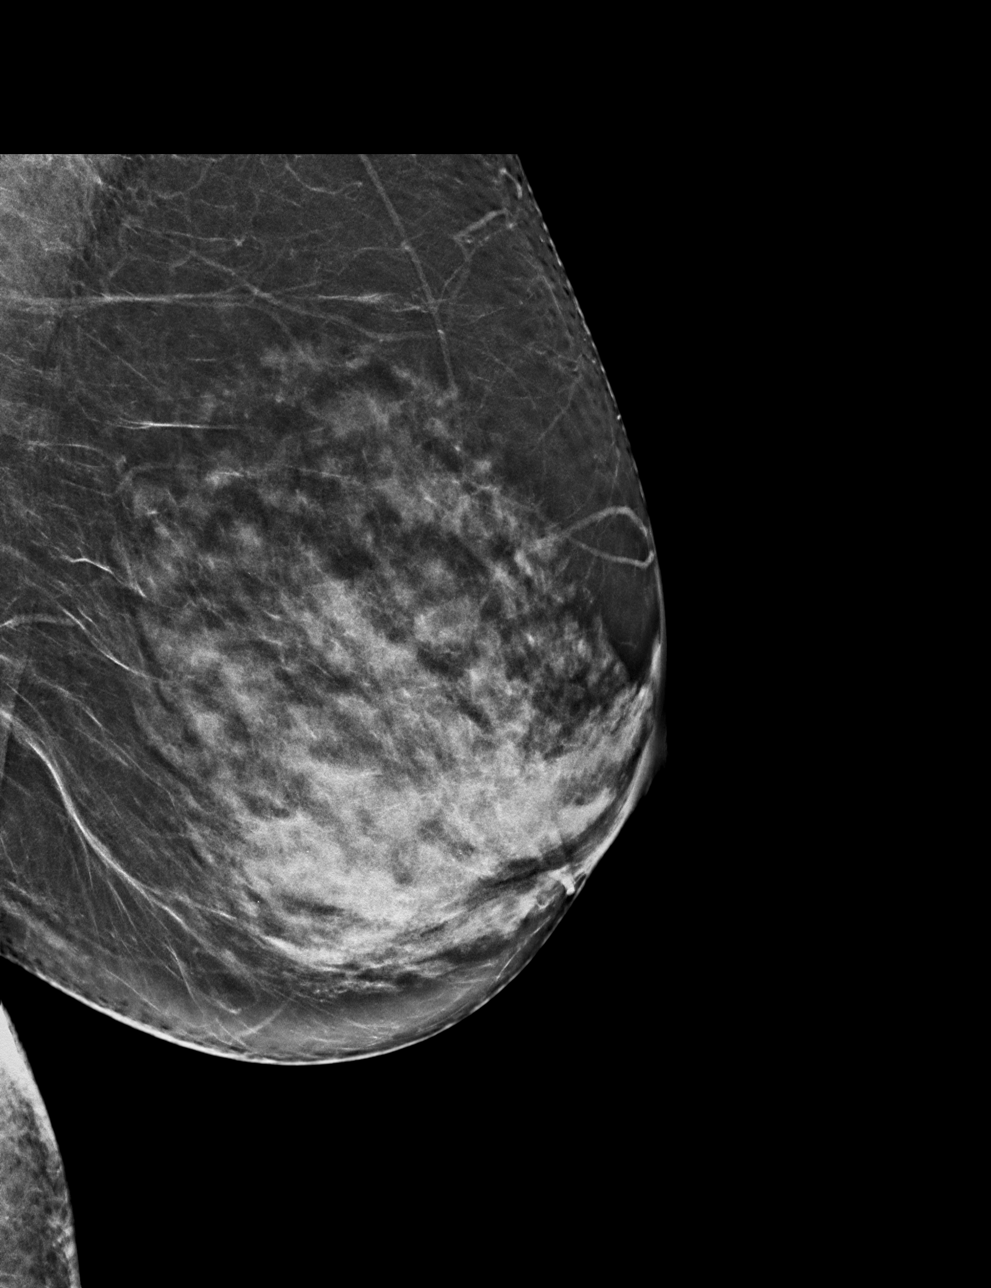

[L MLO tomo · tomo slice 31/60.0]
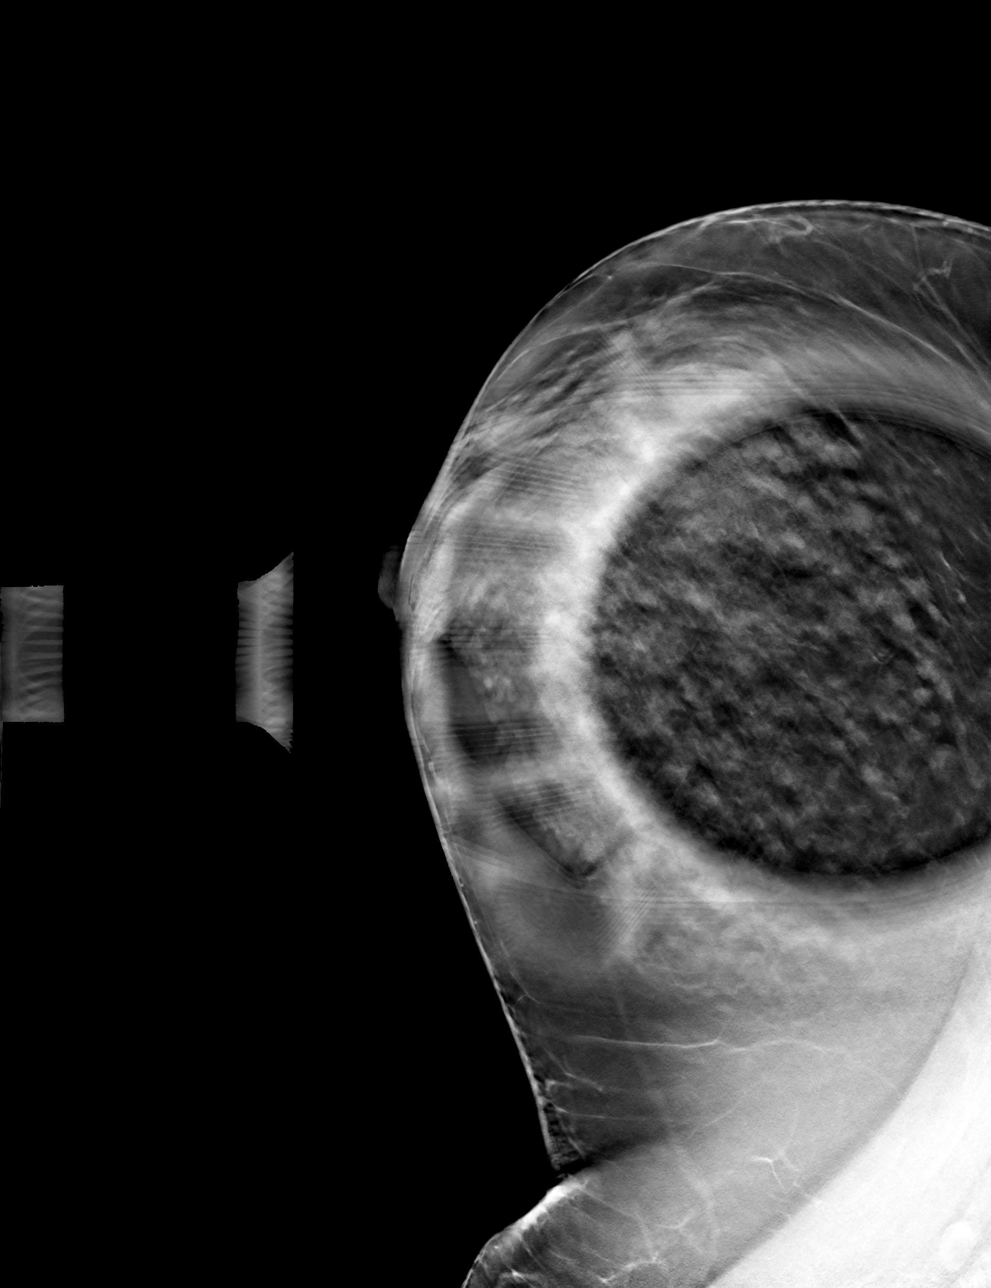

[L ML tomo · tomo slice 33/66.0]
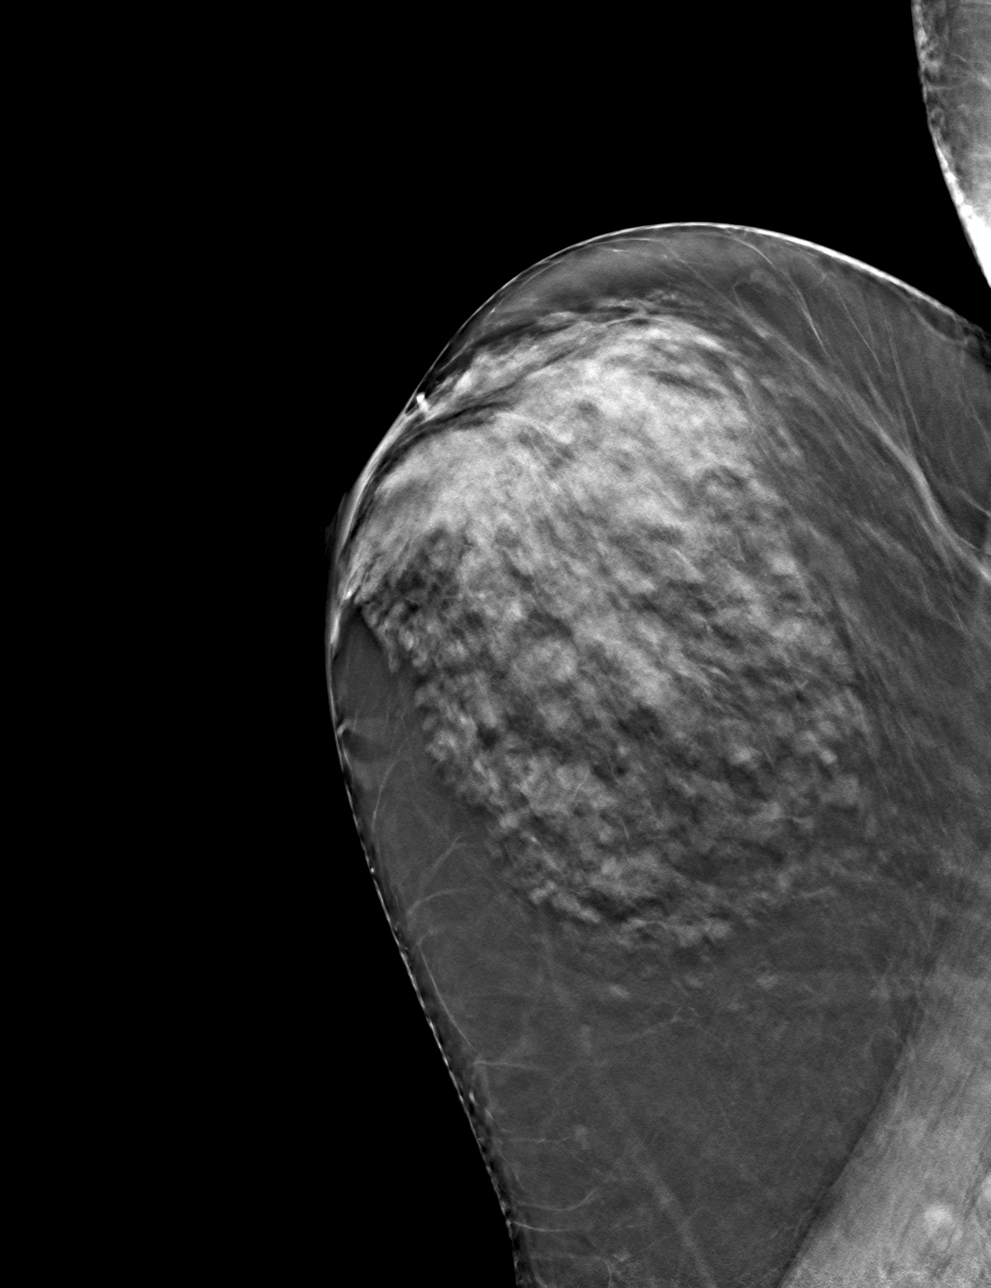

[4 of 12 positions shown; findings below may reference images not displayed]

ACR Breast Density Category c: The breast tissue is heterogeneously
dense, which may obscure small masses.
FINDINGS: 2D/3D full field and spot compression views of the LEFT breast
demonstrate dispersal of the screening study asymmetry without
persistent abnormality.

Mammographic images were processed with CAD.
IMPRESSION: No persistent abnormality at the site of the screening study
finding.

RECOMMENDATION:
Bilateral screening mammogram in 1 year.

I have discussed the findings and recommendations with the patient.
If applicable, a reminder letter will be sent to the patient
regarding the next appointment.

BI-RADS CATEGORY  1: Negative.

## 2021-11-28 ENCOUNTER — Encounter (INDEPENDENT_AMBULATORY_CARE_PROVIDER_SITE_OTHER): Payer: Self-pay

## 2021-11-28 ENCOUNTER — Encounter (INDEPENDENT_AMBULATORY_CARE_PROVIDER_SITE_OTHER): Payer: Self-pay | Admitting: Vascular Surgery

## 2022-01-02 ENCOUNTER — Encounter (INDEPENDENT_AMBULATORY_CARE_PROVIDER_SITE_OTHER): Payer: Self-pay

## 2022-02-03 ENCOUNTER — Other Ambulatory Visit: Payer: Self-pay

## 2022-02-03 ENCOUNTER — Other Ambulatory Visit: Payer: Self-pay | Admitting: Physical Medicine and Rehabilitation

## 2022-02-03 DIAGNOSIS — M5416 Radiculopathy, lumbar region: Secondary | ICD-10-CM

## 2022-02-06 ENCOUNTER — Ambulatory Visit
Admission: RE | Admit: 2022-02-06 | Discharge: 2022-02-06 | Disposition: A | Discharge status: Home / Self Care | Payer: Medicare Other | Source: Ambulatory Visit | Attending: Physical Medicine and Rehabilitation | Admitting: Physical Medicine and Rehabilitation

## 2022-02-06 DIAGNOSIS — M5416 Radiculopathy, lumbar region: Secondary | ICD-10-CM

## 2022-02-14 ENCOUNTER — Encounter: Payer: Self-pay | Admitting: Physical Therapy

## 2022-02-14 ENCOUNTER — Ambulatory Visit: Payer: Medicare Other | Attending: Obstetrics and Gynecology | Admitting: Physical Therapy

## 2022-02-14 DIAGNOSIS — R279 Unspecified lack of coordination: Secondary | ICD-10-CM | POA: Diagnosis present

## 2022-02-14 DIAGNOSIS — M533 Sacrococcygeal disorders, not elsewhere classified: Secondary | ICD-10-CM | POA: Diagnosis present

## 2022-02-14 DIAGNOSIS — R2689 Other abnormalities of gait and mobility: Secondary | ICD-10-CM | POA: Insufficient documentation

## 2022-02-14 NOTE — Therapy (Incomplete)
OUTPATIENT PHYSICAL THERAPY EVALUATION   Patient Name: Kristi Maldonado MRN: [id number] DOB:Jul 10, 1940, 82 y.o., female Today's Date: 02/15/2022   PT End of Session - 02/14/22 1429     Visit Number 1    Number of Visits 10    Date for PT Re-Evaluation 04/25/22   eval 02/14/22   PT Start Time 1417    PT Stop Time 1500    PT Time Calculation (min) 43 min             Past Medical History:  Diagnosis Date   Degenerative disc disease, lumbar    Difficult airway for intubation    Patient told by anesthesiology that she should tell future providers to always use a pediatric ET tube due to narrow airway and prior surgical issues including scarring   Difficult intubation    Narrow airway.  Needs pediatric tube   Hypertension    Hypothyroidism    Thyroid disease    Vertigo    x1   Wears dentures    partial lower   Past Surgical History:  Procedure Laterality Date   CATARACT EXTRACTION W/PHACO Left 01/20/2020   Procedure: CATARACT EXTRACTION PHACO AND INTRAOCULAR LENS PLACEMENT (IOC) LEFT 6.48 00:42.2;  Surgeon: Galen Manila, MD;  Location: [facility];  Service: Ophthalmology;  Laterality: Left;   CATARACT EXTRACTION W/PHACO Right 03/16/2020   Procedure: CATARACT EXTRACTION PHACO AND INTRAOCULAR LENS PLACEMENT (IOC) RIGHT;  Surgeon: Galen Manila, MD;  Location: [facility];  Service: Ophthalmology;  Laterality: Right;  6.11 0:43.5   SALIVARY GLAND SURGERY     THYROID SURGERY  2000   total vaginal hysterectomy, bilateral uterosacral ligament vaginal vault suspension, anterior colporrhaphy, perineorrhaphy, cystourethroscopy surgery on 08/11/2020  2022   TUBAL LIGATION     Patient Active Problem List   Diagnosis Date Noted   Allergic reaction 06/09/2017   DIVERTICULITIS, COLON 02/05/2009   IBS 02/05/2009    PCP: Bethann Punches MD   REFERRING PROVIDER: Doy Hutching MD   REFERRING DIAG: .3 (ICD-10-CM) - Stress incontinence (female) (female)   Rationale for  Evaluation and Treatment Rehabilitation  THERAPY DIAG:  Other abnormalities of gait and mobility  Unspecified lack of coordination  Sacrococcygeal disorders, not elsewhere classified  ONSET DATE:   SUBJECTIVE:                                                                                                                                                                                           SUBJECTIVE STATEMENT on EVAL 02/14/22 : 1) urinary urge incontinence: Pt is not able to make it to the bathroom in time when her bladder is full  and after drinking tea. These episodes occur 3 x a week when she first wakes up. Pt wears a pad at night when sleeping so she is prepared for the leakage in the morning.    2) CLBP : occurs standing at sink with food preparation and doing dishes and it radiates to the L side by ( pt points to iliac crest L).  8-9/10.  Eases with stopping her activity and resting with heating pad or lidocain patch. LBP also occurs with ending.    3) left neck pain started two weeks ago. It came on gradually . No radiating down the arm. Pt has been sitting at the computer.  Pt completed PT HEP for neck and low back customized for her scoliosis back in 2022.     PERTINENT HISTORY:  total vaginal hysterectomy, bilateral uterosacral ligament vaginal vault suspension, anterior colporrhaphy, perineorrhaphy, cystourethroscopy surgery on 06/08/202    claudication of BLE  ( L worse than R)  , LBP Pt jsut started a [facility] routine [facility].  Pt has attended a class in the pool. Pt plans to use recumbent bike. Pt walks on the cushioned trail.    PAIN:  Are you having pain? No    PRECAUTIONS: None  WEIGHT BEARING RESTRICTIONS: No lifting weight > 50 lb from Dr. Yves Dillhasnis who treats her for her LBP   FALLS:  Has patient fallen in last 6 months? No  LIVING ENVIRONMENT: Lives with:  lives alone Lives in: House/apartment Stairs: No Has following equipment at home:  None  OCCUPATION: retired   PLOF: Independent  PATIENT GOALS:   Get a workout routine that is safe for LBP and claudication of BLE and to make it to the toilet in time before leakage    OBJECTIVE:    [facility] PT Assessment - 02/14/22 1034       Sit to Stand   Comments poor alignment, genu valgus      Palpation   Spinal mobility R rotation ~30%, L 45%  ( non radiating pain)    SI assessment  R iliac crest , R shoulder higher             [facility] Adult PT Treatment/Exercise - 02/14/22 1034       Therapeutic Activites    Therapeutic Activities Other Therapeutic Activities    Other Therapeutic Activities doscussed goals, fitness goals, considerations for exericses with claudication BLE , hysterectomy. prolapse surgery      Neuro Re-ed    Neuro Re-ed Details  cued for proper alignment of knee and hip with sit to stand to minimize genu valgus                 HOME EXERCISE PROGRAM: See pt instruction section    ASSESSMENT:  CLINICAL IMPRESSION:   Pt is a  81  yo  who presents with  urge incontinence, non-radiating CLBP, and L neck pain  which impact QOL, ADL, fitness, and community activities. Pt underwent hysterectomy/ bladder suspension surgery 2/2 prolapse in 2022.   Pt's musculoskeletal assessment revealed scoliosis with uneven pelvic girdle and shoulder height, asymmetries to gait pattern, limited spinal /pelvic mobility, dyscoordination and strength of pelvic floor mm, hip weakness, poor body mechanics which places strain on the abdominal/pelvic floor mm. These are deficits that indicate an ineffective intraabdominal pressure system associated with increased risk for pt's Sx.    Pt will benefit from coordination training and education on fitness and functional positions in order to gain  a more effective intraabdominal pressure system to minimize urinary leakage. Pt just started going to the gym for aquatic classes and recumbent bike use for aerobic health and  management of CLBP and claudication of BLE.   Pt was provided education on etiology of Sx with anatomy, physiology explanation with images along with the benefits of customized pelvic PT Tx based on pt's medical conditions and musculoskeletal deficits.  Explained the physiology of deep core mm coordination and roles of pelvic floor function in urination, defecation, sexual function, and postural control with deep core mm system.    Regional interdependent approaches will yield greater benefits in pt's POC due to the complexity of pt's medical Hx and the significant impact their Sx have had on their QOL. P  Following Tx today which pt tolerated without complaints, pt demo'd proper technique for sit to stand to minimize straining pelvic area. Plan to address pt's scoliosis at next session and review deep core exercises and later progress to pelvic floor exercises.   Pt benefits from skilled PT.    OBJECTIVE IMPAIRMENTS decreased activity tolerance, decreased coordination, decreased endurance, decreased mobility, difficulty walking, decreased ROM, decreased strength, decreased safety awareness, hypomobility, increased muscle spasms, impaired flexibility, improper body mechanics, postural dysfunction, and pain. scar restrictions   ACTIVITY LIMITATIONS  self-care,  sleep, home chores, work tasks    PARTICIPATION LIMITATIONS:  community, gym activities    PERSONAL FACTORS  Prolapse surgery is also affecting patient's functional outcome.    REHAB POTENTIAL: Good   CLINICAL DECISION MAKING: Evolving/moderate complexity   EVALUATION COMPLEXITY: Moderate    PATIENT EDUCATION:    Education details: Showed pt anatomy images. Explained muscles attachments/ connection, physiology of deep core system/ spinal- thoracic-pelvis-lower kinetic chain as they relate to pt's presentation, Sx, and past Hx. Explained what and how these areas of deficits need to be restored to balance and function    See  Therapeutic activity / neuromuscular re-education section  Answered pt's questions.   Person educated: Patient Education method: Explanation, Demonstration, Tactile cues, Verbal cues, and Handouts Education comprehension: verbalized understanding, returned demonstration, verbal cues required, tactile cues required, and needs further education     PLAN: PT FREQUENCY: 1x/week   PT DURATION: 10 weeks   PLANNED INTERVENTIONS: Therapeutic exercises, Therapeutic activity, Neuromuscular re-education, Balance training, Gait training, Patient/Family education, Self Care, Joint mobilization, Spinal mobilization, Moist heat, Taping, and Manual therapy, dry needling.   PLAN FOR NEXT SESSION: See clinical impression for plan     GOALS: Goals reviewed with patient? Yes  SHORT TERM GOALS: Target date: 03/14/2022    Pt will demo IND with HEP                    Baseline: Not IND            Goal status: INITIAL   LONG TERM GOALS: Target date: 04/25/2022    1.Pt will demo proper deep core coordination without chest breathing and optimal excursion of diaphragm/pelvic floor in order to promote spinal stability and pelvic floor function  Baseline: dyscoordination Goal status: INITIAL  2.  Pt will demo > 5 pt change on FOTO  to improve QOL and function  PFDI Urinary baseline - 46   Urinary Problem baseline- 53  Lumber baseline  -  50   Goal status: INITIAL  3.  Pt will demo proper body mechanics in against gravity tasks and ADLs  work tasks, fitness  to minimize straining pelvic floor / back  Baseline: not IND, improper form that places strain on pelvic floor                Goal status: INITIAL    4. Pt is able to make it to the bathroom in time when her bladder is full and after drinking tea and experience decreased episodes of leakage before making it to the toilet from 3 x week to < 1 x week in order to improve QOL . Baseline: These episodes occur 3 x a week when she  first wakes up. Goal status: INITIAL    5. Pt will report being able to stand at sink with food preparation and doing dishes > 20 min and have no radiating pain  to the L side  to iliac crest L) in order to perform ADLs  Baseline: Pain 8-9/10 with these activities after 20 min.   Eases with stopping her activity and resting with heating pad or lidocain patch  Goal status: INITIAL   6. Pt will demo levelled pelvic girdle and shoulder height in order to progress to deep core strengthening HEP and restore mobility at spine, pelvis, gait, posture   Baseline: R iliac crest , R shoulder higher  Goal status: INITIAL    Mariane Masters, PT 02/15/2022, 10:37 AM

## 2022-02-15 NOTE — Patient Instructions (Signed)
  Proper body mechanics with getting out of a chair to decrease strain  on back &pelvic floor   Avoid holding your breath when Getting out of the chair:  Scoot to front part of chair chair Heels behind knees, feet are hip width apart, nose over toes  Inhale like you are smelling roses Exhale to stand    

## 2022-02-21 ENCOUNTER — Ambulatory Visit: Payer: Medicare Other | Admitting: Physical Therapy

## 2022-02-28 ENCOUNTER — Ambulatory Visit: Payer: Medicare Other | Admitting: Physical Therapy

## 2022-03-07 ENCOUNTER — Ambulatory Visit: Payer: Medicare Other | Attending: Obstetrics and Gynecology | Admitting: Physical Therapy

## 2022-03-07 DIAGNOSIS — M533 Sacrococcygeal disorders, not elsewhere classified: Secondary | ICD-10-CM | POA: Insufficient documentation

## 2022-03-07 DIAGNOSIS — R2689 Other abnormalities of gait and mobility: Secondary | ICD-10-CM | POA: Diagnosis present

## 2022-03-07 DIAGNOSIS — R279 Unspecified lack of coordination: Secondary | ICD-10-CM | POA: Diagnosis present

## 2022-03-07 DIAGNOSIS — G8929 Other chronic pain: Secondary | ICD-10-CM | POA: Insufficient documentation

## 2022-03-07 DIAGNOSIS — R278 Other lack of coordination: Secondary | ICD-10-CM | POA: Insufficient documentation

## 2022-03-07 DIAGNOSIS — M5442 Lumbago with sciatica, left side: Secondary | ICD-10-CM | POA: Insufficient documentation

## 2022-03-07 DIAGNOSIS — M62838 Other muscle spasm: Secondary | ICD-10-CM | POA: Diagnosis present

## 2022-03-07 NOTE — Patient Instructions (Signed)
  Lengthen Back rib by L  shoulder ( winging)    Lie on R  side ,   pillow between knees and under head  Pull  L arm overhead over mattress, grab the edge of mattress,pull it upward, drawing elbow away from ears  Breathing 10 reps  Brushing arm with 3/4 turn onto pillow behind back  Lying on R  side ,Pillow/ Block between knees     dragging top forearm across ribs below breast rotating 3/4 turn,  rotating  _L_ only this week ,  relax onto the pillow behind the back  and then back to other palm , maintain top palm on body whole top and not lift shoulder  ___     Clam Shell 45 Degrees  L sidelying   Lying with hips and knees bent 45, one pillow between knees and ankles. Heel together, toes apart like ballerina,  Lift knee with exhale while pressing heels together. Be sure pelvis does not roll backward. Do not arch back. Do 20 times,  R knee   2 times per day.    Complimentary stretch: Figure-4   3 breaths

## 2022-03-07 NOTE — Therapy (Signed)
OUTPATIENT PHYSICAL THERAPY Treatment    Patient Name: Kristi Maldonado MRN: [id number] DOB:Dec 01, 1940, 82 y.o., female Today's Date: 03/07/2022   PT End of Session - 03/07/22 1508     Visit Number 2    Number of Visits 10    Date for PT Re-Evaluation 04/25/22   eval 02/14/22   PT Start Time 1505    PT Stop Time 1550    PT Time Calculation (min) 45 min             Past Medical History:  Diagnosis Date   Degenerative disc disease, lumbar    Difficult airway for intubation    Patient told by anesthesiology that she should tell future providers to always use a pediatric ET tube due to narrow airway and prior surgical issues including scarring   Difficult intubation    Narrow airway.  Needs pediatric tube   Hypertension    Hypothyroidism    Thyroid disease    Vertigo    x1   Wears dentures    partial lower   Past Surgical History:  Procedure Laterality Date   CATARACT EXTRACTION W/PHACO Left 01/20/2020   Procedure: CATARACT EXTRACTION PHACO AND INTRAOCULAR LENS PLACEMENT (IOC) LEFT 6.48 00:42.2;  Surgeon: Birder Robson, MD;  Location: [facility];  Service: Ophthalmology;  Laterality: Left;   CATARACT EXTRACTION W/PHACO Right 03/16/2020   Procedure: CATARACT EXTRACTION PHACO AND INTRAOCULAR LENS PLACEMENT ([place]) RIGHT;  Surgeon: Birder Robson, MD;  Location: [facility];  Service: Ophthalmology;  Laterality: Right;  6.11 0:43.5   SALIVARY GLAND SURGERY     THYROID SURGERY  2000   total vaginal hysterectomy, bilateral uterosacral ligament vaginal vault suspension, anterior colporrhaphy, perineorrhaphy, cystourethroscopy surgery on 08/11/2020  2022   TUBAL LIGATION     Patient Active Problem List   Diagnosis Date Noted   Allergic reaction 06/09/2017   DIVERTICULITIS, COLON 02/05/2009   IBS 02/05/2009    PCP: Emily Filbert MD   REFERRING PROVIDER: Sharlett Iles MD   REFERRING DIAG: .3 (ICD-10-CM) - Stress incontinence (female) (female)   Rationale for  Evaluation and Treatment Rehabilitation  THERAPY DIAG:  Other abnormalities of gait and mobility  Unspecified lack of coordination  Sacrococcygeal disorders, not elsewhere classified  Other lack of coordination  Other muscle spasm  Chronic low back pain with left-sided sciatica, unspecified back pain laterality  ONSET DATE:   SUBJECTIVE:            SUBJECTIVE STATEMENT Today:  Pt has been walking. [facility] .  SUBJECTIVE STATEMENT on EVAL 02/14/22 : 1) urinary urge incontinence: Pt is not able to make it to the bathroom in time when her bladder is full and after drinking tea. These episodes occur 3 x a week when she first wakes up. Pt wears a pad at night when sleeping so she is prepared for the leakage in the morning.    2) CLBP : occurs standing at sink with food preparation and doing dishes and it radiates to the L side by ( pt points to iliac crest L).  8-9/10.  Eases with stopping her activity and resting with heating pad or lidocain patch. LBP also occurs with ending.    3) left neck pain started two weeks ago. It came on gradually . No radiating down the arm. Pt has been sitting at the computer.  Pt completed PT HEP for neck and low back customized for her scoliosis back in 2022.     PERTINENT HISTORY:  total vaginal hysterectomy, bilateral uterosacral ligament vaginal vault suspension, anterior colporrhaphy, perineorrhaphy, cystourethroscopy surgery on 06/08/202    claudication of BLE  ( L worse than R)  , LBP Pt jsut started a workout routine at the [facility] last week.  Pt has attended a class in the pool. Pt plans to use recumbent bike. Pt walks on the cushioned trail.    PAIN:  Are you having pain? No    PRECAUTIONS: None  WEIGHT BEARING RESTRICTIONS: No  lifting weight > 50 lb from Dr. Yves Dill who treats her for her LBP   FALLS:  Has patient fallen in last 6 months? No  LIVING ENVIRONMENT: Lives with:  lives alone Lives in: House/apartment Stairs: No Has following equipment at home: None  OCCUPATION: retired   PLOF: Independent  PATIENT GOALS:   Get a workout routine that is safe for LBP and claudication of BLE and to make it to the toilet in time before leakage    OBJECTIVE:     [facility] PT Assessment - 03/07/22 1524       Strength   Overall Strength Comments R hip abd / ext 3/5 , L hip abd 4-/5      Palpation   SI assessment  L shoulder , R iliac crest lowered ( post Tx: pelvic girdle levelled),    Palpation comment tightness/. tenderness at L medial scapula, interspinals, intercostals T3-10             [facility] Adult PT Treatment/Exercise - 03/07/22 1618       Neuro Re-ed    Neuro Re-ed Details  cued for  scoliosis specific stretches, R hip abduction strenghtening and L UE / thoracic mobility ( see pt instructions)      Modalities   Modalities Moist Heat      Moist Heat Therapy   Number Minutes Moist Heat 5 Minutes    Moist Heat Location --   thoracic, supine reclined twist ( unbilled)     Manual Therapy   Manual therapy comments STM/MWM at problem areas noted in assessment to minimzie tight L shoulder/ thoracic                 HOME EXERCISE PROGRAM: See pt instruction section    ASSESSMENT:  CLINICAL IMPRESSION:   Following Tx today which pt tolerated without complaints, pt demo'd levelled pelvic girdle.  Plan to continue address pt's scoliosis specific HEP to minimize convex curve at upper thoracic L and begin deep core exercises at next session.  Added R hip abduction  strengthening due to one sided weakness. Pelvic floor program will be more effective once pt's pelvic girdle and shoulders are levelled and spinal alignment is achieved along with stronger hip abduction strength.  Internal pelvic  floor assessment was deferred to  next session per pt's request.    Pt benefits from skilled PT.    OBJECTIVE IMPAIRMENTS decreased activity tolerance, decreased coordination, decreased endurance, decreased mobility, difficulty walking, decreased ROM, decreased strength, decreased safety awareness, hypomobility, increased muscle spasms, impaired flexibility, improper body mechanics, postural dysfunction, and pain. scar restrictions   ACTIVITY LIMITATIONS  self-care,  sleep, home chores, work tasks    PARTICIPATION LIMITATIONS:  community, gym activities    PERSONAL FACTORS  Prolapse surgery is also affecting patient's functional outcome.    REHAB POTENTIAL: Good   CLINICAL DECISION MAKING: Evolving/moderate complexity   EVALUATION COMPLEXITY: Moderate    PATIENT EDUCATION:    Education details: Showed pt anatomy images. Explained muscles attachments/ connection, physiology of deep core system/ spinal- thoracic-pelvis-lower kinetic chain as they relate to pt's presentation, Sx, and past Hx. Explained what and how these areas of deficits need to be restored to balance and function    See Therapeutic activity / neuromuscular re-education section  Answered pt's questions.   Person educated: Patient Education method: Explanation, Demonstration, Tactile cues, Verbal cues, and Handouts Education comprehension: verbalized understanding, returned demonstration, verbal cues required, tactile cues required, and needs further education     PLAN: PT FREQUENCY: 1x/week   PT DURATION: 10 weeks   PLANNED INTERVENTIONS: Therapeutic exercises, Therapeutic activity, Neuromuscular re-education, Balance training, Gait training, Patient/Family education, Self Care, Joint mobilization, Spinal mobilization, Moist heat, Taping, and Manual therapy, dry needling.   PLAN FOR NEXT SESSION: See clinical impression for plan     GOALS: Goals reviewed with patient? Yes  SHORT TERM GOALS: Target date:  03/14/2022    Pt will demo IND with HEP                    Baseline: Not IND            Goal status: INITIAL   LONG TERM GOALS: Target date: 04/25/2022    1.Pt will demo proper deep core coordination without chest breathing and optimal excursion of diaphragm/pelvic floor in order to promote spinal stability and pelvic floor function  Baseline: dyscoordination Goal status: INITIAL  2.  Pt will demo > 5 pt change on FOTO  to improve QOL and function  PFDI Urinary baseline - 46   Urinary Problem baseline- 53  Lumber baseline  -  50   Goal status: INITIAL  3.  Pt will demo proper body mechanics in against gravity tasks and ADLs  work tasks, fitness  to minimize straining pelvic floor / back                  Baseline: not IND, improper form that places strain on pelvic floor                Goal status: INITIAL    4. Pt is able to make it to the bathroom in time when her bladder is full and after drinking tea and experience decreased episodes of leakage before making it to the toilet from 3 x week to < 1 x week in order to improve QOL . Baseline: These episodes occur 3 x a week when she first wakes up. Goal status: INITIAL    5. Pt will report being able to stand at sink  with food preparation and doing dishes > 20 min and have no radiating pain  to the L side  to iliac crest L) in order to perform ADLs  Baseline: Pain 8-9/10 with these activities after 20 min.   Eases with stopping her activity and resting with heating pad or lidocain patch  Goal status: INITIAL   6. Pt will demo levelled pelvic girdle and shoulder height in order to progress to deep core strengthening HEP and restore mobility at spine, pelvis, gait, posture   Baseline: R iliac crest , R shoulder higher  Goal status: INITIAL    Jerl Mina, PT 03/07/2022, 4:26 PM

## 2022-03-14 ENCOUNTER — Ambulatory Visit: Payer: Medicare Other | Admitting: Physical Therapy

## 2022-03-14 DIAGNOSIS — M533 Sacrococcygeal disorders, not elsewhere classified: Secondary | ICD-10-CM

## 2022-03-14 DIAGNOSIS — R279 Unspecified lack of coordination: Secondary | ICD-10-CM

## 2022-03-14 DIAGNOSIS — M62838 Other muscle spasm: Secondary | ICD-10-CM

## 2022-03-14 DIAGNOSIS — R2689 Other abnormalities of gait and mobility: Secondary | ICD-10-CM | POA: Diagnosis not present

## 2022-03-14 DIAGNOSIS — R278 Other lack of coordination: Secondary | ICD-10-CM

## 2022-03-14 DIAGNOSIS — G8929 Other chronic pain: Secondary | ICD-10-CM

## 2022-03-14 NOTE — Therapy (Signed)
OUTPATIENT PHYSICAL THERAPY Treatment    Patient Name: Kristi Maldonado MRN: [id number] DOB:[birth year], 82 y.o., female Today's Date: 03/14/2022   PT End of Session - 03/14/22 1029     Visit Number 3    Number of Visits 10    Date for PT Re-Evaluation 04/25/22   eval 02/14/22   PT Start Time 0940    PT Stop Time 1025    PT Time Calculation (min) 45 min    Activity Tolerance Patient tolerated treatment well;No increased pain    Behavior During Therapy WFL for tasks assessed/performed             Past Medical History:  Diagnosis Date   Degenerative disc disease, lumbar    Difficult airway for intubation    Patient told by anesthesiology that she should tell future providers to always use a pediatric ET tube due to narrow airway and prior surgical issues including scarring   Difficult intubation    Narrow airway.  Needs pediatric tube   Hypertension    Hypothyroidism    Thyroid disease    Vertigo    x1   Wears dentures    partial lower   Past Surgical History:  Procedure Laterality Date   CATARACT EXTRACTION W/PHACO Left 01/20/2020   Procedure: CATARACT EXTRACTION PHACO AND INTRAOCULAR LENS PLACEMENT (IOC) LEFT 6.48 00:42.2;  Surgeon: Galen Manila, MD;  Location: [facility];  Service: Ophthalmology;  Laterality: Left;   CATARACT EXTRACTION W/PHACO Right 03/16/2020   Procedure: CATARACT EXTRACTION PHACO AND INTRAOCULAR LENS PLACEMENT (IOC) RIGHT;  Surgeon: Galen Manila, MD;  Location: [facility];  Service: Ophthalmology;  Laterality: Right;  6.11 0:43.5   SALIVARY GLAND SURGERY     THYROID SURGERY  2000   total vaginal hysterectomy, bilateral uterosacral ligament vaginal vault suspension, anterior colporrhaphy, perineorrhaphy, cystourethroscopy surgery on 08/11/2020  2022   TUBAL LIGATION     Patient Active Problem List   Diagnosis Date Noted   Allergic reaction 06/09/2017   DIVERTICULITIS, COLON 02/05/2009   IBS 02/05/2009    PCP: Bethann Punches MD   REFERRING PROVIDER: Doy Hutching MD   REFERRING DIAG: .3 (ICD-10-CM) - Stress incontinence (female) (female)   Rationale for Evaluation and Treatment Rehabilitation  THERAPY DIAG:  Other abnormalities of gait and mobility  Unspecified lack of coordination  Sacrococcygeal disorders, not elsewhere classified  Other lack of coordination  Other muscle spasm  Chronic low back pain with left-sided sciatica, unspecified back pain laterality  ONSET DATE:   SUBJECTIVE:            SUBJECTIVE STATEMENT Today:  Pt felt her midback and L shoulder feeling better after last session.  SUBJECTIVE STATEMENT on EVAL 02/14/22 : 1) urinary urge incontinence: Pt is not able to make it to the bathroom in time when her bladder is full and after drinking tea. These episodes occur 3 x a week when she first wakes up. Pt wears a pad at night when sleeping so she is prepared for the leakage in the morning.    2) CLBP : occurs standing at sink with food preparation and doing dishes and it radiates to the L side by ( pt points to iliac crest L).  8-9/10.  Eases with stopping her activity and resting with heating pad or lidocain patch. LBP also occurs with ending.    3) left neck pain started two weeks ago. It came on gradually . No radiating down the arm. Pt has been sitting at the computer.  Pt completed PT HEP for neck and low back customized for her scoliosis back in 2022.     PERTINENT HISTORY:  total vaginal hysterectomy, bilateral uterosacral ligament vaginal vault suspension, anterior colporrhaphy, perineorrhaphy, cystourethroscopy surgery on 06/08/202    claudication of BLE  ( L worse than R)  , LBP Pt jsut started a workout routine at the [facility] last week.  Pt has attended a class in the pool. Pt plans to use recumbent  bike. Pt walks on the cushioned trail.    PAIN:  Are you having pain? No    PRECAUTIONS: None  WEIGHT BEARING RESTRICTIONS: No lifting weight > 50 lb from Dr. Yves Dill who treats her for her LBP   FALLS:  Has patient fallen in last 6 months? No  LIVING ENVIRONMENT: Lives with:  lives alone Lives in: House/apartment Stairs: No Has following equipment at home: None  OCCUPATION: retired   PLOF: Independent  PATIENT GOALS:   Get a workout routine that is safe for LBP and claudication of BLE and to make it to the toilet in time before leakage    OBJECTIVE:    [facility] PT Assessment - 03/14/22 1030       Palpation   Spinal mobility hypomobile C/T junction deviation to L    SI assessment  shoulder and girdle more levelled but R shoulder lowered, protracted shoulders ,    Palpation comment tightness and tenderness along R medial scapula, R SCM, occipat al mm ,                 HOME EXERCISE PROGRAM: See pt instruction section    ASSESSMENT:  CLINICAL IMPRESSION:   Following Tx today which pt tolerated without complaints, pt demo'd decreased tightness along R thoracic region and improved mobility at C/T junction.  Continued to address pt's scoliosis specific HEP to minimize convex curve at upper thoracic and begin deep core exercises at next session.  Pelvic floor program will be more effective once pt's pelvic girdle and shoulders are levelled and spinal alignment is achieved along with stronger hip abduction strength.  Pt will continue to require more cervical retraction propioception trianing.  Internal pelvic floor assessment planned for next session.    Pt benefits from skilled PT.    OBJECTIVE IMPAIRMENTS decreased activity tolerance, decreased coordination, decreased endurance, decreased mobility, difficulty walking, decreased ROM, decreased strength, decreased safety awareness, hypomobility, increased muscle spasms, impaired flexibility, improper body mechanics,  postural dysfunction, and pain. scar restrictions   ACTIVITY LIMITATIONS  self-care,  sleep, home chores, work tasks    PARTICIPATION LIMITATIONS:  community, gym activities    PERSONAL FACTORS  Prolapse surgery is also affecting patient's  functional outcome.    REHAB POTENTIAL: Good   CLINICAL DECISION MAKING: Evolving/moderate complexity   EVALUATION COMPLEXITY: Moderate    PATIENT EDUCATION:    Education details: Showed pt anatomy images. Explained muscles attachments/ connection, physiology of deep core system/ spinal- thoracic-pelvis-lower kinetic chain as they relate to pt's presentation, Sx, and past Hx. Explained what and how these areas of deficits need to be restored to balance and function    See Therapeutic activity / neuromuscular re-education section  Answered pt's questions.   Person educated: Patient Education method: Explanation, Demonstration, Tactile cues, Verbal cues, and Handouts Education comprehension: verbalized understanding, returned demonstration, verbal cues required, tactile cues required, and needs further education     PLAN: PT FREQUENCY: 1x/week   PT DURATION: 10 weeks   PLANNED INTERVENTIONS: Therapeutic exercises, Therapeutic activity, Neuromuscular re-education, Balance training, Gait training, Patient/Family education, Self Care, Joint mobilization, Spinal mobilization, Moist heat, Taping, and Manual therapy, dry needling.   PLAN FOR NEXT SESSION: See clinical impression for plan     GOALS: Goals reviewed with patient? Yes  SHORT TERM GOALS: Target date: 03/14/2022    Pt will demo IND with HEP                    Baseline: Not IND            Goal status: INITIAL   LONG TERM GOALS: Target date: 04/25/2022    1.Pt will demo proper deep core coordination without chest breathing and optimal excursion of diaphragm/pelvic floor in order to promote spinal stability and pelvic floor function  Baseline: dyscoordination Goal status:  INITIAL  2.  Pt will demo > 5 pt change on FOTO  to improve QOL and function  PFDI Urinary baseline - 46   Urinary Problem baseline- 53  Lumber baseline  -  50   Goal status: INITIAL  3.  Pt will demo proper body mechanics in against gravity tasks and ADLs  work tasks, fitness  to minimize straining pelvic floor / back                  Baseline: not IND, improper form that places strain on pelvic floor                Goal status: INITIAL    4. Pt is able to make it to the bathroom in time when her bladder is full and after drinking tea and experience decreased episodes of leakage before making it to the toilet from 3 x week to < 1 x week in order to improve QOL . Baseline: These episodes occur 3 x a week when she first wakes up. Goal status: INITIAL    5. Pt will report being able to stand at sink with food preparation and doing dishes > 20 min and have no radiating pain  to the L side  to iliac crest L) in order to perform ADLs  Baseline: Pain 8-9/10 with these activities after 20 min.   Eases with stopping her activity and resting with heating pad or lidocain patch  Goal status: INITIAL   6. Pt will demo levelled pelvic girdle and shoulder height in order to progress to deep core strengthening HEP and restore mobility at spine, pelvis, gait, posture   Baseline: R iliac crest , R shoulder higher  Goal status: INITIAL    Jerl Mina, PT 03/14/2022, 10:36 AM

## 2022-03-21 ENCOUNTER — Ambulatory Visit: Payer: Medicare Other | Admitting: Physical Therapy

## 2022-03-24 ENCOUNTER — Ambulatory Visit: Payer: Medicare Other | Admitting: Physical Therapy

## 2022-03-24 DIAGNOSIS — R2689 Other abnormalities of gait and mobility: Secondary | ICD-10-CM

## 2022-03-24 DIAGNOSIS — R279 Unspecified lack of coordination: Secondary | ICD-10-CM

## 2022-03-24 DIAGNOSIS — M533 Sacrococcygeal disorders, not elsewhere classified: Secondary | ICD-10-CM

## 2022-03-24 DIAGNOSIS — M62838 Other muscle spasm: Secondary | ICD-10-CM

## 2022-03-24 DIAGNOSIS — G8929 Other chronic pain: Secondary | ICD-10-CM

## 2022-03-24 DIAGNOSIS — R278 Other lack of coordination: Secondary | ICD-10-CM

## 2022-03-24 NOTE — Patient Instructions (Signed)
Sitting to midline, not leaning   _______________________________________  Wall stretches  L hand up and leaning to L    R hand swiping from noon to 3 o'clock

## 2022-03-24 NOTE — Therapy (Signed)
OUTPATIENT PHYSICAL THERAPY Treatment    Patient Name: Kristi Maldonado MRN: [id number] DOB:1941/02/17, 82 y.o., female Today's Date: 03/24/2022   PT End of Session - 03/24/22 0945     Visit Number 4    Number of Visits 10    Date for PT Re-Evaluation 04/25/22   eval 02/14/22   PT Start Time 0940    PT Stop Time 1020    PT Time Calculation (min) 40 min    Activity Tolerance Patient tolerated treatment well;No increased pain    Behavior During Therapy WFL for tasks assessed/performed             Past Medical History:  Diagnosis Date   Degenerative disc disease, lumbar    Difficult airway for intubation    Patient told by anesthesiology that she should tell future providers to always use a pediatric ET tube due to narrow airway and prior surgical issues including scarring   Difficult intubation    Narrow airway.  Needs pediatric tube   Hypertension    Hypothyroidism    Thyroid disease    Vertigo    x1   Wears dentures    partial lower   Past Surgical History:  Procedure Laterality Date   CATARACT EXTRACTION W/PHACO Left 01/20/2020   Procedure: CATARACT EXTRACTION PHACO AND INTRAOCULAR LENS PLACEMENT (IOC) LEFT 6.48 00:42.2;  Surgeon: Birder Robson, MD;  Location: [place];  Service: Ophthalmology;  Laterality: Left;   CATARACT EXTRACTION W/PHACO Right 03/16/2020   Procedure: CATARACT EXTRACTION PHACO AND INTRAOCULAR LENS PLACEMENT ([place]) RIGHT;  Surgeon: Birder Robson, MD;  Location: [place];  Service: Ophthalmology;  Laterality: Right;  6.11 0:43.5   SALIVARY GLAND SURGERY     THYROID SURGERY  2000   total vaginal hysterectomy, bilateral uterosacral ligament vaginal vault suspension, anterior colporrhaphy, perineorrhaphy, cystourethroscopy surgery on 08/11/2020  2022   TUBAL LIGATION     Patient Active Problem List   Diagnosis Date Noted   Allergic reaction 06/09/2017   DIVERTICULITIS, COLON 02/05/2009   IBS 02/05/2009    PCP: Emily Filbert MD   REFERRING PROVIDER: Sharlett Iles MD   REFERRING DIAG: .3 (ICD-10-CM) - Stress incontinence (female) (female)   Rationale for Evaluation and Treatment Rehabilitation  THERAPY DIAG:  Other abnormalities of gait and mobility  Unspecified lack of coordination  Sacrococcygeal disorders, not elsewhere classified  Other lack of coordination  Other muscle spasm  Chronic low back pain with left-sided sciatica, unspecified back pain laterality  ONSET DATE:   SUBJECTIVE:            SUBJECTIVE STATEMENT Today:  Pt noticed her LBP is better. There is pain  R midback by R shoulder blade when she is doing dishes. Pt was not able to do her HEP last week due to being sick.  Pt feels more confident and not fear of doing exercises due to pain nor prolapse situaiton  SUBJECTIVE STATEMENT on EVAL 02/14/22 : 1) urinary urge incontinence: Pt is not able to make it to the bathroom in time when her bladder is full and after drinking tea. These episodes occur 3 x a week when she first wakes up. Pt wears a pad at night when sleeping so she is prepared for the leakage in the morning.    2) CLBP : occurs standing at sink with food preparation and doing dishes and it radiates to the L side by ( pt points to iliac crest L).  8-9/10.  Eases with stopping her activity and resting with heating pad or lidocain patch. LBP also occurs with ending.    3) left neck pain started two weeks ago. It came on gradually . No radiating down the arm. Pt has been sitting at the computer.  Pt completed PT HEP for neck and low back customized for her scoliosis back in 2022.     PERTINENT HISTORY:  total vaginal hysterectomy, bilateral uterosacral ligament vaginal vault suspension, anterior colporrhaphy, perineorrhaphy, cystourethroscopy surgery on  06/08/202    claudication of BLE  ( L worse than R)  , LBP Pt jsut started a workout routine at the [facility] last week.  Pt has attended a class in the pool. Pt plans to use recumbent bike. Pt walks on the cushioned trail.    PAIN:  Are you having pain? No    PRECAUTIONS: None  WEIGHT BEARING RESTRICTIONS: No lifting weight > 50 lb from Dr. Sharlet Salina who treats her for her LBP   FALLS:  Has patient fallen in last 6 months? No  LIVING ENVIRONMENT: Lives with:  lives alone Lives in: House/apartment Stairs: No Has following equipment at home: None  OCCUPATION: retired   PLOF: Independent  PATIENT GOALS:   Get a workout routine that is safe for LBP and claudication of BLE and to make it to the toilet in time before leakage    OBJECTIVE:     [facility] PT Assessment - 03/24/22 0950       Observation/Other Assessments   Observations discussed sitting positions at [employer] and computer:  pt simulated positoin which showed L trunk lean with Lelbow propped on pillow and armrest in both settings      Sit to Stand   Comments did not scoot to front of seat and demo'd adducted knees      Palpation   SI assessment  R shoulder lowered/ R lower ribs tightness/ thorax shifted to L      Ambulation/Gait   Gait Comments 1.24 m/s  , L thoracic shift , limited arm swings             [facility] Adult PT Treatment/Exercise - 03/24/22 0950       Therapeutic Activites    Other Therapeutic Activities discussed sitting position on recliner and computer to minimize scoliosis      Neuro Re-ed    Neuro Re-ed Details  cued for scooting to front of seat,      Exercises   Other Exercises  two exercises by wall to lengthen flank ( see pt instructions)      Modalities   Modalities Moist Heat      Moist Heat Therapy   Number Minutes Moist Heat 5 Minutes    Moist Heat Location --   thoracic (unbilled), supine reclined twist     Manual Therapy   Manual therapy comments STM/MWM along R  paraspinals, intercostals to lengthen R flank to adjust for scoliosis  HOME EXERCISE PROGRAM: See pt instruction section    ASSESSMENT:  CLINICAL IMPRESSION:   Following Tx today which pt tolerated without complaints, pt demo'd decreased tightness along R thoracic region and demo'd more levelled shoulders.  Pt showed increased arm swing and trunk rotation in gait. Anticiapte today's Tx will help minimize midback pain when dishes. LBP is better across the past sessions.   Pelvic floor program will be more effective once pt's pelvic girdle and shoulders are levelled and spinal alignment is achieved along with stronger hip abduction strength.  Pt will continue to require more cervical retraction propioception training.  Internal pelvic floor assessment planned for next session.    Pt benefits from skilled PT.    OBJECTIVE IMPAIRMENTS decreased activity tolerance, decreased coordination, decreased endurance, decreased mobility, difficulty walking, decreased ROM, decreased strength, decreased safety awareness, hypomobility, increased muscle spasms, impaired flexibility, improper body mechanics, postural dysfunction, and pain. scar restrictions   ACTIVITY LIMITATIONS  self-care,  sleep, home chores, work tasks    PARTICIPATION LIMITATIONS:  community, gym activities    PERSONAL FACTORS  Prolapse surgery is also affecting patient's functional outcome.    REHAB POTENTIAL: Good   CLINICAL DECISION MAKING: Evolving/moderate complexity   EVALUATION COMPLEXITY: Moderate    PATIENT EDUCATION:    Education details: Showed pt anatomy images. Explained muscles attachments/ connection, physiology of deep core system/ spinal- thoracic-pelvis-lower kinetic chain as they relate to pt's presentation, Sx, and past Hx. Explained what and how these areas of deficits need to be restored to balance and function    See Therapeutic activity / neuromuscular re-education  section  Answered pt's questions.   Person educated: Patient Education method: Explanation, Demonstration, Tactile cues, Verbal cues, and Handouts Education comprehension: verbalized understanding, returned demonstration, verbal cues required, tactile cues required, and needs further education     PLAN: PT FREQUENCY: 1x/week   PT DURATION: 10 weeks   PLANNED INTERVENTIONS: Therapeutic exercises, Therapeutic activity, Neuromuscular re-education, Balance training, Gait training, Patient/Family education, Self Care, Joint mobilization, Spinal mobilization, Moist heat, Taping, and Manual therapy, dry needling.   PLAN FOR NEXT SESSION: See clinical impression for plan     GOALS: Goals reviewed with patient? Yes  SHORT TERM GOALS: Target date: 03/14/2022    Pt will demo IND with HEP                    Baseline: Not IND            Goal status: INITIAL   LONG TERM GOALS: Target date: 04/25/2022    1.Pt will demo proper deep core coordination without chest breathing and optimal excursion of diaphragm/pelvic floor in order to promote spinal stability and pelvic floor function  Baseline: dyscoordination Goal status: INITIAL  2.  Pt will demo > 5 pt change on FOTO  to improve QOL and function  PFDI Urinary baseline - 46   Urinary Problem baseline- 53  Lumber baseline  -  50   Goal status: INITIAL  3.  Pt will demo proper body mechanics in against gravity tasks and ADLs  work tasks, fitness  to minimize straining pelvic floor / back                  Baseline: not IND, improper form that places strain on pelvic floor                Goal status: INITIAL    4. Pt is able to make it to the bathroom in  time when her bladder is full and after drinking tea and experience decreased episodes of leakage before making it to the toilet from 3 x week to < 1 x week in order to improve QOL . Baseline: These episodes occur 3 x a week when she first wakes up. Goal status: INITIAL    5. Pt  will report being able to stand at sink with food preparation and doing dishes > 20 min and have no radiating pain  to the L side  to iliac crest L) in order to perform ADLs  Baseline: Pain 8-9/10 with these activities after 20 min.   Eases with stopping her activity and resting with heating pad or lidocain patch  Goal status: INITIAL   6. Pt will demo levelled pelvic girdle and shoulder height in order to progress to deep core strengthening HEP and restore mobility at spine, pelvis, gait, posture   Baseline: R iliac crest , R shoulder higher  Goal status: INITIAL    Mariane Masters, PT 03/24/2022, 9:49 AM

## 2022-04-12 ENCOUNTER — Ambulatory Visit: Payer: Medicare Other | Attending: Obstetrics and Gynecology | Admitting: Physical Therapy

## 2022-04-12 DIAGNOSIS — M62838 Other muscle spasm: Secondary | ICD-10-CM | POA: Diagnosis present

## 2022-04-12 DIAGNOSIS — R278 Other lack of coordination: Secondary | ICD-10-CM | POA: Diagnosis present

## 2022-04-12 DIAGNOSIS — M5442 Lumbago with sciatica, left side: Secondary | ICD-10-CM | POA: Diagnosis present

## 2022-04-12 DIAGNOSIS — M533 Sacrococcygeal disorders, not elsewhere classified: Secondary | ICD-10-CM

## 2022-04-12 DIAGNOSIS — R2689 Other abnormalities of gait and mobility: Secondary | ICD-10-CM | POA: Diagnosis present

## 2022-04-12 DIAGNOSIS — G8929 Other chronic pain: Secondary | ICD-10-CM | POA: Diagnosis present

## 2022-04-12 DIAGNOSIS — R279 Unspecified lack of coordination: Secondary | ICD-10-CM

## 2022-04-12 NOTE — Therapy (Signed)
OUTPATIENT PHYSICAL THERAPY Treatment    Patient Name: Kristi Maldonado MRN: [id number] DOB:1940-03-29, 82 y.o., female Today's Date: 04/12/2022   PT End of Session - 04/12/22 1028     Visit Number 5    Number of Visits 10    Date for PT Re-Evaluation 04/25/22   eval 02/14/22   PT Start Time 1025    PT Stop Time 1128    PT Time Calculation (min) 63 min    Activity Tolerance Patient tolerated treatment well;No increased pain    Behavior During Therapy WFL for tasks assessed/performed             Past Medical History:  Diagnosis Date   Degenerative disc disease, lumbar    Difficult airway for intubation    Patient told by anesthesiology that she should tell future providers to always use a pediatric ET tube due to narrow airway and prior surgical issues including scarring   Difficult intubation    Narrow airway.  Needs pediatric tube   Hypertension    Hypothyroidism    Thyroid disease    Vertigo    x1   Wears dentures    partial lower   Past Surgical History:  Procedure Laterality Date   CATARACT EXTRACTION W/PHACO Left 01/20/2020   Procedure: CATARACT EXTRACTION PHACO AND INTRAOCULAR LENS PLACEMENT (IOC) LEFT 6.48 00:42.2;  Surgeon: Birder Robson, MD;  Location: [place];  Service: Ophthalmology;  Laterality: Left;   CATARACT EXTRACTION W/PHACO Right 03/16/2020   Procedure: CATARACT EXTRACTION PHACO AND INTRAOCULAR LENS PLACEMENT ([place]) RIGHT;  Surgeon: Birder Robson, MD;  Location: [place];  Service: Ophthalmology;  Laterality: Right;  6.11 0:43.5   SALIVARY GLAND SURGERY     THYROID SURGERY  2000   total vaginal hysterectomy, bilateral uterosacral ligament vaginal vault suspension, anterior colporrhaphy, perineorrhaphy, cystourethroscopy surgery on 08/11/2020  2022   TUBAL LIGATION     Patient Active Problem List   Diagnosis Date Noted   Allergic reaction 06/09/2017   DIVERTICULITIS, COLON 02/05/2009   IBS 02/05/2009    PCP: Emily Filbert MD   REFERRING PROVIDER: Sharlett Iles MD   REFERRING DIAG: .3 (ICD-10-CM) - Stress incontinence (female) (female)   Rationale for Evaluation and Treatment Rehabilitation  THERAPY DIAG:  Other abnormalities of gait and mobility  Unspecified lack of coordination  Sacrococcygeal disorders, not elsewhere classified  Other lack of coordination  Other muscle spasm  Chronic low back pain with left-sided sciatica, unspecified back pain laterality  ONSET DATE:   SUBJECTIVE:            SUBJECTIVE STATEMENT Today:  Pt noticed her LBP is better.  Pt has not been consistent doing her HEP. Pt wants to know if she can return to classes at the gym ( water aerobics) and treadmill. Pt has PAD issues at legs  SUBJECTIVE STATEMENT on EVAL 02/14/22 : 1) urinary urge incontinence: Pt is not able to make it to the bathroom in time when her bladder is full and after drinking tea. These episodes occur 3 x a week when she first wakes up. Pt wears a pad at night when sleeping so she is prepared for the leakage in the morning.    2) CLBP : occurs standing at sink with food preparation and doing dishes and it radiates to the L side by ( pt points to iliac crest L).  8-9/10.  Eases with stopping her activity and resting with heating pad or lidocain patch. LBP also occurs with ending.    3) left neck pain started two weeks ago. It came on gradually . No radiating down the arm. Pt has been sitting at the computer.  Pt completed PT HEP for neck and low back customized for her scoliosis back in 2022.     PERTINENT HISTORY:  total vaginal hysterectomy, bilateral uterosacral ligament vaginal vault suspension, anterior colporrhaphy, perineorrhaphy, cystourethroscopy surgery on 06/08/202    claudication of BLE  ( L worse than R)  , LBP Pt  jsut started a workout routine at the [facility] last week.  Pt has attended a class in the pool. Pt plans to use recumbent bike. Pt walks on the cushioned trail.    PAIN:  Are you having pain? No    PRECAUTIONS: None  WEIGHT BEARING RESTRICTIONS: No lifting weight > 50 lb from Dr. Sharlet Salina who treats her for her LBP   FALLS:  Has patient fallen in last 6 months? No  LIVING ENVIRONMENT: Lives with:  lives alone Lives in: House/apartment Stairs: No Has following equipment at home: None  OCCUPATION: retired   PLOF: Independent  PATIENT GOALS:   Get a workout routine that is safe for LBP and claudication of BLE and to make it to the toilet in time before leakage    OBJECTIVE:    [facility] PT Assessment - 04/12/22 1037       Sit to Stand   Comments les genu valgus B      Strength   Overall Strength Comments BLE 4/5,      Palpation   Palpation comment tightness and tenderness along B  medial scapula,             [facility] Adult PT Treatment/Exercise - 04/12/22 1036       Therapeutic Activites    Other Therapeutic Activities discussed returning to aerobic water class, use recumbent bike and walking instead of treadmill for now, be consistent with the PELVIC PT HEP      Neuro Re-ed    Neuro Re-ed Details  excessive cues for new HEP for feet propioception and deep core level 1-2      Modalities   Modalities Moist Heat      Moist Heat Therapy   Number Minutes Moist Heat 5 Minutes    Moist Heat Location --   thoracic ( unbilled)     Manual Therapy   Manual therapy comments STM/MWM along R paraspinals, intercostals to lengthen R flank to adjust for scoliosis               HOME EXERCISE PROGRAM: See pt instruction section    ASSESSMENT:  CLINICAL IMPRESSION:    Pt required excessive cues for lower kinetic propioception with new glut strengthening and scapular / thoracolumbar strengthening. Video was recorded on pt's phone per pt's permission to promote  compliance.  Pt required  cues for deep core training. Provided guidance with gradual integration to gym activities with considerations to PAD and pt's scoliosis.                Provided motivational interviewing to build compliance to HEP. Encouraged body awareness in sitting posture at computer to minimize asymmetrical spine alignment.  Pelvic floor program will be more effective once pt's pelvic girdle and shoulders are levelled and spinal alignment is achieved along with stronger hip abduction strength.  Pt will continue to require more cervical retraction propioception training.  Internal pelvic floor assessment planned for next session.    Pt benefits from skilled PT.    OBJECTIVE IMPAIRMENTS decreased activity tolerance, decreased coordination, decreased endurance, decreased mobility, difficulty walking, decreased ROM, decreased strength, decreased safety awareness, hypomobility, increased muscle spasms, impaired flexibility, improper body mechanics, postural dysfunction, and pain. scar restrictions   ACTIVITY LIMITATIONS  self-care,  sleep, home chores, work tasks    PARTICIPATION LIMITATIONS:  community, gym activities    PERSONAL FACTORS  Prolapse surgery is also affecting patient's functional outcome.    REHAB POTENTIAL: Good   CLINICAL DECISION MAKING: Evolving/moderate complexity   EVALUATION COMPLEXITY: Moderate    PATIENT EDUCATION:    Education details: Showed pt anatomy images. Explained muscles attachments/ connection, physiology of deep core system/ spinal- thoracic-pelvis-lower kinetic chain as they relate to pt's presentation, Sx, and past Hx. Explained what and how these areas of deficits need to be restored to balance and function    See Therapeutic activity / neuromuscular re-education section  Answered pt's questions.   Person educated: Patient Education method: Explanation, Demonstration, Tactile cues, Verbal cues, and Handouts Education comprehension:  verbalized understanding, returned demonstration, verbal cues required, tactile cues required, and needs further education     PLAN: PT FREQUENCY: 1x/week   PT DURATION: 10 weeks   PLANNED INTERVENTIONS: Therapeutic exercises, Therapeutic activity, Neuromuscular re-education, Balance training, Gait training, Patient/Family education, Self Care, Joint mobilization, Spinal mobilization, Moist heat, Taping, and Manual therapy, dry needling.   PLAN FOR NEXT SESSION: See clinical impression for plan     GOALS: Goals reviewed with patient? Yes  SHORT TERM GOALS: Target date: 03/14/2022    Pt will demo IND with HEP                    Baseline: Not IND            Goal status: INITIAL   LONG TERM GOALS: Target date: 04/25/2022    1.Pt will demo proper deep core coordination without chest breathing and optimal excursion of diaphragm/pelvic floor in order to promote spinal stability and pelvic floor function  Baseline: dyscoordination Goal status: INITIAL  2.  Pt will demo > 5 pt change on FOTO  to improve QOL and function  PFDI Urinary baseline - 46   Urinary Problem baseline- 53  Lumber baseline  -  50   Goal status: INITIAL  3.  Pt will demo proper body mechanics in against gravity tasks and ADLs  work tasks, fitness  to minimize straining pelvic floor / back                  Baseline: not IND, improper form that places strain on pelvic floor                Goal status: INITIAL    4. Pt is able to make it to the bathroom in time when her bladder is full and after drinking tea  and experience decreased episodes of leakage before making it to the toilet from 3 x week to < 1 x week in order to improve QOL . Baseline: These episodes occur 3 x a week when she first wakes up. Goal status: INITIAL    5. Pt will report being able to stand at sink with food preparation and doing dishes > 20 min and have no radiating pain  to the L side  to iliac crest L) in order to perform  ADLs  Baseline: Pain 8-9/10 with these activities after 20 min.   Eases with stopping her activity and resting with heating pad or lidocain patch  Goal status: INITIAL   6. Pt will demo levelled pelvic girdle and shoulder height in order to progress to deep core strengthening HEP and restore mobility at spine, pelvis, gait, posture   Baseline: R iliac crest , R shoulder higher  Goal status: INITIAL    Jerl Mina, PT 04/12/2022, 11:49 AM

## 2022-04-12 NOTE — Patient Instructions (Signed)
Maintain deep core level 1-2 in the morning and night   __   SKI against chair   In front of chair, knee against seat to line knee above ankle,  back foot hip width apart, push off through ballmounds,  opp arm up, thumbs up, chin tuck, shoulders down  Push off through ballmounds , step back to under hip width   2 min , rest for 2 min, do 2nd set 2 min   Morning and  afternoon and evening ( watch video)    ___   Gym 2 x week next week:    Stretches ( will go over next week)   Walk 10 min  Recumbent bike 10 min with breaks   Stretches ( will go over next week)    __  Wait to do the water aerobics after you warmed up a week of walking and recumbent routine   Be consistent with the pelvic PT exercises above daily   __  Set time to take breaks at computer every 45 min DO NOT LEAN ON ONE SIDE OF THE CHAIR

## 2022-04-13 ENCOUNTER — Encounter: Payer: Medicare Other | Admitting: Physical Therapy

## 2022-04-17 ENCOUNTER — Ambulatory Visit: Payer: Medicare Other | Admitting: Physical Therapy

## 2022-04-17 DIAGNOSIS — R279 Unspecified lack of coordination: Secondary | ICD-10-CM

## 2022-04-17 DIAGNOSIS — G8929 Other chronic pain: Secondary | ICD-10-CM

## 2022-04-17 DIAGNOSIS — R2689 Other abnormalities of gait and mobility: Secondary | ICD-10-CM | POA: Diagnosis not present

## 2022-04-17 DIAGNOSIS — R278 Other lack of coordination: Secondary | ICD-10-CM

## 2022-04-17 DIAGNOSIS — M62838 Other muscle spasm: Secondary | ICD-10-CM

## 2022-04-17 DIAGNOSIS — M533 Sacrococcygeal disorders, not elsewhere classified: Secondary | ICD-10-CM

## 2022-04-17 NOTE — Patient Instructions (Signed)
   3- foot tap  10 reps  Each side   Hold onto wall   Slightly bend of standing knee, and keep hips above foot   ballmound of opposite leg   taps to each direction and   back to spot under hips- notice equal pressure through both legs, and across ballmound and heels    ___   Practice the back ward lunges without arms moving, only resting at wall in corner  Knee above ankle Back foot and ankle/ heel straight Longer strep Big toe and pink toe pressed down of back foot    __  Midstretch:  ZIG ZAG   Lay on your back, knees bend Scoot hips to the R by pressing elbows and feet into the bed to life butt , leave shoulders in place Wobble knees to the L side 45 deg and to midline  10 reps    For 5 min  resting onto pillows to keep leg at the same width of hips Pillow under L thigh to minimize too much strain '

## 2022-04-17 NOTE — Therapy (Addendum)
OUTPATIENT PHYSICAL THERAPY Treatment    Patient Name: Shulonda Demichael MRN: [id number] DOB:1940-11-24, 82 y.o., female Today's Date: 04/17/2022   PT End of Session - 04/17/22 1404     Visit Number 6    Number of Visits 10    Date for PT Re-Evaluation 04/25/22   eval 02/14/22   PT Start Time 0930    PT Stop Time 1015    PT Time Calculation (min) 45 min    Activity Tolerance Patient tolerated treatment well;No increased pain    Behavior During Therapy WFL for tasks assessed/performed             Past Medical History:  Diagnosis Date   Degenerative disc disease, lumbar    Difficult airway for intubation    Patient told by anesthesiology that she should tell future providers to always use a pediatric ET tube due to narrow airway and prior surgical issues including scarring   Difficult intubation    Narrow airway.  Needs pediatric tube   Hypertension    Hypothyroidism    Thyroid disease    Vertigo    x1   Wears dentures    partial lower   Past Surgical History:  Procedure Laterality Date   CATARACT EXTRACTION W/PHACO Left 01/20/2020   Procedure: CATARACT EXTRACTION PHACO AND INTRAOCULAR LENS PLACEMENT (IOC) LEFT 6.48 00:42.2;  Surgeon: Birder Robson, MD;  Location: [place];  Service: Ophthalmology;  Laterality: Left;   CATARACT EXTRACTION W/PHACO Right 03/16/2020   Procedure: CATARACT EXTRACTION PHACO AND INTRAOCULAR LENS PLACEMENT ([place]) RIGHT;  Surgeon: Birder Robson, MD;  Location: [place];  Service: Ophthalmology;  Laterality: Right;  6.11 0:43.5   SALIVARY GLAND SURGERY     THYROID SURGERY  2000   total vaginal hysterectomy, bilateral uterosacral ligament vaginal vault suspension, anterior colporrhaphy, perineorrhaphy, cystourethroscopy surgery on 08/11/2020  2022   TUBAL LIGATION     Patient Active Problem List   Diagnosis Date Noted   Allergic reaction 06/09/2017   DIVERTICULITIS, COLON 02/05/2009   IBS 02/05/2009    PCP: Emily Filbert MD   REFERRING PROVIDER: Sharlett Iles MD   REFERRING DIAG: .3 (ICD-10-CM) - Stress incontinence (female) (female)   Rationale for Evaluation and Treatment Rehabilitation  THERAPY DIAG:  Other abnormalities of gait and mobility  Unspecified lack of coordination  Sacrococcygeal disorders, not elsewhere classified  Other lack of coordination  Other muscle spasm  Chronic low back pain with left-sided sciatica, unspecified back pain laterality  ONSET DATE:   SUBJECTIVE:            SUBJECTIVE STATEMENT Today:  Pt was not able to set up a chair against a wall for the exercise. Pt noticed tightness along midback with last session's exericse  SUBJECTIVE STATEMENT on EVAL 02/14/22 : 1) urinary urge incontinence: Pt is not able to make it to the bathroom in time when her bladder is full and after drinking tea. These episodes occur 3 x a week when she first wakes up. Pt wears a pad at night when sleeping so she is prepared for the leakage in the morning.    2) CLBP : occurs standing at sink with food preparation and doing dishes and it radiates to the L side by ( pt points to iliac crest L).  8-9/10.  Eases with stopping her activity and resting with heating pad or lidocain patch. LBP also occurs with ending.    3) left neck pain started two weeks ago. It came on gradually . No radiating down the arm. Pt has been sitting at the computer.  Pt completed PT HEP for neck and low back customized for her scoliosis back in 2022.     PERTINENT HISTORY:  total vaginal hysterectomy, bilateral uterosacral ligament vaginal vault suspension, anterior colporrhaphy, perineorrhaphy, cystourethroscopy surgery on 06/08/202    claudication of BLE  ( L worse than R)  , LBP Pt jsut started a workout routine at the [facility] last week.  Pt  has attended a class in the pool. Pt plans to use recumbent bike. Pt walks on the cushioned trail.    PAIN:  Are you having pain? No    PRECAUTIONS: None  WEIGHT BEARING RESTRICTIONS: No lifting weight > 50 lb from Dr. Sharlet Salina who treats her for her LBP   FALLS:  Has patient fallen in last 6 months? No  LIVING ENVIRONMENT: Lives with:  lives alone Lives in: House/apartment Stairs: No Has following equipment at home: None  OCCUPATION: retired   PLOF: Independent  PATIENT GOALS:   Get a workout routine that is safe for LBP and claudication of BLE and to make it to the toilet in time before leakage    OBJECTIVE:    [facility] PT Assessment - 04/17/22 1415       Observation/Other Assessments   Observations anterior placement of knee in backward lunges stepping,  poor prioception of knee and back foot      Sit to Stand   Comments no cues required             [facility] Adult PT Treatment/Exercise - 04/17/22 1410       Therapeutic Activites    Other Therapeutic Activities modified stepping back exercise at corner since pt was not able to set up chair against wall at home, withheld arm movements due to difficulty with lower LE      Neuro Re-ed    Neuro Re-ed Details  cued for stretches, excessive cues for  foot propioception in backward lunges , dissassociation of pelvis and trunk               HOME EXERCISE PROGRAM: See pt instruction section    ASSESSMENT:  CLINICAL IMPRESSION:    Pt required excessive cues for lower kinetic propioception with backward stepping with hands on wall at corner. This was the modification provided because pt was not able to set up chair against wall at home. Withheld arm movements due to difficulty with lower LE. Pt demo'd improved dissociation of trunk BLE from trunk without cues after training. Cued for more toe extension of all 5 digits, knee/ hip flexion, and push off to facilitate more circulation given pt's PAD condition. Pt  reported she noticed she felt PAD Sx at beginning of  session and at end of session, she noticed tingling as in circulation sensation, not numbness. Pt reported fatigue and continued to advise chunking activities and rest breaks.   Provided guidance with gradual integration to gym activities with considerations to PAD.  Pelvic floor program will be more effective once pt's pelvic girdle and shoulders are levelled and spinal alignment is achieved along with stronger hip abduction strength.  Pt will continue to require more cervical retraction propioception training.  Internal pelvic floor assessment planned for next session.    Pt benefits from skilled PT.    OBJECTIVE IMPAIRMENTS decreased activity tolerance, decreased coordination, decreased endurance, decreased mobility, difficulty walking, decreased ROM, decreased strength, decreased safety awareness, hypomobility, increased muscle spasms, impaired flexibility, improper body mechanics, postural dysfunction, and pain. scar restrictions   ACTIVITY LIMITATIONS  self-care,  sleep, home chores, work tasks    PARTICIPATION LIMITATIONS:  community, gym activities    [place]  Prolapse surgery is also affecting patient's functional outcome.    REHAB POTENTIAL: Good   CLINICAL DECISION MAKING: Evolving/moderate complexity   EVALUATION COMPLEXITY: Moderate    PATIENT EDUCATION:    Education details: Showed pt anatomy images. Explained muscles attachments/ connection, physiology of deep core system/ spinal- thoracic-pelvis-lower kinetic chain as they relate to pt's presentation, Sx, and past Hx. Explained what and how these areas of deficits need to be restored to balance and function    See Therapeutic activity / neuromuscular re-education section  Answered pt's questions.   Person educated: Patient Education method: Explanation, Demonstration, Tactile cues, Verbal cues, and Handouts Education comprehension: verbalized understanding,  returned demonstration, verbal cues required, tactile cues required, and needs further education     PLAN: PT FREQUENCY: 1x/week   PT DURATION: 10 weeks   PLANNED INTERVENTIONS: Therapeutic exercises, Therapeutic activity, Neuromuscular re-education, Balance training, Gait training, Patient/Family education, Self Care, Joint mobilization, Spinal mobilization, Moist heat, Taping, and Manual therapy, dry needling.   PLAN FOR NEXT SESSION: See clinical impression for plan     GOALS: Goals reviewed with patient? Yes  SHORT TERM GOALS: Target date: 03/14/2022    Pt will demo IND with HEP                    Baseline: Not IND            Goal status: INITIAL   LONG TERM GOALS: Target date: 04/25/2022    1.Pt will demo proper deep core coordination without chest breathing and optimal excursion of diaphragm/pelvic floor in order to promote spinal stability and pelvic floor function  Baseline: dyscoordination Goal status: INITIAL  2.  Pt will demo > 5 pt change on FOTO  to improve QOL and function  PFDI Urinary baseline - 46   Urinary Problem baseline- 53  Lumber baseline  -  50   Goal status: INITIAL  3.  Pt will demo proper body mechanics in against gravity tasks and ADLs  work tasks, fitness  to minimize straining pelvic floor / back                  Baseline: not IND, improper form that places strain on pelvic floor                Goal status: INITIAL    4. Pt is able to make it to the bathroom in time when her bladder is full and after drinking tea and experience decreased episodes of leakage before making it to the toilet from 3 x  week to < 1 x week in order to improve QOL . Baseline: These episodes occur 3 x a week when she first wakes up. Goal status: INITIAL    5. Pt will report being able to stand at sink with food preparation and doing dishes > 20 min and have no radiating pain  to the L side  to iliac crest L) in order to perform ADLs  Baseline: Pain 8-9/10 with  these activities after 20 min.   Eases with stopping her activity and resting with heating pad or lidocain patch  Goal status: INITIAL   6. Pt will demo levelled pelvic girdle and shoulder height in order to progress to deep core strengthening HEP and restore mobility at spine, pelvis, gait, posture   Baseline: R iliac crest , R shoulder higher  Goal status: INITIAL    Jerl Mina, PT 04/17/2022, 2:19 PM

## 2022-04-24 ENCOUNTER — Ambulatory Visit: Payer: Medicare Other | Admitting: Physical Therapy

## 2022-04-24 DIAGNOSIS — R278 Other lack of coordination: Secondary | ICD-10-CM

## 2022-04-24 DIAGNOSIS — R2689 Other abnormalities of gait and mobility: Secondary | ICD-10-CM | POA: Diagnosis not present

## 2022-04-24 DIAGNOSIS — R279 Unspecified lack of coordination: Secondary | ICD-10-CM

## 2022-04-24 DIAGNOSIS — G8929 Other chronic pain: Secondary | ICD-10-CM

## 2022-04-24 DIAGNOSIS — M62838 Other muscle spasm: Secondary | ICD-10-CM

## 2022-04-24 DIAGNOSIS — M533 Sacrococcygeal disorders, not elsewhere classified: Secondary | ICD-10-CM

## 2022-04-24 NOTE — Patient Instructions (Addendum)
Avoid belly sleeping to minimize asymmetries to spine   Sleep on back with one pillow above shoulders,  Pillows under arms over belly to acclimate being on back   Stretches : (Cuing provided for proper alignment)  Instructions start with Strap on R    Stretches for your legs: LAYING on Back Use upper arms and elbows for stability when pulling strap Opposite knee bent and foot firm in align with hip   Strap on ballmound:  Hamstring _knee bends  10 reps  With knee pointing straight ( slightly to outside to minimize snapping sensation)      10 reps with knee pointing out towards armpit ( notice the stretch in the medial hamstring muscle)

## 2022-04-24 NOTE — Therapy (Signed)
OUTPATIENT PHYSICAL THERAPY Treatment / Recert    Patient Name: Kristi Maldonado MRN: [id number] DOB:Feb 10, 1941, 82 y.o., female Today's Date: 04/24/2022   PT End of Session - 04/24/22 1034     Visit Number 7    Number of Visits 17    Date for PT Re-Evaluation 07/03/22   eval 02/14/22   PT Start Time 1030    PT Stop Time 1115    PT Time Calculation (min) 45 min    Activity Tolerance Patient tolerated treatment well;No increased pain    Behavior During Therapy WFL for tasks assessed/performed             Past Medical History:  Diagnosis Date   Degenerative disc disease, lumbar    Difficult airway for intubation    Patient told by anesthesiology that she should tell future providers to always use a pediatric ET tube due to narrow airway and prior surgical issues including scarring   Difficult intubation    Narrow airway.  Needs pediatric tube   Hypertension    Hypothyroidism    Thyroid disease    Vertigo    x1   Wears dentures    partial lower   Past Surgical History:  Procedure Laterality Date   CATARACT EXTRACTION W/PHACO Left 01/20/2020   Procedure: CATARACT EXTRACTION PHACO AND INTRAOCULAR LENS PLACEMENT (IOC) LEFT 6.48 00:42.2;  Surgeon: Birder Robson, MD;  Location: [place];  Service: Ophthalmology;  Laterality: Left;   CATARACT EXTRACTION W/PHACO Right 03/16/2020   Procedure: CATARACT EXTRACTION PHACO AND INTRAOCULAR LENS PLACEMENT ([place]) RIGHT;  Surgeon: Birder Robson, MD;  Location: [place];  Service: Ophthalmology;  Laterality: Right;  6.11 0:43.5   SALIVARY GLAND SURGERY     THYROID SURGERY  2000   total vaginal hysterectomy, bilateral uterosacral ligament vaginal vault suspension, anterior colporrhaphy, perineorrhaphy, cystourethroscopy surgery on 08/11/2020  2022   TUBAL LIGATION     Patient Active Problem List   Diagnosis Date Noted   Allergic reaction 06/09/2017   DIVERTICULITIS, COLON 02/05/2009   IBS 02/05/2009     PCP: Emily Filbert MD   REFERRING PROVIDER: Sharlett Iles MD   REFERRING DIAG: .3 (ICD-10-CM) - Stress incontinence (female) (female)   Rationale for Evaluation and Treatment Rehabilitation  THERAPY DIAG:  Other abnormalities of gait and mobility  Unspecified lack of coordination  Sacrococcygeal disorders, not elsewhere classified  Other lack of coordination  Other muscle spasm  Chronic low back pain with left-sided sciatica, unspecified back pain laterality  ONSET DATE:   SUBJECTIVE:            SUBJECTIVE STATEMENT Today:  Pt went to the gym 2 x last week, Pt did the recumbent bike 10 min x 2. Pt tried to walk but after 6 min, pt noticed cramping in the L calf and again later that evening. Pt has not had labs done nor seen her PCP since Nov 2023.   Pt reports she sleeps on her belly  SUBJECTIVE STATEMENT on EVAL 02/14/22 : 1) urinary urge incontinence: Pt is not able to make it to the bathroom in time when her bladder is full and after drinking tea. These episodes occur 3 x a week when she first wakes up. Pt wears a pad at night when sleeping so she is prepared for the leakage in the morning.    2) CLBP : occurs standing at sink with food preparation and doing dishes and it radiates to the L side by ( pt points to iliac crest L).  8-9/10.  Eases with stopping her activity and resting with heating pad or lidocain patch. LBP also occurs with ending.    3) left neck pain started two weeks ago. It came on gradually . No radiating down the arm. Pt has been sitting at the computer.  Pt completed PT HEP for neck and low back customized for her scoliosis back in 2022.     PERTINENT HISTORY:  total vaginal hysterectomy, bilateral uterosacral ligament vaginal vault suspension, anterior colporrhaphy, perineorrhaphy,  cystourethroscopy surgery on 06/08/202    claudication of BLE  ( L worse than R)  , LBP Pt jsut started a workout routine at the [facility] last week.  Pt has attended a class in the pool. Pt plans to use recumbent bike. Pt walks on the cushioned trail.    PAIN:  Are you having pain? No    PRECAUTIONS: None  WEIGHT BEARING RESTRICTIONS: No lifting weight > 50 lb from Dr. Sharlet Salina who treats her for her LBP   FALLS:  Has patient fallen in last 6 months? No  LIVING ENVIRONMENT: Lives with:  lives alone Lives in: House/apartment Stairs: No Has following equipment at home: None  OCCUPATION: retired   PLOF: Independent  PATIENT GOALS:   Get a workout routine that is safe for LBP and claudication of BLE and to make it to the toilet in time before leakage    OBJECTIVE:     [facility] PT Assessment - 04/24/22 1042       AROM   Overall AROM Comments 90-90 position: L knee ext 140 deg, R 150 deg      Palpation   SI assessment  supine:  levelled ASIS, L lower rib higher than R ,    Palpation comment deviated T2-3 segments to L , toghtness at medial scapula, scalenes, upper trap L             [facility] Adult PT Treatment/Exercise - 04/24/22 1111       Transfers   Comments educated pt on technique to avoid sleep in on belly and to adjust to sleeping on her back  to minimize scoliosis      Therapeutic Activites    Other Therapeutic Activities educated pt on technique to avoid sleep in on belly and to adjust to sleeping on her back to minimize scoliosis      Neuro Re-ed    Neuro Re-ed Details  cued for cervico/scapular retraction, cued for sleeping position on back      Modalities   Modalities Moist Heat      Moist Heat Therapy   Number Minutes Moist Heat 5 Minutes      Manual Therapy   Manual therapy comments traction at occiput, T2-3, STM/MWM at mm noted in assessment                HOME EXERCISE PROGRAM: See pt instruction section    ASSESSMENT:  CLINICAL  IMPRESSION:  Pt has achieved 3/  7 goals and progressing well towards remaining goals.  Pt's posture has improved with less deviations. Today, focused on manual Tx to further improve C/T junction deviations which is contributed to pt's preferred sleeping position being on her belly. Pt was educated rationale to convert to to side sleeping and on her back and strategies to make this transition. Anticipate this habit will help minimize scoliosis and pelvic misalignment which will in turn, yield better outcomes for her LBP and pelvic floor function.    Provided guidance with gradual integration to gym activities with considerations to PAD but pt showed L calf tightness with walking 6 min. Focused on increasing hamstring mobility today with HEP stretches.    Pelvic floor program will be more effective as t pt's pelvic girdle and shoulders are levelled and spinal alignment is achieved.   Pt will continue to require more cervical retraction propioception training.    Internal pelvic floor assessment at upcoming sessions to minimize relapse of prolapse Sx 2/2 hysterectomy.    Pt benefits from skilled PT.    OBJECTIVE IMPAIRMENTS decreased activity tolerance, decreased coordination, decreased endurance, decreased mobility, difficulty walking, decreased ROM, decreased strength, decreased safety awareness, hypomobility, increased muscle spasms, impaired flexibility, improper body mechanics, postural dysfunction, and pain. scar restrictions   ACTIVITY LIMITATIONS  self-care,  sleep, home chores, work tasks    PARTICIPATION LIMITATIONS:  community, gym activities    [place]  Prolapse surgery is also affecting patient's functional outcome.    REHAB POTENTIAL: Good   CLINICAL DECISION MAKING: Evolving/moderate complexity   EVALUATION COMPLEXITY: Moderate    PATIENT EDUCATION:    Education details: Showed pt anatomy images. Explained muscles attachments/ connection, physiology of deep  core system/ spinal- thoracic-pelvis-lower kinetic chain as they relate to pt's presentation, Sx, and past Hx. Explained what and how these areas of deficits need to be restored to balance and function    See Therapeutic activity / neuromuscular re-education section  Answered pt's questions.   Person educated: Patient Education method: Explanation, Demonstration, Tactile cues, Verbal cues, and Handouts Education comprehension: verbalized understanding, returned demonstration, verbal cues required, tactile cues required, and needs further education     PLAN: PT FREQUENCY: 1x/week   PT DURATION: 10 weeks   PLANNED INTERVENTIONS: Therapeutic exercises, Therapeutic activity, Neuromuscular re-education, Balance training, Gait training, Patient/Family education, Self Care, Joint mobilization, Spinal mobilization, Moist heat, Taping, and Manual therapy, dry needling.   PLAN FOR NEXT SESSION: See clinical impression for plan     GOALS: Goals reviewed with patient? Yes  SHORT TERM GOALS: Target date: 03/14/2022    Pt will demo IND with HEP                    Baseline: Not IND            Goal status:MET   LONG TERM GOALS: Target date: 04/25/2022    1.Pt will demo proper deep core coordination without chest breathing and optimal excursion of diaphragm/pelvic floor in order to promote spinal stability and pelvic floor function  Baseline: dyscoordination Goal status: MET  2.  Pt will demo > 5 pt change on FOTO  to improve QOL and function  PFDI Urinary baseline - 46  --->  54 pts  Urinary Problem baseline- 53  --> 58 pts  Lumber baseline  -  50  --> 47 pts   Goal status" On going   3.  Pt will demo proper body mechanics in against gravity tasks and ADLs  work tasks, fitness  to minimize straining pelvic floor / back                  Baseline: not IND, improper form that places strain on pelvic floor                Goal status: On going     4. Pt is able to make it to the  bathroom in time when her bladder is full and after drinking tea and experience decreased episodes of leakage before making it to the toilet from 3 x week to < 1 x week in order to improve QOL . Baseline: These episodes occur 3 x a week when she first wakes up. Goal status: MET     5. Pt will report being able to stand at sink with food preparation and doing dishes > 20 min and have no radiating pain  to the L side  to iliac crest L) in order to perform ADLs  Baseline: Pain 8-9/10 with these activities after 20 min.   Eases with stopping her activity and resting with heating pad or lidocain patch  Goal status: On going    6. Pt will demo levelled pelvic girdle and shoulder height in order to progress to deep core strengthening HEP and restore mobility at spine, pelvis, gait, posture   Baseline: R iliac crest , R shoulder higher  Goal status: MET   7. Pt will demo increased hamstring flexibility ( knee ext deg in 90-90 position on her back) to > 160 deg in order to increase walking from > 6 min without L calf pain to improve mobility and aerobic health.    Baseline: 90-90  position: hamstring mobility: 140 deg L, 150 deg R                          Goal status: NEW    Jerl Mina, PT 04/24/2022, 10:35 AM

## 2022-04-27 ENCOUNTER — Encounter: Payer: Medicare Other | Admitting: Physical Therapy

## 2022-05-11 ENCOUNTER — Ambulatory Visit: Payer: Medicare Other | Admitting: Physical Therapy

## 2022-05-16 ENCOUNTER — Ambulatory Visit: Payer: Medicare Other | Admitting: Physical Therapy

## 2022-05-25 ENCOUNTER — Ambulatory Visit: Payer: Medicare Other | Attending: Obstetrics and Gynecology | Admitting: Physical Therapy

## 2022-05-25 DIAGNOSIS — M5442 Lumbago with sciatica, left side: Secondary | ICD-10-CM | POA: Diagnosis present

## 2022-05-25 DIAGNOSIS — M533 Sacrococcygeal disorders, not elsewhere classified: Secondary | ICD-10-CM | POA: Insufficient documentation

## 2022-05-25 DIAGNOSIS — M62838 Other muscle spasm: Secondary | ICD-10-CM

## 2022-05-25 DIAGNOSIS — G8929 Other chronic pain: Secondary | ICD-10-CM | POA: Insufficient documentation

## 2022-05-25 DIAGNOSIS — R279 Unspecified lack of coordination: Secondary | ICD-10-CM

## 2022-05-25 DIAGNOSIS — R2689 Other abnormalities of gait and mobility: Secondary | ICD-10-CM | POA: Insufficient documentation

## 2022-05-25 DIAGNOSIS — R278 Other lack of coordination: Secondary | ICD-10-CM | POA: Diagnosis present

## 2022-05-25 NOTE — Therapy (Signed)
OUTPATIENT PHYSICAL THERAPY Treatment  Patient Name: Kristi Maldonado MRN: [id number] DOB:[birth year], 82 y.o., female Today's Date: 05/25/2022   PT End of Session - 05/25/22 1029     Visit Number 8    Number of Visits 17    Date for PT Re-Evaluation 07/03/22   eval 02/14/22   PT Start Time 1027    PT Stop Time 1110    PT Time Calculation (min) 43 min    Activity Tolerance Patient tolerated treatment well;No increased pain    Behavior During Therapy WFL for tasks assessed/performed             Past Medical History:  Diagnosis Date   Degenerative disc disease, lumbar    Difficult airway for intubation    Patient told by anesthesiology that she should tell future providers to always use a pediatric ET tube due to narrow airway and prior surgical issues including scarring   Difficult intubation    Narrow airway.  Needs pediatric tube   Hypertension    Hypothyroidism    Thyroid disease    Vertigo    x1   Wears dentures    partial lower   Past Surgical History:  Procedure Laterality Date   CATARACT EXTRACTION W/PHACO Left 01/20/2020   Procedure: CATARACT EXTRACTION PHACO AND INTRAOCULAR LENS PLACEMENT (IOC) LEFT 6.48 00:42.2;  Surgeon: Birder Robson, MD;  Location: [place];  Service: Ophthalmology;  Laterality: Left;   CATARACT EXTRACTION W/PHACO Right 03/16/2020   Procedure: CATARACT EXTRACTION PHACO AND INTRAOCULAR LENS PLACEMENT ([place]) RIGHT;  Surgeon: Birder Robson, MD;  Location: [place];  Service: Ophthalmology;  Laterality: Right;  6.11 0:43.5   SALIVARY GLAND SURGERY     THYROID SURGERY  2000   total vaginal hysterectomy, bilateral uterosacral ligament vaginal vault suspension, anterior colporrhaphy, perineorrhaphy, cystourethroscopy surgery on 08/11/2020  2022   TUBAL LIGATION     Patient Active Problem List   Diagnosis Date Noted   Allergic reaction 06/09/2017   DIVERTICULITIS, COLON 02/05/2009   IBS 02/05/2009    PCP: Emily Filbert MD   REFERRING PROVIDER: Sharlett Iles MD   REFERRING DIAG: .3 (ICD-10-CM) - Stress incontinence (female) (female)   Rationale for Evaluation and Treatment Rehabilitation  THERAPY DIAG:  Other abnormalities of gait and mobility  Unspecified lack of coordination  Sacrococcygeal disorders, not elsewhere classified  Other lack of coordination  Chronic low back pain with left-sided sciatica, unspecified back pain laterality  Other muscle spasm  ONSET DATE:   SUBJECTIVE:            SUBJECTIVE STATEMENT Today:  Pt went to the pool classes but avoid jumping/ moderate aerobic   Lately pt felt pain in the midback not the low back.  Pt been trying to sleep on her side and she woke up this morning with R shoulder pain with L / R  head turn.  Pt woke up with her R hand and elbow under her head. Pt has not radiating pain.  SUBJECTIVE STATEMENT on EVAL 02/14/22 : 1) urinary urge incontinence: Pt is not able to make it to the bathroom in time when her bladder is full and after drinking tea. These episodes occur 3 x a week when she first wakes up. Pt wears a pad at night when sleeping so she is prepared for the leakage in the morning.    2) CLBP : occurs standing at sink with food preparation and doing dishes and it radiates to the L side by ( pt points to iliac crest L).  8-9/10.  Eases with stopping her activity and resting with heating pad or lidocain patch. LBP also occurs with ending.    3) left neck pain started two weeks ago. It came on gradually . No radiating down the arm. Pt has been sitting at the computer.  Pt completed PT HEP for neck and low back customized for her scoliosis back in 2022.     PERTINENT HISTORY:  total vaginal hysterectomy, bilateral uterosacral ligament vaginal vault suspension, anterior  colporrhaphy, perineorrhaphy, cystourethroscopy surgery on 06/08/202    claudication of BLE  ( L worse than R)  , LBP Pt jsut started a workout routine at the [facility] last week.  Pt has attended a class in the pool. Pt plans to use recumbent bike. Pt walks on the cushioned trail.    PAIN:  Are you having pain? No    PRECAUTIONS: None  WEIGHT BEARING RESTRICTIONS: No lifting weight > 50 lb from Dr. Sharlet Salina who treats her for her LBP   FALLS:  Has patient fallen in last 6 months? No  LIVING ENVIRONMENT: Lives with:  lives alone Lives in: House/apartment Stairs: No Has following equipment at home: None  OCCUPATION: retired   PLOF: Independent  PATIENT GOALS:   Get a workout routine that is safe for LBP and claudication of BLE and to make it to the toilet in time before leakage    OBJECTIVE:    [facility] PT Assessment - 05/25/22 1035       Palpation   Spinal mobility deviated T3-4,  tightness along interspinals, cervical , SCM, upper trap R    SI assessment  levelled shoulder and pelvis,    Palpation comment increased R upper trap tightness              [facility] Adult PT Treatment/Exercise - 05/25/22 1105       Neuro Re-ed    Neuro Re-ed Details  cued for neck stretches      Manual Therapy   Manual therapy comments traction at occiput, T2-3, STM/MWM at mm noted in assessment                HOME EXERCISE PROGRAM: See pt instruction section    ASSESSMENT:  CLINICAL IMPRESSION: Pt's posture has improved with less deviations. Today, focused on manual Tx to further improve C/T junction deviations.  PT has tried to avoid belly sleeping. Pt slept on her R side and woke up this morning with mm tightness in R side. Manual Tx addressed this and pt was able to perform angel wings exercise with less pain.     Pelvic floor program will be more effective as t pt's pelvic girdle and shoulders are levelled and spinal alignment is achieved.     Internal pelvic floor  assessment at upcoming sessions to minimize relapse of prolapse Sx 2/2 hysterectomy.   Pt benefits from skilled PT.    OBJECTIVE IMPAIRMENTS decreased activity tolerance, decreased coordination, decreased endurance, decreased  mobility, difficulty walking, decreased ROM, decreased strength, decreased safety awareness, hypomobility, increased muscle spasms, impaired flexibility, improper body mechanics, postural dysfunction, and pain. scar restrictions   ACTIVITY LIMITATIONS  self-care,  sleep, home chores, work tasks    PARTICIPATION LIMITATIONS:  community, gym activities    [place]  Prolapse surgery is also affecting patient's functional outcome.    REHAB POTENTIAL: Good   CLINICAL DECISION MAKING: Evolving/moderate complexity   EVALUATION COMPLEXITY: Moderate    PATIENT EDUCATION:    Education details: Showed pt anatomy images. Explained muscles attachments/ connection, physiology of deep core system/ spinal- thoracic-pelvis-lower kinetic chain as they relate to pt's presentation, Sx, and past Hx. Explained what and how these areas of deficits need to be restored to balance and function    See Therapeutic activity / neuromuscular re-education section  Answered pt's questions.   Person educated: Patient Education method: Explanation, Demonstration, Tactile cues, Verbal cues, and Handouts Education comprehension: verbalized understanding, returned demonstration, verbal cues required, tactile cues required, and needs further education     PLAN: PT FREQUENCY: 1x/week   PT DURATION: 10 weeks   PLANNED INTERVENTIONS: Therapeutic exercises, Therapeutic activity, Neuromuscular re-education, Balance training, Gait training, Patient/Family education, Self Care, Joint mobilization, Spinal mobilization, Moist heat, Taping, and Manual therapy, dry needling.   PLAN FOR NEXT SESSION: See clinical impression for plan     GOALS: Goals reviewed with patient? Yes  SHORT TERM  GOALS: Target date: 03/14/2022    Pt will demo IND with HEP                    Baseline: Not IND            Goal status:MET   LONG TERM GOALS: Target date: 04/25/2022    1.Pt will demo proper deep core coordination without chest breathing and optimal excursion of diaphragm/pelvic floor in order to promote spinal stability and pelvic floor function  Baseline: dyscoordination Goal status: MET  2.  Pt will demo > 5 pt change on FOTO  to improve QOL and function  PFDI Urinary baseline - 46  --->  54 pts  Urinary Problem baseline- 53  --> 58 pts  Lumber baseline  -  50  --> 47 pts   Goal status" On going   3.  Pt will demo proper body mechanics in against gravity tasks and ADLs  work tasks, fitness  to minimize straining pelvic floor / back                  Baseline: not IND, improper form that places strain on pelvic floor                Goal status: On going     4. Pt is able to make it to the bathroom in time when her bladder is full and after drinking tea and experience decreased episodes of leakage before making it to the toilet from 3 x week to < 1 x week in order to improve QOL . Baseline: These episodes occur 3 x a week when she first wakes up. Goal status: MET     5. Pt will report being able to stand at sink with food preparation and doing dishes > 20 min and have no radiating pain  to the L side  to iliac crest L) in order to perform ADLs  Baseline: Pain 8-9/10 with these activities after 20 min.   Eases with stopping her activity and resting with heating pad or  lidocain patch  Goal status: On going    6. Pt will demo levelled pelvic girdle and shoulder height in order to progress to deep core strengthening HEP and restore mobility at spine, pelvis, gait, posture   Baseline: R iliac crest , R shoulder higher  Goal status: MET   7. Pt will demo increased hamstring flexibility ( knee ext deg in 90-90 position on her back) to > 160 deg in order to increase walking from  > 6 min without L calf pain to improve mobility and aerobic health.    Baseline: 90-90  position: hamstring mobility: 140 deg L, 150 deg R                          Goal status: NEW    Jerl Mina, PT 05/25/2022, 10:30 AM

## 2022-06-07 ENCOUNTER — Ambulatory Visit: Payer: Medicare Other | Attending: Obstetrics and Gynecology | Admitting: Physical Therapy

## 2022-06-07 DIAGNOSIS — R278 Other lack of coordination: Secondary | ICD-10-CM | POA: Diagnosis present

## 2022-06-07 DIAGNOSIS — G8929 Other chronic pain: Secondary | ICD-10-CM | POA: Diagnosis present

## 2022-06-07 DIAGNOSIS — M5442 Lumbago with sciatica, left side: Secondary | ICD-10-CM | POA: Insufficient documentation

## 2022-06-07 DIAGNOSIS — R279 Unspecified lack of coordination: Secondary | ICD-10-CM | POA: Insufficient documentation

## 2022-06-07 DIAGNOSIS — R2689 Other abnormalities of gait and mobility: Secondary | ICD-10-CM | POA: Diagnosis present

## 2022-06-07 DIAGNOSIS — M533 Sacrococcygeal disorders, not elsewhere classified: Secondary | ICD-10-CM | POA: Diagnosis present

## 2022-06-07 DIAGNOSIS — M62838 Other muscle spasm: Secondary | ICD-10-CM | POA: Diagnosis present

## 2022-06-07 NOTE — Therapy (Incomplete)
OUTPATIENT PHYSICAL THERAPY Treatment  Patient Name: Kristi Maldonado MRN: [id number] DOB:10-02-1940, 82 y.o., female Today's Date: 06/07/2022   PT End of Session - 06/07/22 1113     Visit Number 9    Number of Visits 17    Date for PT Re-Evaluation 07/03/22   eval 02/14/22   PT Start Time 1110    PT Stop Time 1150    PT Time Calculation (min) 40 min    Activity Tolerance Patient tolerated treatment well;No increased pain    Behavior During Therapy WFL for tasks assessed/performed             Past Medical History:  Diagnosis Date   Degenerative disc disease, lumbar    Difficult airway for intubation    Patient told by anesthesiology that she should tell future providers to always use a pediatric ET tube due to narrow airway and prior surgical issues including scarring   Difficult intubation    Narrow airway.  Needs pediatric tube   Hypertension    Hypothyroidism    Thyroid disease    Vertigo    x1   Wears dentures    partial lower   Past Surgical History:  Procedure Laterality Date   CATARACT EXTRACTION W/PHACO Left 01/20/2020   Procedure: CATARACT EXTRACTION PHACO AND INTRAOCULAR LENS PLACEMENT (IOC) LEFT 6.48 00:42.2;  Surgeon: Birder Robson, MD;  Location: [facility];  Service: Ophthalmology;  Laterality: Left;   CATARACT EXTRACTION W/PHACO Right 03/16/2020   Procedure: CATARACT EXTRACTION PHACO AND INTRAOCULAR LENS PLACEMENT ([place]) RIGHT;  Surgeon: Birder Robson, MD;  Location: [place];  Service: Ophthalmology;  Laterality: Right;  6.11 0:43.5   SALIVARY GLAND SURGERY     THYROID SURGERY  2000   total vaginal hysterectomy, bilateral uterosacral ligament vaginal vault suspension, anterior colporrhaphy, perineorrhaphy, cystourethroscopy surgery on 08/11/2020  2022   TUBAL LIGATION     Patient Active Problem List   Diagnosis Date Noted   Allergic reaction 06/09/2017   DIVERTICULITIS, COLON 02/05/2009   IBS 02/05/2009    PCP: Emily Filbert  MD   REFERRING PROVIDER: Sharlett Iles MD   REFERRING DIAG: .3 (ICD-10-CM) - Stress incontinence (female) (female)   Rationale for Evaluation and Treatment Rehabilitation  THERAPY DIAG:  Other abnormalities of gait and mobility  Unspecified lack of coordination  Sacrococcygeal disorders, not elsewhere classified  Other lack of coordination  Other muscle spasm  Chronic low back pain with left-sided sciatica, unspecified back pain laterality  ONSET DATE:   SUBJECTIVE:            SUBJECTIVE STATEMENT Today:  Pt went to the pool classes but avoid jumping/ moderate aerobic   Lately pt felt pain in the midback not the low back.  Pt been trying to sleep on her side and she woke up this morning with R shoulder pain with L / R  head turn.  Pt woke up with her R hand and elbow under her head. Pt has not radiating pain.  SUBJECTIVE STATEMENT on EVAL 02/14/22 : 1) urinary urge incontinence: Pt is not able to make it to the bathroom in time when her bladder is full and after drinking tea. These episodes occur 3 x a week when she first wakes up. Pt wears a pad at night when sleeping so she is prepared for the leakage in the morning.    2) CLBP : occurs standing at sink with food preparation and doing dishes and it radiates to the L side by ( pt points to iliac crest L).  8-9/10.  Eases with stopping her activity and resting with heating pad or lidocain patch. LBP also occurs with ending.    3) left neck pain started two weeks ago. It came on gradually . No radiating down the arm. Pt has been sitting at the computer.  Pt completed PT HEP for neck and low back customized for her scoliosis back in 2022.     PERTINENT HISTORY:  total vaginal hysterectomy, bilateral uterosacral ligament vaginal vault suspension, anterior  colporrhaphy, perineorrhaphy, cystourethroscopy surgery on 06/08/202    claudication of BLE  ( L worse than R)  , LBP Pt jsut started a workout routine at the [facility] last week.  Pt has attended a class in the pool. Pt plans to use recumbent bike. Pt walks on the cushioned trail.    PAIN:  Are you having pain? No    PRECAUTIONS: None  WEIGHT BEARING RESTRICTIONS: No lifting weight > 50 lb from Dr. Sharlet Salina who treats her for her LBP   FALLS:  Has patient fallen in last 6 months? No  LIVING ENVIRONMENT: Lives with:  lives alone Lives in: House/apartment Stairs: No Has following equipment at home: None  OCCUPATION: retired   PLOF: Independent  PATIENT GOALS:   Get a workout routine that is safe for LBP and claudication of BLE and to make it to the toilet in time before leakage    OBJECTIVE:            HOME EXERCISE PROGRAM: See pt instruction section    ASSESSMENT:  CLINICAL IMPRESSION: Pt's posture has improved with less deviations. Today, focused on manual Tx to further improve C/T junction deviations.  PT has tried to avoid belly sleeping. Pt slept on her R side and woke up this morning with mm tightness in R side. Manual Tx addressed this and pt was able to perform angel wings exercise with less pain.     Pelvic floor program will be more effective as t pt's pelvic girdle and shoulders are levelled and spinal alignment is achieved.     Internal pelvic floor assessment at upcoming sessions to minimize relapse of prolapse Sx 2/2 hysterectomy.   Pt benefits from skilled PT.    OBJECTIVE IMPAIRMENTS decreased activity tolerance, decreased coordination, decreased endurance, decreased mobility, difficulty walking, decreased ROM, decreased strength, decreased safety awareness, hypomobility, increased muscle spasms, impaired flexibility, improper body mechanics, postural dysfunction, and pain. scar restrictions   ACTIVITY LIMITATIONS  self-care,  sleep, home  chores, work tasks    PARTICIPATION LIMITATIONS:  community, gym activities    [place]  Prolapse surgery is also affecting patient's functional outcome.    REHAB POTENTIAL: Good   CLINICAL DECISION MAKING: Evolving/moderate complexity   EVALUATION COMPLEXITY: Moderate    PATIENT EDUCATION:    Education details: Showed pt anatomy images. Explained muscles attachments/ connection, physiology of deep core system/ spinal- thoracic-pelvis-lower kinetic chain as they relate to pt's presentation, Sx, and past Hx. Explained what  and how these areas of deficits need to be restored to balance and function    See Therapeutic activity / neuromuscular re-education section  Answered pt's questions.   Person educated: Patient Education method: Explanation, Demonstration, Tactile cues, Verbal cues, and Handouts Education comprehension: verbalized understanding, returned demonstration, verbal cues required, tactile cues required, and needs further education     PLAN: PT FREQUENCY: 1x/week   PT DURATION: 10 weeks   PLANNED INTERVENTIONS: Therapeutic exercises, Therapeutic activity, Neuromuscular re-education, Balance training, Gait training, Patient/Family education, Self Care, Joint mobilization, Spinal mobilization, Moist heat, Taping, and Manual therapy, dry needling.   PLAN FOR NEXT SESSION: See clinical impression for plan     GOALS: Goals reviewed with patient? Yes  SHORT TERM GOALS: Target date: 03/14/2022    Pt will demo IND with HEP                    Baseline: Not IND            Goal status:MET   LONG TERM GOALS: Target date: 04/25/2022    1.Pt will demo proper deep core coordination without chest breathing and optimal excursion of diaphragm/pelvic floor in order to promote spinal stability and pelvic floor function  Baseline: dyscoordination Goal status: MET  2.  Pt will demo > 5 pt change on FOTO  to improve QOL and function  PFDI Urinary baseline - 46  --->   54 pts  Urinary Problem baseline- 53  --> 58 pts  Lumber baseline  -  50  --> 47 pts   Goal status" On going   3.  Pt will demo proper body mechanics in against gravity tasks and ADLs  work tasks, fitness  to minimize straining pelvic floor / back                  Baseline: not IND, improper form that places strain on pelvic floor                Goal status: On going     4. Pt is able to make it to the bathroom in time when her bladder is full and after drinking tea and experience decreased episodes of leakage before making it to the toilet from 3 x week to < 1 x week in order to improve QOL . Baseline: These episodes occur 3 x a week when she first wakes up. Goal status: MET     5. Pt will report being able to stand at sink with food preparation and doing dishes > 20 min and have no radiating pain  to the L side  to iliac crest L) in order to perform ADLs  Baseline: Pain 8-9/10 with these activities after 20 min.   Eases with stopping her activity and resting with heating pad or lidocain patch  Goal status: MET  ( no more radiating down the leg, only at the hip)    6. Pt will demo levelled pelvic girdle and shoulder height in order to progress to deep core strengthening HEP and restore mobility at spine, pelvis, gait, posture   Baseline: R iliac crest , R shoulder higher  Goal status: MET   7. Pt will demo increased hamstring flexibility ( knee ext deg in 90-90 position on her back) to > 160 deg in order to increase walking from > 6 min without L calf pain to improve mobility and aerobic health.    Baseline: 90-90  position: hamstring mobility: 140 deg L, 150  deg R                          Goal status: MET ( 06/07/22: 55 deg B)   Jerl Mina, PT 06/07/2022, 11:14 AM

## 2022-06-08 NOTE — Patient Instructions (Signed)
Walk with higher knees, more bend at ankle, longer stride, push off with toes

## 2022-06-17 IMAGING — CR DG CHEST 2V
2 series · 2 of 2 positions shown · non-contrast
Comparison: March 22, 2019

CLINICAL DATA: Chest pain and cardiac palpitations

EXAM:
CHEST - 2 VIEW

[chest pa]
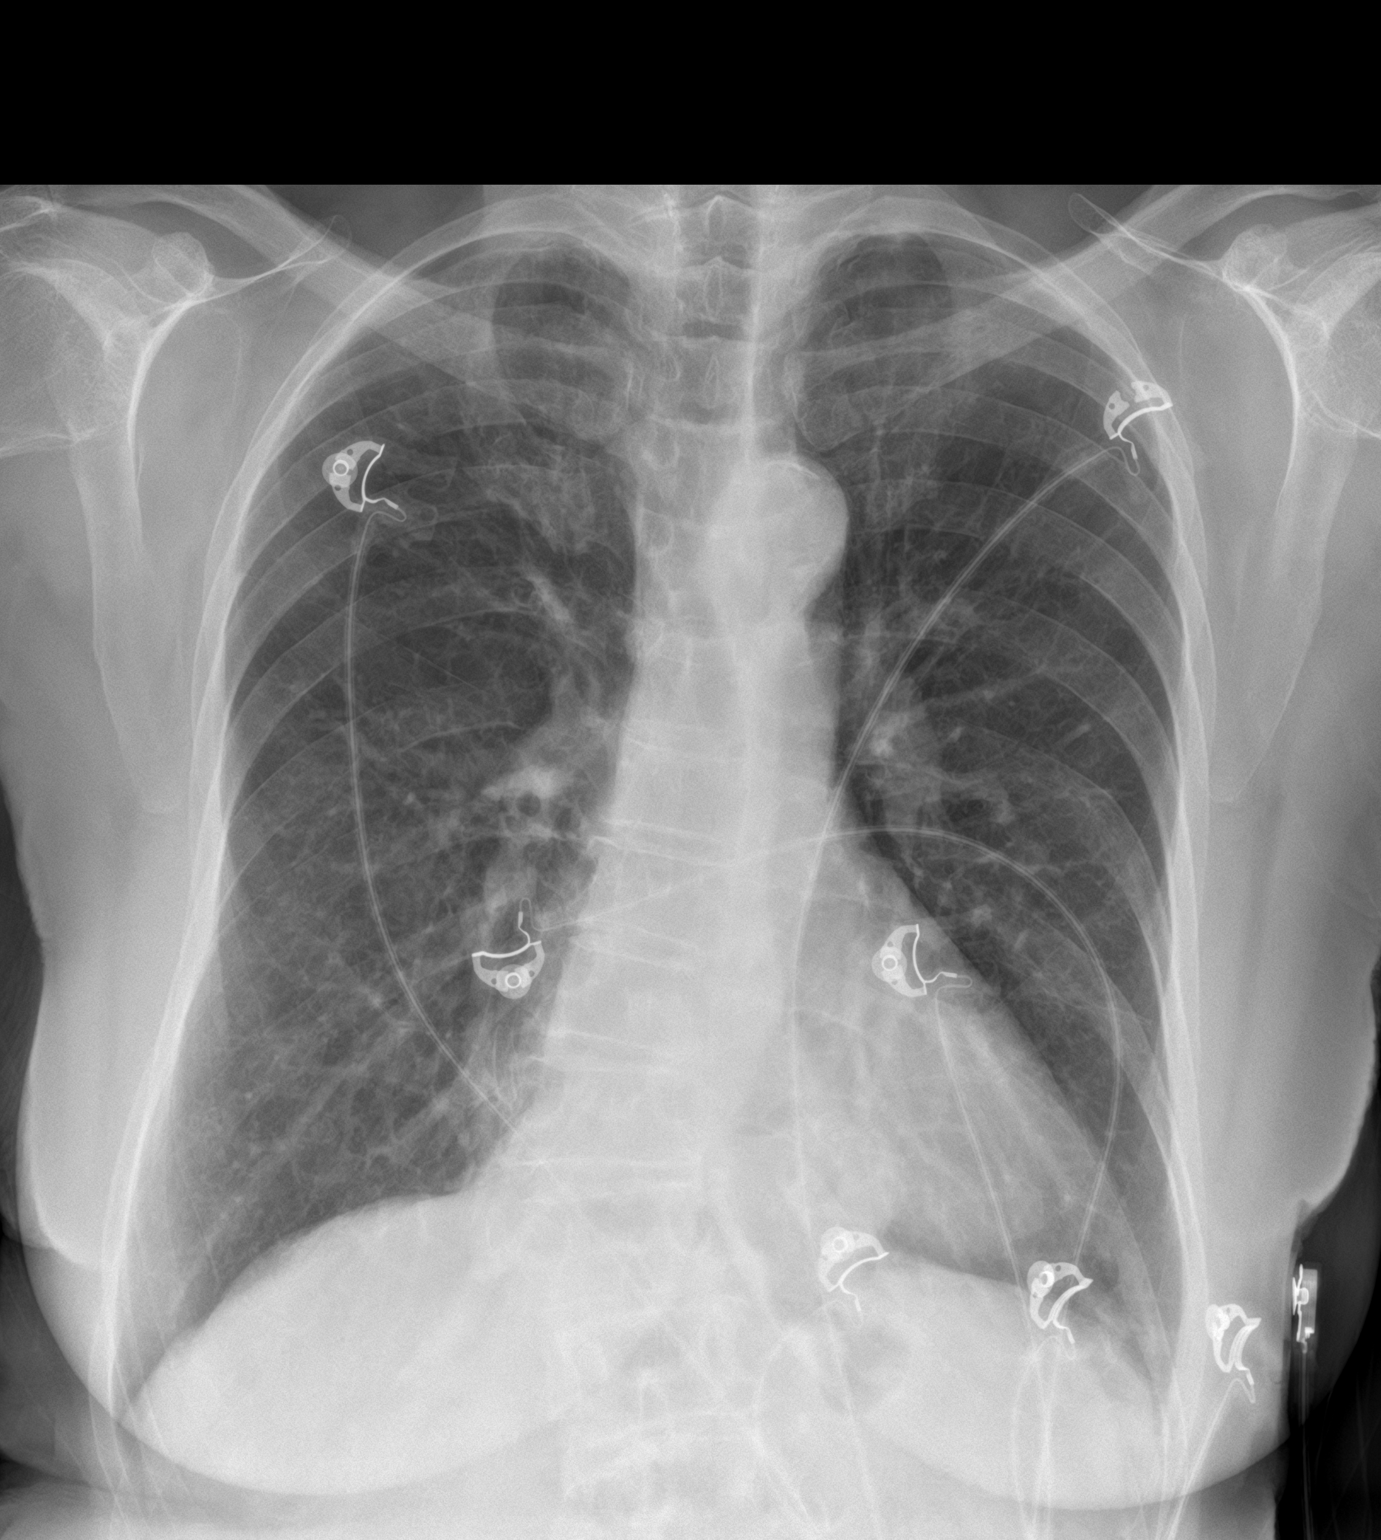

[chest lat]
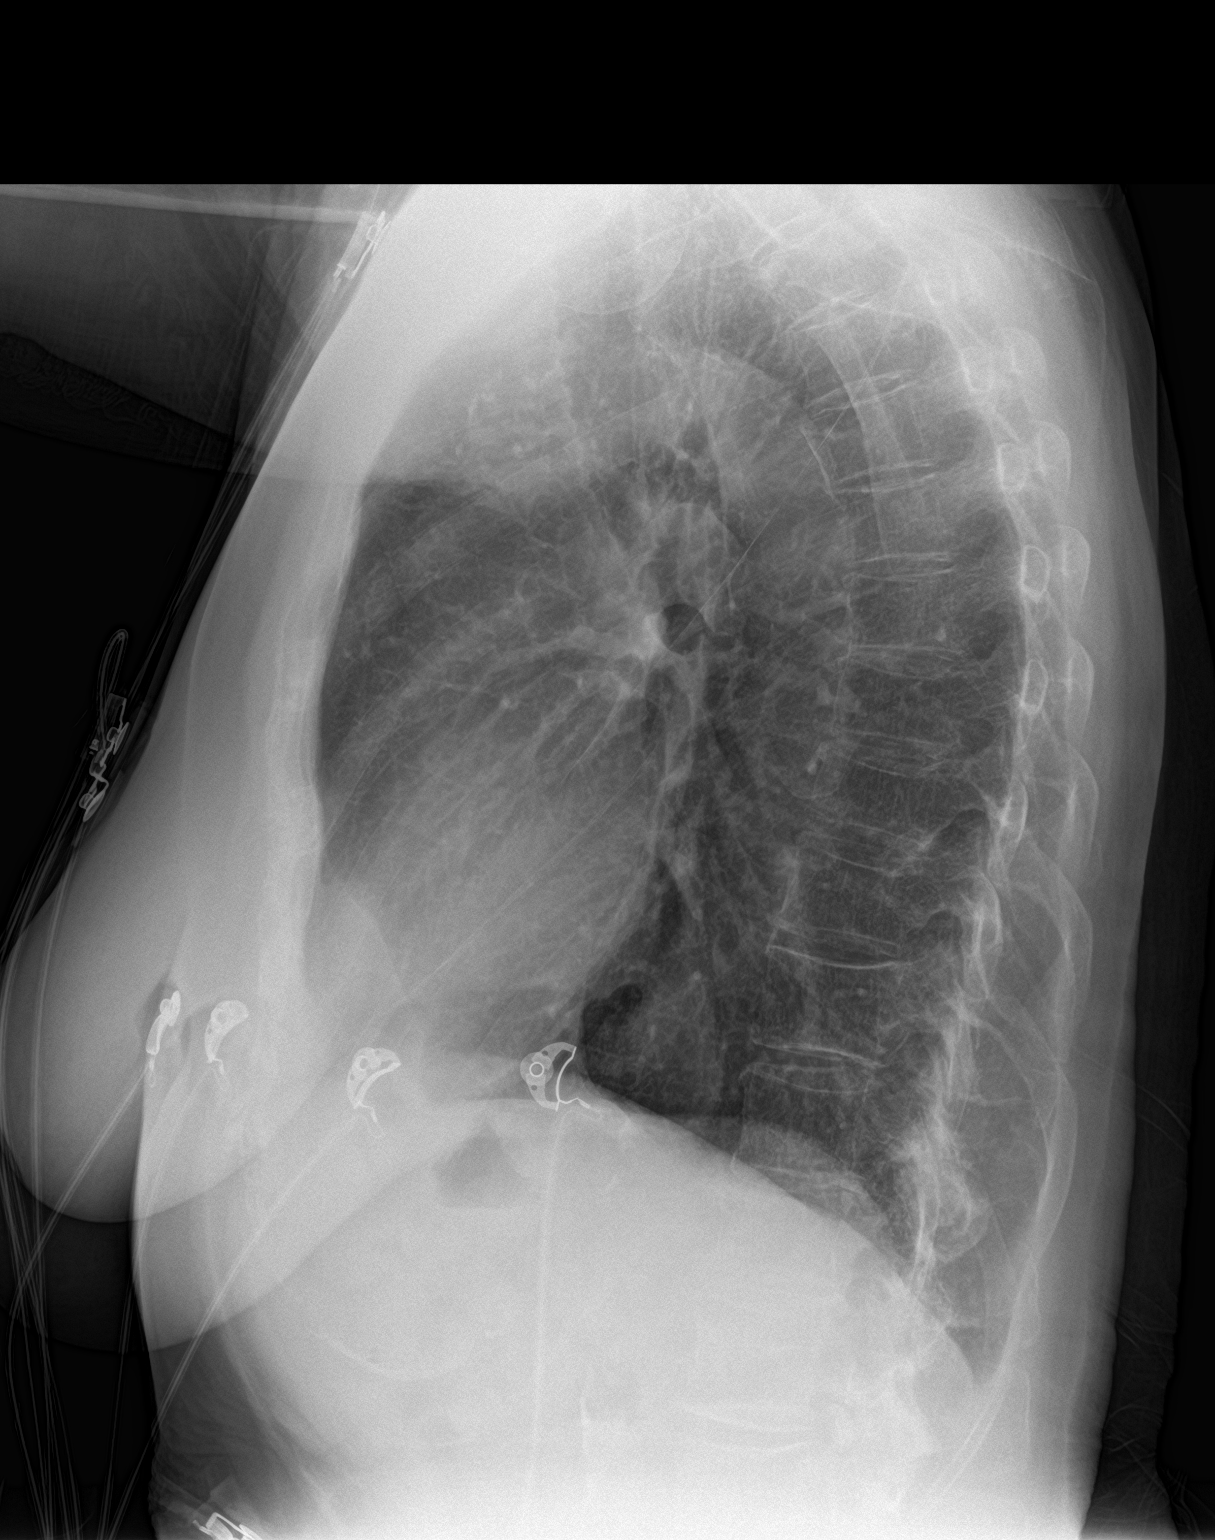

[2 of 2 positions shown; findings below may reference images not displayed]

FINDINGS: There is no edema or airspace opacity. Heart size and pulmonary
vascular normal. No adenopathy. There is a focal hiatal hernia.
There is thoracolumbar dextroscoliosis. There is aortic
atherosclerosis. Surgical clips in the thyroid region noted.
IMPRESSION: No edema or airspace opacity. Heart size normal. Hiatal hernia
present. Aortic Atherosclerosis ([id number]).

## 2022-06-21 ENCOUNTER — Ambulatory Visit: Payer: Medicare Other | Admitting: Physical Therapy

## 2022-06-21 DIAGNOSIS — G8929 Other chronic pain: Secondary | ICD-10-CM

## 2022-06-21 DIAGNOSIS — R279 Unspecified lack of coordination: Secondary | ICD-10-CM

## 2022-06-21 DIAGNOSIS — M62838 Other muscle spasm: Secondary | ICD-10-CM

## 2022-06-21 DIAGNOSIS — R278 Other lack of coordination: Secondary | ICD-10-CM

## 2022-06-21 DIAGNOSIS — M533 Sacrococcygeal disorders, not elsewhere classified: Secondary | ICD-10-CM

## 2022-06-21 DIAGNOSIS — R2689 Other abnormalities of gait and mobility: Secondary | ICD-10-CM

## 2022-06-21 NOTE — Therapy (Signed)
OUTPATIENT PHYSICAL THERAPY Treatment / Progress Note reporting from 02/14/22 across 10 visits  Patient Name: Kristi Maldonado MRN: [id number] DOB:September 17, 1940, 82 y.o., female Today's Date: 06/21/2022   PT End of Session - 06/21/22 1116     Visit Number 10    Number of Visits 17    Date for PT Re-Evaluation 07/03/22   eval 02/14/22   PT Start Time 1112    PT Stop Time 1150    PT Time Calculation (min) 38 min    Activity Tolerance Patient tolerated treatment well;No increased pain    Behavior During Therapy WFL for tasks assessed/performed             Past Medical History:  Diagnosis Date   Degenerative disc disease, lumbar    Difficult airway for intubation    Patient told by anesthesiology that she should tell future providers to always use a pediatric ET tube due to narrow airway and prior surgical issues including scarring   Difficult intubation    Narrow airway.  Needs pediatric tube   Hypertension    Hypothyroidism    Thyroid disease    Vertigo    x1   Wears dentures    partial lower   Past Surgical History:  Procedure Laterality Date   CATARACT EXTRACTION W/PHACO Left 01/20/2020   Procedure: CATARACT EXTRACTION PHACO AND INTRAOCULAR LENS PLACEMENT (IOC) LEFT 6.48 00:42.2;  Surgeon: Galen Manila, MD;  Location: [facility];  Service: Ophthalmology;  Laterality: Left;   CATARACT EXTRACTION W/PHACO Right 03/16/2020   Procedure: CATARACT EXTRACTION PHACO AND INTRAOCULAR LENS PLACEMENT (IOC) RIGHT;  Surgeon: Galen Manila, MD;  Location: [facility];  Service: Ophthalmology;  Laterality: Right;  6.11 0:43.5   SALIVARY GLAND SURGERY     THYROID SURGERY  2000   total vaginal hysterectomy, bilateral uterosacral ligament vaginal vault suspension, anterior colporrhaphy, perineorrhaphy, cystourethroscopy surgery on 08/11/2020  2022   TUBAL LIGATION     Patient Active Problem List   Diagnosis Date Noted   Allergic reaction 06/09/2017    DIVERTICULITIS, COLON 02/05/2009   IBS 02/05/2009    PCP: Bethann Punches MD   REFERRING PROVIDER: Doy Hutching MD   REFERRING DIAG: .3 (ICD-10-CM) - Stress incontinence (female) (female)   Rationale for Evaluation and Treatment Rehabilitation  THERAPY DIAG:  Other abnormalities of gait and mobility  Unspecified lack of coordination  Sacrococcygeal disorders, not elsewhere classified  Other lack of coordination  Other muscle spasm  Chronic low back pain with left-sided sciatica, unspecified back pain laterality  ONSET DATE:   SUBJECTIVE:            SUBJECTIVE STATEMENT Today:  Pt walked from parking lot and felt her claudication pain in the LLE was better today.  Pt contiues to walk the track for exercise which helps her claudication. Pt has not been able to get to the pool for exericse   Urinary urge incontinence has not occurred upon waking for the past few months.  L LBP has gotten worst since she has increased her activities. Pt will be seeing Dr. Yves Dill for a epidural shot for her LBP.    L neck pain is better and is not occurring as often.    Vacuuming and sweeping causes LBP and neck pain. Pt tries to use stool with reaching at low cabinets.    SUBJECTIVE STATEMENT on EVAL 02/14/22 : 1) urinary urge incontinence: Pt is not able to make it to the bathroom in time when her bladder is full and after  drinking tea. These episodes occur 3 x a week when she first wakes up. Pt wears a pad at night when sleeping so she is prepared for the leakage in the morning.    2) CLBP : occurs standing at sink with food preparation and doing dishes and it radiates to the L side by ( pt points to iliac crest L).  8-9/10.  Eases with stopping her activity and resting with heating pad or lidocain patch. LBP also occurs with ending.    3) left neck pain started two weeks ago. It came on gradually . No radiating down the arm. Pt has been sitting at the computer.  Pt completed PT HEP for neck and  low back customized for her scoliosis back in 2022.     PERTINENT HISTORY:  total vaginal hysterectomy, bilateral uterosacral ligament vaginal vault suspension, anterior colporrhaphy, perineorrhaphy, cystourethroscopy surgery on 06/08/202    claudication of BLE  ( L worse than R)  , LBP Pt jsut started a workout routine at the [facility] last week.  Pt has attended a class in the pool. Pt plans to use recumbent bike. Pt walks on the cushioned trail.    PAIN:  Are you having pain? No    PRECAUTIONS: None  WEIGHT BEARING RESTRICTIONS: No lifting weight > 50 lb from Dr. Yves Dill who treats her for her LBP   FALLS:  Has patient fallen in last 6 months? No  LIVING ENVIRONMENT: Lives with:  lives alone Lives in: House/apartment Stairs: No Has following equipment at home: None  OCCUPATION: retired   PLOF: Independent  PATIENT GOALS:   Get a workout routine that is safe for LBP and claudication of BLE and to make it to the toilet in time before leakage    OBJECTIVE:     [facility] PT Assessment - 06/21/22 2249         Lunges   Comments poor propioception of front leg/  adducted knee , poor WBing/ weight shift      Sit to Stand   Comments no adduction of knees      Other:   Other/ Comments simulated vacuuming/ sweeping : poor body mechanics  , minimal LKC for stability, straining back    Palpation: iliac crest levelled         therapeutic activities: motivational interviewing for compliance to HEP, explained body mechanics for vaccuuming/ sweeping   Neuro re-edu: cued for weight shift in lunge for vacuuming to minimize back strain, cued for less adduction of front knee, explained body mechanics     HOME EXERCISE PROGRAM: See pt instruction section    ASSESSMENT:  CLINICAL IMPRESSION:   Pelvic girdle remains levelled. Pt is able to walk regularly with improved management of claudication.        Motivational interviewing  applied to enhance compliance to HEP.  Explained body mechanics for vaccuuming/ sweeping. Pt required excessive   cues for weight shift in lunge for vacuuming to minimize back strain, cued for less adduction of front knee, explained body mechanics.        Pt showed good carry over with past HEP for hip strengthening and thoracolumbar strengthening. Pt still requires propioception and body        awareness training at upcoming sessions.     Internal pelvic floor assessment at upcoming sessions to minimize relapse of prolapse Sx 2/2 hysterectomy.  Plan to consolidate scoliosis specific HEP which will help with minimizing back pain.  Pt benefits from skilled PT.  OBJECTIVE IMPAIRMENTS decreased activity tolerance, decreased coordination, decreased endurance, decreased mobility, difficulty walking, decreased ROM, decreased strength, decreased safety awareness, hypomobility, increased muscle spasms, impaired flexibility, improper body mechanics, postural dysfunction, and pain. scar restrictions   ACTIVITY LIMITATIONS  self-care,  sleep, home chores, work tasks    PARTICIPATION LIMITATIONS:  community, gym activities    PERSONAL FACTORS  Prolapse surgery is also affecting patient's functional outcome.    REHAB POTENTIAL: Good   CLINICAL DECISION MAKING: Evolving/moderate complexity   EVALUATION COMPLEXITY: Moderate    PATIENT EDUCATION:    Education details: Showed pt anatomy images. Explained muscles attachments/ connection, physiology of deep core system/ spinal- thoracic-pelvis-lower kinetic chain as they relate to pt's presentation, Sx, and past Hx. Explained what and how these areas of deficits need to be restored to balance and function    See Therapeutic activity / neuromuscular re-education section  Answered pt's questions.   Person educated: Patient Education method: Explanation, Demonstration, Tactile cues, Verbal cues, and Handouts Education comprehension: verbalized understanding, returned demonstration, verbal  cues required, tactile cues required, and needs further education     PLAN: PT FREQUENCY: 1x/week   PT DURATION: 10 weeks   PLANNED INTERVENTIONS: Therapeutic exercises, Therapeutic activity, Neuromuscular re-education, Balance training, Gait training, Patient/Family education, Self Care, Joint mobilization, Spinal mobilization, Moist heat, Taping, and Manual therapy, dry needling.   PLAN FOR NEXT SESSION: See clinical impression for plan     GOALS: Goals reviewed with patient? Yes  SHORT TERM GOALS: Target date: 03/14/2022    Pt will demo IND with HEP                    Baseline: Not IND            Goal status:MET   LONG TERM GOALS: Target date: 04/25/2022    1.Pt will demo proper deep core coordination without chest breathing and optimal excursion of diaphragm/pelvic floor in order to promote spinal stability and pelvic floor function  Baseline: dyscoordination Goal status: MET  2.  Pt will demo > 5 pt change on FOTO  to improve QOL and function  PFDI Urinary baseline - 46  --->  54 pts  Urinary Problem baseline- 53  --> 58 pts  Lumber baseline  -  50  --> 47 pts   Goal status" On going   3.  Pt will demo proper body mechanics in against gravity tasks and ADLs  work tasks, fitness  to minimize straining pelvic floor / back                  Baseline: not IND, improper form that places strain on pelvic floor                Goal status: On going     4. Pt is able to make it to the bathroom in time when her bladder is full and after drinking tea and experience decreased episodes of leakage before making it to the toilet from 3 x week to < 1 x week in order to improve QOL . Baseline: These episodes occur 3 x a week when she first wakes up. Goal status: MET     5. Pt will report being able to stand at sink with food preparation and doing dishes > 20 min and have no radiating pain  to the L side  to iliac crest L) in order to perform ADLs  Baseline: Pain 8-9/10 with  these activities after 20 min.  Eases with stopping her activity and resting with heating pad or lidocain patch  Goal status: MET  ( no more radiating down the leg, only at the hip)    6. Pt will demo levelled pelvic girdle and shoulder height in order to progress to deep core strengthening HEP and restore mobility at spine, pelvis, gait, posture   Baseline: R iliac crest , R shoulder higher  Goal status: MET   7. Pt will demo increased hamstring flexibility ( knee ext deg in 90-90 position on her back) to > 160 deg in order to increase walking from > 6 min without L calf pain to improve mobility and aerobic health.    Baseline: 90-90  position: hamstring mobility: 140 deg L, 150 deg R                          Goal status: MET ( 06/07/22: 55 deg B)   Mariane Masters, PT 06/21/2022, 11:17 AM

## 2022-06-28 ENCOUNTER — Other Ambulatory Visit: Payer: Self-pay | Admitting: Internal Medicine

## 2022-06-28 DIAGNOSIS — Z1231 Encounter for screening mammogram for malignant neoplasm of breast: Secondary | ICD-10-CM

## 2022-07-05 ENCOUNTER — Ambulatory Visit: Payer: Medicare Other | Admitting: Physical Therapy

## 2022-07-05 ENCOUNTER — Encounter: Payer: Medicare Other | Admitting: Physical Therapy

## 2022-07-18 ENCOUNTER — Encounter: Payer: Medicare Other | Admitting: Physical Therapy

## 2022-07-19 ENCOUNTER — Ambulatory Visit: Payer: Medicare Other | Attending: Obstetrics and Gynecology | Admitting: Physical Therapy

## 2022-07-19 DIAGNOSIS — M5442 Lumbago with sciatica, left side: Secondary | ICD-10-CM | POA: Insufficient documentation

## 2022-07-19 DIAGNOSIS — R279 Unspecified lack of coordination: Secondary | ICD-10-CM | POA: Diagnosis present

## 2022-07-19 DIAGNOSIS — R278 Other lack of coordination: Secondary | ICD-10-CM

## 2022-07-19 DIAGNOSIS — M62838 Other muscle spasm: Secondary | ICD-10-CM | POA: Diagnosis present

## 2022-07-19 DIAGNOSIS — R2689 Other abnormalities of gait and mobility: Secondary | ICD-10-CM

## 2022-07-19 DIAGNOSIS — G8929 Other chronic pain: Secondary | ICD-10-CM | POA: Diagnosis present

## 2022-07-19 DIAGNOSIS — M533 Sacrococcygeal disorders, not elsewhere classified: Secondary | ICD-10-CM

## 2022-07-19 NOTE — Patient Instructions (Signed)
   Lengthen Back rib by R  shoulder   ( winging)    Lie on L  side , pillow between knees and under head  Pull  R arm overhead over mattress, grab the edge of mattress,pull it upward, drawing elbow away from ears  Breathing 10 reps  Brushing arm with 3/4 turn onto pillow behind back  Lying on L  side ,Pillow/ Block between knees     dragging top forearm across ribs below breast rotating 3/4 turn,  rotating  _R_ only this week ,  relax onto the pillow behind the back  and then back to other palm , maintain top palm on body whole top and not lift shoulder  __   Reclined twist for hips and side of the hips/ legs  Lay on your back, knees bend Scoot hips to the R , leave shoulders in place Wobble knees to the L side 45 deg and to midline  10 reps   __   Wall stretch with R arm up

## 2022-07-19 NOTE — Therapy (Unsigned)
OUTPATIENT PHYSICAL THERAPY Treatment    Patient Name: Kristi Maldonado MRN: [id number] DOB:05-Feb-1941, 82 y.o., female Today's Date: 07/19/2022   PT End of Session - 07/19/22 1107     Visit Number 11    Number of Visits 17    Date for PT Re-Evaluation 08/30/22   eval 02/14/22   PT Start Time 1103    PT Stop Time 1145    PT Time Calculation (min) 42 min    Activity Tolerance Patient tolerated treatment well;No increased pain    Behavior During Therapy WFL for tasks assessed/performed             Past Medical History:  Diagnosis Date   Degenerative disc disease, lumbar    Difficult airway for intubation    Patient told by anesthesiology that she should tell future providers to always use a pediatric ET tube due to narrow airway and prior surgical issues including scarring   Difficult intubation    Narrow airway.  Needs pediatric tube   Hypertension    Hypothyroidism    Thyroid disease    Vertigo    x1   Wears dentures    partial lower   Past Surgical History:  Procedure Laterality Date   CATARACT EXTRACTION W/PHACO Left 01/20/2020   Procedure: CATARACT EXTRACTION PHACO AND INTRAOCULAR LENS PLACEMENT (IOC) LEFT 6.48 00:42.2;  Surgeon: Galen Manila, MD;  Location: [facility];  Service: Ophthalmology;  Laterality: Left;   CATARACT EXTRACTION W/PHACO Right 03/16/2020   Procedure: CATARACT EXTRACTION PHACO AND INTRAOCULAR LENS PLACEMENT (IOC) RIGHT;  Surgeon: Galen Manila, MD;  Location: [facility];  Service: Ophthalmology;  Laterality: Right;  6.11 0:43.5   SALIVARY GLAND SURGERY     THYROID SURGERY  2000   total vaginal hysterectomy, bilateral uterosacral ligament vaginal vault suspension, anterior colporrhaphy, perineorrhaphy, cystourethroscopy surgery on 08/11/2020  2022   TUBAL LIGATION     Patient Active Problem List   Diagnosis Date Noted   Allergic reaction 06/09/2017   DIVERTICULITIS, COLON 02/05/2009   IBS 02/05/2009    PCP: Bethann Punches MD   REFERRING PROVIDER: Doy Hutching MD   REFERRING DIAG: .3 (ICD-10-CM) - Stress incontinence (female) (female)   Rationale for Evaluation and Treatment Rehabilitation  THERAPY DIAG:  Other abnormalities of gait and mobility  Unspecified lack of coordination  Sacrococcygeal disorders, not elsewhere classified  Other lack of coordination  Other muscle spasm  Chronic low back pain with left-sided sciatica, unspecified back pain laterality  ONSET DATE:   SUBJECTIVE:            SUBJECTIVE STATEMENT Today:  She has been doing better with LBP. Pt has been doing water aerobics.  Pt has been clamshell HEP.  Pt has been using the technique with vacuuming and sweeping . Pt still continues to walk which helps her claudication.    SUBJECTIVE STATEMENT on EVAL 02/14/22 : 1) urinary urge incontinence: Pt is not able to make it to the bathroom in time when her bladder is full and after drinking tea. These episodes occur 3 x a week when she first wakes up. Pt wears a pad at night when sleeping so she is prepared for the leakage in the morning.    2) CLBP : occurs standing at sink with food preparation and doing dishes and it radiates to the L side by ( pt points to iliac crest L).  8-9/10.  Eases with stopping her activity and resting with heating pad or lidocain patch. LBP also occurs with  ending.    3) left neck pain started two weeks ago. It came on gradually . No radiating down the arm. Pt has been sitting at the computer.  Pt completed PT HEP for neck and low back customized for her scoliosis back in 2022.     PERTINENT HISTORY:  total vaginal hysterectomy, bilateral uterosacral ligament vaginal vault suspension, anterior colporrhaphy, perineorrhaphy, cystourethroscopy surgery on 06/08/202    claudication of BLE  ( L worse than R)  , LBP Pt jsut started a workout routine at the [facility] last week.  Pt has attended a class in the pool. Pt plans to use recumbent bike. Pt walks on the  cushioned trail.    PAIN:  Are you having pain? No    PRECAUTIONS: None  WEIGHT BEARING RESTRICTIONS: No lifting weight > 50 lb from Dr. Yves Dill who treats her for her LBP   FALLS:  Has patient fallen in last 6 months? No  LIVING ENVIRONMENT: Lives with:  lives alone Lives in: House/apartment Stairs: No Has following equipment at home: None  OCCUPATION: retired   PLOF: Independent  PATIENT GOALS:   Get a workout routine that is safe for LBP and claudication of BLE and to make it to the toilet in time before leakage    OBJECTIVE:     HOME EXERCISE PROGRAM: See pt instruction section    ASSESSMENT:  CLINICAL IMPRESSION:   Pelvic girdle remains levelled. Pt is able to walk regularly with improved management of claudication.            Internal pelvic floor assessment at upcoming sessions to minimize relapse of prolapse Sx 2/2 hysterectomy.  Plan to consolidate scoliosis specific HEP which will help with minimizing back pain.  Pt benefits from skilled PT.    OBJECTIVE IMPAIRMENTS decreased activity tolerance, decreased coordination, decreased endurance, decreased mobility, difficulty walking, decreased ROM, decreased strength, decreased safety awareness, hypomobility, increased muscle spasms, impaired flexibility, improper body mechanics, postural dysfunction, and pain. scar restrictions   ACTIVITY LIMITATIONS  self-care,  sleep, home chores, work tasks    PARTICIPATION LIMITATIONS:  community, gym activities    PERSONAL FACTORS  Prolapse surgery is also affecting patient's functional outcome.    REHAB POTENTIAL: Good   CLINICAL DECISION MAKING: Evolving/moderate complexity   EVALUATION COMPLEXITY: Moderate    PATIENT EDUCATION:    Education details: Showed pt anatomy images. Explained muscles attachments/ connection, physiology of deep core system/ spinal- thoracic-pelvis-lower kinetic chain as they relate to pt's presentation, Sx, and past Hx. Explained  what and how these areas of deficits need to be restored to balance and function    See Therapeutic activity / neuromuscular re-education section  Answered pt's questions.   Person educated: Patient Education method: Explanation, Demonstration, Tactile cues, Verbal cues, and Handouts Education comprehension: verbalized understanding, returned demonstration, verbal cues required, tactile cues required, and needs further education     PLAN: PT FREQUENCY: 1x/week   PT DURATION: 10 weeks   PLANNED INTERVENTIONS: Therapeutic exercises, Therapeutic activity, Neuromuscular re-education, Balance training, Gait training, Patient/Family education, Self Care, Joint mobilization, Spinal mobilization, Moist heat, Taping, and Manual therapy, dry needling.   PLAN FOR NEXT SESSION: See clinical impression for plan     GOALS: Goals reviewed with patient? Yes  SHORT TERM GOALS: Target date: 03/14/2022    Pt will demo IND with HEP                    Baseline: Not IND  Goal status:MET   LONG TERM GOALS: Target date: 04/25/2022    1.Pt will demo proper deep core coordination without chest breathing and optimal excursion of diaphragm/pelvic floor in order to promote spinal stability and pelvic floor function  Baseline: dyscoordination Goal status: MET  2.  Pt will demo > 5 pt change on FOTO  to improve QOL and function  PFDI Urinary baseline - 46  --->  54 pts  Urinary Problem baseline- 53  --> 58 pts  Lumber baseline  -  50  --> 47 pts   Goal status" On going   3.  Pt will demo proper body mechanics in against gravity tasks and ADLs  work tasks, fitness  to minimize straining pelvic floor / back                  Baseline: not IND, improper form that places strain on pelvic floor                Goal status: On going     4. Pt is able to make it to the bathroom in time when her bladder is full and after drinking tea and experience decreased episodes of leakage before making  it to the toilet from 3 x week to < 1 x week in order to improve QOL . Baseline: These episodes occur 3 x a week when she first wakes up. Goal status: MET     5. Pt will report being able to stand at sink with food preparation and doing dishes > 20 min and have no radiating pain  to the L side  to iliac crest L) in order to perform ADLs  Baseline: Pain 8-9/10 with these activities after 20 min.   Eases with stopping her activity and resting with heating pad or lidocain patch  Goal status: MET  ( no more radiating down the leg, only at the hip)    6. Pt will demo levelled pelvic girdle and shoulder height in order to progress to deep core strengthening HEP and restore mobility at spine, pelvis, gait, posture   Baseline: R iliac crest , R shoulder higher  Goal status: MET   7. Pt will demo increased hamstring flexibility ( knee ext deg in 90-90 position on her back) to > 160 deg in order to increase walking from > 6 min without L calf pain to improve mobility and aerobic health.    Baseline: 90-90  position: hamstring mobility: 140 deg L, 150 deg R                          Goal status: MET ( 06/07/22: 55 deg B)   Mariane Masters, PT 07/19/2022, 11:07 AM

## 2022-08-15 ENCOUNTER — Ambulatory Visit
Admission: RE | Admit: 2022-08-15 | Discharge: 2022-08-15 | Disposition: A | Discharge status: Home / Self Care | Payer: Medicare Other | Source: Ambulatory Visit | Attending: Internal Medicine | Admitting: Internal Medicine

## 2022-08-15 ENCOUNTER — Ambulatory Visit: Payer: Medicare Other | Attending: Obstetrics and Gynecology | Admitting: Physical Therapy

## 2022-08-15 DIAGNOSIS — M5442 Lumbago with sciatica, left side: Secondary | ICD-10-CM | POA: Insufficient documentation

## 2022-08-15 DIAGNOSIS — Z1231 Encounter for screening mammogram for malignant neoplasm of breast: Secondary | ICD-10-CM | POA: Insufficient documentation

## 2022-08-15 DIAGNOSIS — R279 Unspecified lack of coordination: Secondary | ICD-10-CM | POA: Insufficient documentation

## 2022-08-15 DIAGNOSIS — R278 Other lack of coordination: Secondary | ICD-10-CM | POA: Insufficient documentation

## 2022-08-15 DIAGNOSIS — M533 Sacrococcygeal disorders, not elsewhere classified: Secondary | ICD-10-CM | POA: Insufficient documentation

## 2022-08-15 DIAGNOSIS — G8929 Other chronic pain: Secondary | ICD-10-CM | POA: Insufficient documentation

## 2022-08-15 DIAGNOSIS — M62838 Other muscle spasm: Secondary | ICD-10-CM | POA: Insufficient documentation

## 2022-08-15 DIAGNOSIS — R2689 Other abnormalities of gait and mobility: Secondary | ICD-10-CM | POA: Insufficient documentation

## 2022-08-15 NOTE — Therapy (Signed)
OUTPATIENT PHYSICAL THERAPY Discharge Summary across 11 visits    Patient Name: Kristi Maldonado MRN: [id number] DOB:1940/08/13, 82 y.o., female Today's Date: 08/15/2022     Past Medical History:  Diagnosis Date   Degenerative disc disease, lumbar    Difficult airway for intubation    Patient told by anesthesiology that she should tell future providers to always use a pediatric ET tube due to narrow airway and prior surgical issues including scarring   Difficult intubation    Narrow airway.  Needs pediatric tube   Hypertension    Hypothyroidism    Thyroid disease    Vertigo    x1   Wears dentures    partial lower   Past Surgical History:  Procedure Laterality Date   CATARACT EXTRACTION W/PHACO Left 01/20/2020   Procedure: CATARACT EXTRACTION PHACO AND INTRAOCULAR LENS PLACEMENT (IOC) LEFT 6.48 00:42.2;  Surgeon: Galen Manila, MD;  Location: [facility];  Service: Ophthalmology;  Laterality: Left;   CATARACT EXTRACTION W/PHACO Right 03/16/2020   Procedure: CATARACT EXTRACTION PHACO AND INTRAOCULAR LENS PLACEMENT (IOC) RIGHT;  Surgeon: Galen Manila, MD;  Location: [facility];  Service: Ophthalmology;  Laterality: Right;  6.11 0:43.5   SALIVARY GLAND SURGERY     THYROID SURGERY  2000   total vaginal hysterectomy, bilateral uterosacral ligament vaginal vault suspension, anterior colporrhaphy, perineorrhaphy, cystourethroscopy surgery on 08/11/2020  2022   TUBAL LIGATION     Patient Active Problem List   Diagnosis Date Noted   Allergic reaction 06/09/2017   DIVERTICULITIS, COLON 02/05/2009   IBS 02/05/2009    PCP: Bethann Punches MD   REFERRING PROVIDER: Doy Hutching MD   REFERRING DIAG: .3 (ICD-10-CM) - Stress incontinence (female) (female)   Rationale for Evaluation and Treatment Rehabilitation  THERAPY DIAG:  No diagnosis found.  ONSET DATE:   CLINICAL IMPRESSION:  Pt has met 100% of her goals.   LBP score and Pelvic urinary scores on FOTO have  significantly improved. Pt was able to travel to see family out of state and also perform vaccuuming and chores without LBP pain and minimal urinary issues.   Pt has been maintaining her scoliosisHEP and deep core HEP which is helping with her Sx. Pt is also maintaining walking and pool classes which is helping with her claudication Sx.   Pt is ready for d/c.   OBJECTIVE IMPAIRMENTS decreased activity tolerance, decreased coordination, decreased endurance, decreased mobility, difficulty walking, decreased ROM, decreased strength, decreased safety awareness, hypomobility, increased muscle spasms, impaired flexibility, improper body mechanics, postural dysfunction, and pain. scar restrictions   ACTIVITY LIMITATIONS  self-care,  sleep, home chores, work tasks    PARTICIPATION LIMITATIONS:  community, gym activities    PERSONAL FACTORS  Prolapse surgery is also affecting patient's functional outcome.    REHAB POTENTIAL: Good   CLINICAL DECISION MAKING: Evolving/moderate complexity   EVALUATION COMPLEXITY: Moderate    PATIENT EDUCATION:    Education details: Showed pt anatomy images. Explained muscles attachments/ connection, physiology of deep core system/ spinal- thoracic-pelvis-lower kinetic chain as they relate to pt's presentation, Sx, and past Hx. Explained what and how these areas of deficits need to be restored to balance and function    See Therapeutic activity / neuromuscular re-education section  Answered pt's questions.   Person educated: Patient Education method: Explanation, Demonstration, Tactile cues, Verbal cues, and Handouts Education comprehension: verbalized understanding, returned demonstration, verbal cues required, tactile cues required, and needs further education     PLAN: PT FREQUENCY: 1x/week   PT DURATION: 10  weeks   PLANNED INTERVENTIONS: Therapeutic exercises, Therapeutic activity, Neuromuscular re-education, Balance training, Gait training,  Patient/Family education, Self Care, Joint mobilization, Spinal mobilization, Moist heat, Taping, and Manual therapy, dry needling.   PLAN FOR NEXT SESSION: See clinical impression for plan     GOALS: Goals reviewed with patient? Yes  SHORT TERM GOALS: Target date: 03/14/2022    Pt will demo IND with HEP                    Baseline: Not IND            Goal status:MET   LONG TERM GOALS: Target date: 04/25/2022    1.Pt will demo proper deep core coordination without chest breathing and optimal excursion of diaphragm/pelvic floor in order to promote spinal stability and pelvic floor function  Baseline: dyscoordination Goal status: MET  2.  Pt will demo > 5 pt change on FOTO  to improve QOL and function  PFDI Urinary baseline - 46  --->  54 pts  ( 08/15/22: 0 pts)   Urinary Problem baseline- 53  --> 58 pts  (  08/15/22: 72 pts)   Lumber baseline  -  50  --> 47 pts   ( 08/15/22: 88 pts)   Goal status : MET   3.  Pt will demo proper body mechanics in against gravity tasks and ADLs  work tasks, fitness  to minimize straining pelvic floor / back                  Baseline: not IND, improper form that places strain on pelvic floor                Goal status: MET     4. Pt is able to make it to the bathroom in time when her bladder is full and after drinking tea and experience decreased episodes of leakage before making it to the toilet from 3 x week to < 1 x week in order to improve QOL . Baseline: These episodes occur 3 x a week when she first wakes up. Goal status: MET     5. Pt will report being able to stand at sink with food preparation and doing dishes > 20 min and have no radiating pain  to the L side  to iliac crest L) in order to perform ADLs  Baseline: Pain 8-9/10 with these activities after 20 min.   Eases with stopping her activity and resting with heating pad or lidocain patch  Goal status: MET  ( no more radiating down the leg, only at the hip)    6. Pt will demo  levelled pelvic girdle and shoulder height in order to progress to deep core strengthening HEP and restore mobility at spine, pelvis, gait, posture   Baseline: R iliac crest , R shoulder higher  Goal status: MET   7. Pt will demo increased hamstring flexibility ( knee ext deg in 90-90 position on her back) to > 160 deg in order to increase walking from > 6 min without L calf pain to improve mobility and aerobic health.    Baseline: 90-90  position: hamstring mobility: 140 deg L, 150 deg R                          Goal status: MET ( 06/07/22: 55 deg B)   Mariane Masters, PT 08/15/2022, 5:05 PM

## 2022-12-27 ENCOUNTER — Ambulatory Visit: Payer: Medicare Other | Admitting: Dermatology

## 2022-12-27 ENCOUNTER — Encounter: Payer: Self-pay | Admitting: Dermatology

## 2022-12-27 DIAGNOSIS — L601 Onycholysis: Secondary | ICD-10-CM

## 2022-12-27 DIAGNOSIS — D489 Neoplasm of uncertain behavior, unspecified: Secondary | ICD-10-CM

## 2022-12-27 MED ORDER — FLUCONAZOLE 150 MG PO TABS: 150.0000 mg | ORAL_TABLET | ORAL | 0 refills | Status: DC

## 2022-12-27 NOTE — Progress Notes (Unsigned)
   Follow-Up Visit   Subjective  Kristi Maldonado is a 82 y.o. female who presents for the following: discoloration at 4th and 5th fingernails on both hands, also more brittle than other nails. Started a little over a week ago. Patient does not garden, has not used anything new on her hands, no hx of psoriasis. Patient does not do a lot of dishes, no recent illnesses, no rashes, no new medications, toenails unaffected. Patient did have manicure around 9/19 and left the polish on for about 1 week, no gel.   The patient has spots, moles and lesions to be evaluated, some may be new or changing and the patient may have concern these could be cancer.   The following portions of the chart were reviewed this encounter and updated as appropriate: medications, allergies, medical history  Review of Systems:  No other skin or systemic complaints except as noted in HPI or Assessment and Plan.  Objective  Well appearing patient in no apparent distress; mood and affect are within normal limits.   A focused examination was performed of the following areas: Hands, fingernails, arms, abdomen, back, knees, oral cavity  Relevant exam findings are noted in the Assessment and Plan.  Left 4th Fingernail Thinning of nail plates and distal onycholysis of bilateral 4th 5th fingernails R/o onychomycosis     Assessment & Plan           ONYCHOLYSIS Exam:   Treatment Plan: Benign-appearing.  Observation.  Patient deferred treatment at this time.  Neoplasm of uncertain behavior Left 4th Fingernail  Skin / nail biopsy  Informed consent: discussed and consent obtained   Timeout: patient name, date of birth, surgical site, and procedure verified   Outcome: patient tolerated procedure well   Additional details:  Nail clipping with nail clippers  Specimen 1 - Surgical pathology Differential Diagnosis: R/o onychomycosis  Check Margins: No Thinning of nail plates of bilateral 4th 5th  fingernails   Differential includes infectious vs inflammatory vs drug-induced onycholysis Bilateral nature raises concern for inflammatory. No cutaneous evidence of lichen planus or psoriasis. Difficult to diagnose inflammatory onycholysis with nail biopsy and nail will be dystrophic permanently No culprit drugs Rule out candidal onycholysis with sampling  Biopsy sent from nail clipping, no wound care necessary Sample sent for fungal culture Discussed empiric fluconazole vs monitoring. Patient opts for fluconazole. Start 150 mg weekly   Onycholysis  Related Procedures Culture, Fungus with Smear  Related Medications fluconazole (DIFLUCAN) 150 MG tablet Take 1 tablet (150 mg total) by mouth once a week.    Return in about 4 weeks (around 01/24/2023).  Anise Salvo, RMA, am acting as scribe for Elie Goody, MD .   Documentation: I have reviewed the above documentation for accuracy and completeness, and I agree with the above.  Elie Goody, MD

## 2022-12-27 NOTE — Patient Instructions (Addendum)
Side effects of fluconazole (diflucan) include nausea, diarrhea, headache, dizziness, taste changes, rare risk of irritation of the liver, allergy, or decreased blood counts (which could show up as infection or tiredness).   Due to recent changes in healthcare laws, you may see results of your pathology and/or laboratory studies on MyChart before the doctors have had a chance to review them. We understand that in some cases there may be results that are confusing or concerning to you. Please understand that not all results are received at the same time and often the doctors may need to interpret multiple results in order to provide you with the best plan of care or course of treatment. Therefore, we ask that you please give [place] 2 business days to thoroughly review all your results before contacting the office for clarification. Should we see a critical lab result, you will be contacted sooner.   If You Need Anything After Your Visit  If you have any questions or concerns for your doctor, please call our main line at [phone] and press option 4 to reach your doctor's medical assistant. If no one answers, please leave a voicemail as directed and we will return your call as soon as possible. Messages left after 4 pm will be answered the following business day.   You may also send [place] a message via MyChart. We typically respond to MyChart messages within 1-2 business days.  For prescription refills, please ask your pharmacy to contact our office. Our fax number is [phone].  If you have an urgent issue when the clinic is closed that cannot wait until the next business day, you can page your doctor at the number below.    Please note that while we do our best to be available for urgent issues outside of office hours, we are not available 24/7.   If you have an urgent issue and are unable to reach [place], you may choose to seek medical care at your doctor's office, retail clinic, urgent care center, or  emergency room.  If you have a medical emergency, please immediately call 911 or go to the emergency department.  Pager Numbers  - Dr. Gwen Pounds: [phone]  - Dr. Roseanne Reno: [phone]  - Dr. Katrinka Blazing: [phone]   In the event of inclement weather, please call our main line at [phone] for an update on the status of any delays or closures.  Dermatology Medication Tips: Please keep the boxes that topical medications come in in order to help keep track of the instructions about where and how to use these. Pharmacies typically print the medication instructions only on the boxes and not directly on the medication tubes.   If your medication is too expensive, please contact our office at [phone] option 4 or send [place] a message through MyChart.   We are unable to tell what your co-pay for medications will be in advance as this is different depending on your insurance coverage. However, we may be able to find a substitute medication at lower cost or fill out paperwork to get insurance to cover a needed medication.   If a prior authorization is required to get your medication covered by your insurance company, please allow [place] 1-2 business days to complete this process.  Drug prices often vary depending on where the prescription is filled and some pharmacies may offer cheaper prices.  The website www.goodrx.com contains coupons for medications through different pharmacies. The prices here do not account for what the cost may be with help from  insurance (it may be cheaper with your insurance), but the website can give you the price if you did not use any insurance.  - You can print the associated coupon and take it with your prescription to the pharmacy.  - You may also stop by our office during regular business hours and pick up a GoodRx coupon card.  - If you need your prescription sent electronically to a different pharmacy, notify our office through [facility] or by phone at  [phone] option 4.     Si Usted Necesita Algo Despus de Su Visita  Tambin puede enviarnos un mensaje a travs de Clinical cytogeneticist. Por lo general respondemos a los mensajes de MyChart en el transcurso de 1 a 2 das hbiles.  Para renovar recetas, por favor pida a su farmacia que se ponga en contacto con nuestra oficina. Annie Sable de fax es [place] [phone].  Si tiene un asunto urgente cuando la clnica est cerrada y que no puede esperar hasta el siguiente da hbil, puede llamar/localizar a su doctor(a) al nmero que aparece a continuacin.   Por favor, tenga en cuenta que aunque hacemos todo lo posible para estar disponibles para asuntos urgentes fuera del horario de [place], no estamos disponibles las 24 horas del da, los 7 [address]  [address] de la [place].   Si tiene un problema urgente y no puede comunicarse con nosotros, puede optar por buscar atencin mdica  en el consultorio de su doctor(a), en una clnica privada, en un centro de atencin urgente o en una sala de emergencias.  Si tiene Engineer, drilling, por favor llame inmediatamente al 911 o vaya a la sala de emergencias.  Nmeros de bper  - Dr. Gwen Pounds: [phone]  - Dra. Roseanne Reno: [phone]  - Dr. Katrinka Blazing: [phone]   En caso de inclemencias del tiempo, por favor llame a Lacy Duverney principal al [phone] para una actualizacin sobre el [place] de cualquier retraso o cierre.  Consejos para la medicacin en dermatologa: Por favor, guarde las cajas en las que vienen los medicamentos de uso tpico para ayudarle a seguir las instrucciones sobre dnde y cmo usarlos. Las farmacias generalmente imprimen las instrucciones del medicamento slo en las cajas y no directamente en los tubos del [place].   Si su medicamento es muy caro, por favor, pngase en contacto con Rolm Gala llamando al [phone] y presione la opcin 4 o envenos un mensaje a travs de Clinical cytogeneticist.   No podemos decirle cul ser su copago por los  medicamentos por adelantado ya que esto es diferente dependiendo de la cobertura de su seguro. Sin embargo, es posible que podamos encontrar un medicamento sustituto a Audiological scientist un formulario para que el seguro cubra el medicamento que se considera necesario.   Si se requiere una autorizacin previa para que su compaa de seguros [place] su medicamento, por favor permtanos de 1 a 2 das hbiles para completar [address]  Los precios de los medicamentos varan con frecuencia dependiendo del Environmental consultant de dnde se surte la receta y alguna farmacias pueden ofrecer precios ms baratos.  El sitio web www.goodrx.com tiene cupones para medicamentos de Health and safety inspector. Los precios aqu no tienen en cuenta lo que podra costar con la ayuda del seguro (puede ser ms barato con su seguro), pero el sitio web puede darle el precio si no utiliz Tourist information centre manager.  - Puede imprimir el cupn correspondiente y llevarlo con su receta a la farmacia.  - Tambin puede pasar por nuestra oficina durante el horario  de atencin regular y Education officer, museum una tarjeta de cupones de GoodRx.  - Si necesita que su receta se enve electrnicamente a una farmacia diferente, informe a nuestra oficina a travs de MyChart de [place] o por telfono llamando al [phone] y presione la opcin 4.

## 2022-12-28 ENCOUNTER — Encounter: Payer: Self-pay | Admitting: Dermatology

## 2023-01-01 LAB — SURGICAL PATHOLOGY

## 2023-01-15 ENCOUNTER — Telehealth: Payer: Self-pay

## 2023-01-15 NOTE — Telephone Encounter (Signed)
-----   Message from [place] sent at 01/02/2023  1:47 PM EDT ----- Diagnosis: left 4th fingernail :       NAIL MATERIAL, NEGATIVE FOR FUNGUS, SEE DESCRIPTION    Plan: none, sent mychart message

## 2023-01-16 NOTE — Telephone Encounter (Signed)
Patient advised of results.  Scheduled appt for 11/21. aw

## 2023-01-19 LAB — FUNGUS CULTURE W SMEAR

## 2023-01-25 ENCOUNTER — Encounter: Payer: Self-pay | Admitting: Dermatology

## 2023-01-25 ENCOUNTER — Ambulatory Visit: Payer: Medicare Other | Admitting: Dermatology

## 2023-01-25 DIAGNOSIS — B351 Tinea unguium: Secondary | ICD-10-CM

## 2023-01-25 DIAGNOSIS — L601 Onycholysis: Secondary | ICD-10-CM | POA: Diagnosis not present

## 2023-01-25 DIAGNOSIS — L602 Onychogryphosis: Secondary | ICD-10-CM | POA: Diagnosis not present

## 2023-01-25 MED ORDER — FLUCONAZOLE 150 MG PO TABS: 150.0000 mg | ORAL_TABLET | ORAL | 1 refills | Status: DC

## 2023-01-25 NOTE — Progress Notes (Addendum)
   Follow-Up Visit   Subjective  Kristi Maldonado is a 82 y.o. female who presents for the following: Recheck bil 4th and 5th fingernails, bx negative for fungus, culture showed yeast, Fluconazole 150mg  1 po q wk x 4 wks, pt feels nails have improved, check R great toenail, hx of trauma in past and now nail has dark color after removing polish 1-2 wks ago The patient has spots, moles and lesions to be evaluated, some may be new or changing and the patient may have concern these could be cancer.   The following portions of the chart were reviewed this encounter and updated as appropriate: medications, allergies, medical history  Review of Systems:  No other skin or systemic complaints except as noted in HPI or Assessment and Plan.  Objective  Well appearing patient in no apparent distress; mood and affect are within normal limits.   A focused examination was performed of the following areas: Hands, feet  Relevant exam findings are noted in the Assessment and Plan.           Assessment & Plan   ONYCHOLYSIS / ONYCHOMYCOSIS  Bx of L 4th fingernail negative for fungus Bil 4th and 5th fingernails, R great toenail Exam: improvement in onycholysis of affected fingernails. Onychauxis of R great toenail. Onycholysis of L great toenail  Fungal culture stain positive for yeasts. None grown in culture. Nail plate H&E negative  Treatment Plan: Yeast on stain and improvement with fluconazole suggest candidal onychomycosis causing onycholysis Restart Fluconazole 150mg  1 po q wk x 2 months Start La Roche Posay Lipikar with Urea to R great toenail, sample given Start Cerave cream to fingernails, sample given  Side effects of fluconazole (diflucan) include nausea, diarrhea, headache, dizziness, taste changes, rare risk of irritation of the liver, allergy, or decreased blood counts (which could show up as infection or tiredness).    Onycholysis  Onychomycosis  Onychauxis    Return in  about 2 weeks (around 02/08/2023).  I, Ardis Rowan, RMA, am acting as scribe for Elie Goody, MD .   Documentation: I have reviewed the above documentation for accuracy and completeness, and I agree with the above.  Elie Goody, MD

## 2023-01-25 NOTE — Patient Instructions (Addendum)
La Roche Posay Urea to right great toenail Cerave cream to fingernails  Due to recent changes in healthcare laws, you may see results of your pathology and/or laboratory studies on MyChart before the doctors have had a chance to review them. We understand that in some cases there may be results that are confusing or concerning to you. Please understand that not all results are received at the same time and often the doctors may need to interpret multiple results in order to provide you with the best plan of care or course of treatment. Therefore, we ask that you please give [place] 2 business days to thoroughly review all your results before contacting the office for clarification. Should we see a critical lab result, you will be contacted sooner.   If You Need Anything After Your Visit  If you have any questions or concerns for your doctor, please call our main line at [phone] and press option 4 to reach your doctor's medical assistant. If no one answers, please leave a voicemail as directed and we will return your call as soon as possible. Messages left after 4 pm will be answered the following business day.   You may also send [place] a message via MyChart. We typically respond to MyChart messages within 1-2 business days.  For prescription refills, please ask your pharmacy to contact our office. Our fax number is [phone].  If you have an urgent issue when the clinic is closed that cannot wait until the next business day, you can page your doctor at the number below.    Please note that while we do our best to be available for urgent issues outside of office hours, we are not available 24/7.   If you have an urgent issue and are unable to reach [place], you may choose to seek medical care at your doctor's office, retail clinic, urgent care center, or emergency room.  If you have a medical emergency, please immediately call 911 or go to the emergency department.  Pager Numbers  - Dr. Gwen Pounds:  [phone]  - Dr. Roseanne Reno: [phone]  - Dr. Katrinka Blazing: [phone]   In the event of inclement weather, please call our main line at [phone] for an update on the status of any delays or closures.  Dermatology Medication Tips: Please keep the boxes that topical medications come in in order to help keep track of the instructions about where and how to use these. Pharmacies typically print the medication instructions only on the boxes and not directly on the medication tubes.   If your medication is too expensive, please contact our office at [phone] option 4 or send [place] a message through MyChart.   We are unable to tell what your co-pay for medications will be in advance as this is different depending on your insurance coverage. However, we may be able to find a substitute medication at lower cost or fill out paperwork to get insurance to cover a needed medication.   If a prior authorization is required to get your medication covered by your insurance company, please allow [place] 1-2 business days to complete this process.  Drug prices often vary depending on where the prescription is filled and some pharmacies may offer cheaper prices.  The website www.goodrx.com contains coupons for medications through different pharmacies. The prices here do not account for what the cost may be with help from insurance (it may be cheaper with your insurance), but the website can give you the price if you did not use  any insurance.  - You can print the associated coupon and take it with your prescription to the pharmacy.  - You may also stop by our office during regular business hours and pick up a GoodRx coupon card.  - If you need your prescription sent electronically to a different pharmacy, notify our office through [facility] or by phone at [phone] option 4.     Si Usted Necesita Algo Despus de Su Visita  Tambin puede enviarnos un mensaje a travs de Clinical cytogeneticist. Por lo general  respondemos a los mensajes de MyChart en el transcurso de 1 a 2 das hbiles.  Para renovar recetas, por favor pida a su farmacia que se ponga en contacto con nuestra oficina. Annie Sable de fax es [place] [phone].  Si tiene un asunto urgente cuando la clnica est cerrada y que no puede esperar hasta el siguiente da hbil, puede llamar/localizar a su doctor(a) al nmero que aparece a continuacin.   Por favor, tenga en cuenta que aunque hacemos todo lo posible para estar disponibles para asuntos urgentes fuera del horario de [place], no estamos disponibles las 24 horas del da, los 7 [address]  [address] de la [place].   Si tiene un problema urgente y no puede comunicarse con nosotros, puede optar por buscar atencin mdica  en el consultorio de su doctor(a), en una clnica privada, en un centro de atencin urgente o en una sala de emergencias.  Si tiene Engineer, drilling, por favor llame inmediatamente al 911 o vaya a la sala de emergencias.  Nmeros de bper  - Dr. Gwen Pounds: [phone]  - Dra. Roseanne Reno: [phone]  - Dr. Katrinka Blazing: [phone]   En caso de inclemencias del tiempo, por favor llame a Lacy Duverney principal al [phone] para una actualizacin sobre el [place] de cualquier retraso o cierre.  Consejos para la medicacin en dermatologa: Por favor, guarde las cajas en las que vienen los medicamentos de uso tpico para ayudarle a seguir las instrucciones sobre dnde y cmo usarlos. Las farmacias generalmente imprimen las instrucciones del medicamento slo en las cajas y no directamente en los tubos del [place].   Si su medicamento es muy caro, por favor, pngase en contacto con Rolm Gala llamando al [phone] y presione la opcin 4 o envenos un mensaje a travs de Clinical cytogeneticist.   No podemos decirle cul ser su copago por los medicamentos por adelantado ya que esto es diferente dependiendo de la cobertura de su seguro. Sin embargo, es posible que podamos encontrar un  medicamento sustituto a Audiological scientist un formulario para que el seguro cubra el medicamento que se considera necesario.   Si se requiere una autorizacin previa para que su compaa de seguros [place] su medicamento, por favor permtanos de 1 a 2 das hbiles para completar [address]  Los precios de los medicamentos varan con frecuencia dependiendo del Environmental consultant de dnde se surte la receta y alguna farmacias pueden ofrecer precios ms baratos.  El sitio web www.goodrx.com tiene cupones para medicamentos de Health and safety inspector. Los precios aqu no tienen en cuenta lo que podra costar con la ayuda del seguro (puede ser ms barato con su seguro), pero el sitio web puede darle el precio si no utiliz Tourist information centre manager.  - Puede imprimir el cupn correspondiente y llevarlo con su receta a la farmacia.  - Tambin puede pasar por nuestra oficina durante el horario de atencin regular y Education officer, museum una tarjeta de cupones de GoodRx.  - Si necesita que su receta se enve electrnicamente  a [employer], informe a nuestra oficina a travs de MyChart de [place] o por telfono llamando al [phone] y presione la opcin 4.

## 2023-03-27 ENCOUNTER — Ambulatory Visit: Payer: Medicare Other | Admitting: Dermatology

## 2023-04-05 ENCOUNTER — Ambulatory Visit: Payer: Medicare Other | Admitting: Dermatology

## 2023-04-12 ENCOUNTER — Ambulatory Visit: Payer: Medicare Other | Admitting: Dermatology

## 2023-04-12 ENCOUNTER — Encounter: Payer: Self-pay | Admitting: Dermatology

## 2023-04-12 DIAGNOSIS — B351 Tinea unguium: Secondary | ICD-10-CM

## 2023-04-12 DIAGNOSIS — L601 Onycholysis: Secondary | ICD-10-CM | POA: Diagnosis not present

## 2023-04-12 DIAGNOSIS — L602 Onychogryphosis: Secondary | ICD-10-CM

## 2023-04-12 NOTE — Progress Notes (Signed)
   Follow-Up Visit   Subjective  Kristi Maldonado is a 83 y.o. female who presents for the following: 3 months follow up on nail fungus on the 4th and 5th fingernails, R great toenail,  patient took  Fluconazole  150mg  1 po q wk in December and January, patient using  La Roche Posay Lipikar with Urea to R great toenail.  patient report nails are doing much better    The following portions of the chart were reviewed this encounter and updated as appropriate: medications, allergies, medical history  Review of Systems:  No other skin or systemic complaints except as noted in HPI or Assessment and Plan.  Objective  Well appearing patient in no apparent distress; mood and affect are within normal limits.    A focused examination was performed of the following areas:hands, fingernails    Relevant exam findings are noted in the Assessment and Plan.         Assessment & Plan   ONYCHOLYSIS / ONYCHOMYCOSIS  Bx of L 4th fingernail negative for fungus Bil 4th and 5th fingernails, R great toenail  Exam: resolution of onycholysis of affected fingernails.  Onychauxis of R great toenail (not examined today)  Treatment:  Start OTC Urea 40 % cream to R great toenail D/C  La Roche Posay Lipikar with Urea   Patient instructed to send a Mychart message if fingernails start separating again. We would send the same fluconazole  prescription of 150 mg weekly for 4 months   ONYCHOLYSIS   ONYCHOMYCOSIS   ONYCHAUXIS    Return if symptoms worsen or fail to improve.  IFay Kirks, CMA, am acting as scribe for Boneta Sharps, MD .   Documentation: I have reviewed the above documentation for accuracy and completeness, and I agree with the above.  Boneta Sharps, MD

## 2023-04-12 NOTE — Patient Instructions (Addendum)
 [employer]    Due to recent changes in healthcare laws, you may see results of your pathology and/or laboratory studies on MyChart before the doctors have had a chance to review them. We understand that in some cases there may be results that are confusing or concerning to you. Please understand that not all results are received at the same time and often the doctors may need to interpret multiple results in order to provide you with the best plan of care or course of treatment. Therefore, we ask that you please give us  2 business days to thoroughly review all your results before contacting the office for clarification. Should we see a critical lab result, you will be contacted sooner.   If You Need Anything After Your Visit  If you have any questions or concerns for your doctor, please call our main line at [phone] and press option 4 to reach your doctor's medical assistant. If no one answers, please leave a voicemail as directed and we will return your call as soon as possible. Messages left after 4 pm will be answered the following business day.   You may also send us  a message via MyChart. We typically respond to MyChart messages within 1-2 business days.  For prescription refills, please ask your pharmacy to contact our office. Our fax number is [phone].  If you have an urgent issue when the clinic is closed that cannot wait until the next business day, you can page your doctor at the number below.    Please note that while we do our best to be available for urgent issues outside of office hours, we are not available 24/7.   If you have an urgent issue and are unable to reach us , you may choose to seek medical care at your doctor's office, retail clinic, urgent care center, or emergency room.  If you have a medical emergency, please immediately call 911 or go to the emergency department.  Pager Numbers  - Dr. Hester: [phone]  - Dr. Jackquline: [phone]  - Dr. Claudene:  [phone]   In the event of inclement weather, please call our main line at [phone] for an update on the status of any delays or closures.  Dermatology Medication Tips: Please keep the boxes that topical medications come in in order to help keep track of the instructions about where and how to use these. Pharmacies typically print the medication instructions only on the boxes and not directly on the medication tubes.   If your medication is too expensive, please contact our office at [phone] option 4 or send us  a message through MyChart.   We are unable to tell what your co-pay for medications will be in advance as this is different depending on your insurance coverage. However, we may be able to find a substitute medication at lower cost or fill out paperwork to get insurance to cover a needed medication.   If a prior authorization is required to get your medication covered by your insurance company, please allow us  1-2 business days to complete this process.  Drug prices often vary depending on where the prescription is filled and some pharmacies may offer cheaper prices.  The website www.goodrx.com contains coupons for medications through different pharmacies. The prices here do not account for what the cost may be with help from insurance (it may be cheaper with your insurance), but the website can give you the price if you did not use any insurance.  - You can print the  associated coupon and take it with your prescription to the pharmacy.  - You may also stop by our office during regular business hours and pick up a GoodRx coupon card.  - If you need your prescription sent electronically to a different pharmacy, notify our office through [facility] or by phone at [phone] option 4.     Si Usted Necesita Algo Despus de Su Visita  Tambin puede enviarnos un mensaje a travs de Clinical Cytogeneticist. Por lo general respondemos a los mensajes de MyChart en el transcurso de 1 a  2 das hbiles.  Para renovar recetas, por favor pida a su farmacia que se ponga en contacto con nuestra oficina. Randi lakes de fax es [place] [phone].  Si tiene un asunto urgente cuando la clnica est cerrada y que no puede esperar hasta el siguiente da hbil, puede llamar/localizar a su doctor(a) al nmero que aparece a continuacin.   Por favor, tenga en cuenta que aunque hacemos todo lo posible para estar disponibles para asuntos urgentes fuera del horario de [place], no estamos disponibles las 24 horas del da, los 7 [address]  [phone] de la [place].   Si tiene un problema urgente y no puede comunicarse con nosotros, puede optar por buscar atencin mdica  en el consultorio de su doctor(a), en una clnica privada, en un centro de atencin urgente o en una sala de emergencias.  Si tiene engineer, drilling, por favor llame inmediatamente al 911 o vaya a la sala de emergencias.  Nmeros de bper  - Dr. Hester: [phone]  - Dra. Jackquline: [phone]  - Dr. Claudene: [phone]   En caso de inclemencias del tiempo, por favor llame a landry capes principal al [phone] para una actualizacin sobre el [place] de cualquier retraso o cierre.  Consejos para la medicacin en dermatologa: Por favor, guarde las cajas en las que vienen los medicamentos de uso tpico para ayudarle a seguir las instrucciones sobre dnde y cmo usarlos. Las farmacias generalmente imprimen las instrucciones del medicamento slo en las cajas y no directamente en los tubos del [place].   Si su medicamento es muy caro, por favor, pngase en contacto con landry rieger llamando al [phone] y presione la opcin 4 o envenos un mensaje a travs de Clinical Cytogeneticist.   No podemos decirle cul ser su copago por los medicamentos por adelantado ya que esto es diferente dependiendo de la cobertura de su seguro. Sin embargo, es posible que podamos encontrar un medicamento sustituto a audiological scientist un formulario para que  el seguro cubra el medicamento que se considera necesario.   Si se requiere una autorizacin previa para que su compaa de seguros cubra su medicamento, por favor permtanos de 1 a 2 das hbiles para completar este proceso.  Los precios de los medicamentos varan con frecuencia dependiendo del environmental consultant de dnde se surte la receta y alguna farmacias pueden ofrecer precios ms baratos.  El sitio web www.goodrx.com tiene cupones para medicamentos de health and safety inspector. Los precios aqu no tienen en cuenta lo que podra costar con la ayuda del seguro (puede ser ms barato con su seguro), pero el sitio web puede darle el precio si no utiliz tourist information centre manager.  - Puede imprimir el cupn correspondiente y llevarlo con su receta a la farmacia.  - Tambin puede pasar por nuestra oficina durante el horario de atencin regular y education officer, museum una tarjeta de cupones de GoodRx.  - Si necesita que su receta se enve electrnicamente a psychiatrist, informe a nuestra oficina  a travs de MyChart de [place] o por telfono llamando al [phone] y presione la opcin 4.

## 2023-05-14 ENCOUNTER — Other Ambulatory Visit: Payer: Self-pay | Admitting: Physician Assistant

## 2023-05-14 ENCOUNTER — Ambulatory Visit
Admission: RE | Admit: 2023-05-14 | Discharge: 2023-05-14 | Disposition: A | Discharge status: Home / Self Care | Source: Ambulatory Visit | Attending: Physician Assistant | Admitting: Physician Assistant

## 2023-05-14 DIAGNOSIS — R42 Dizziness and giddiness: Secondary | ICD-10-CM

## 2023-05-14 DIAGNOSIS — R519 Headache, unspecified: Secondary | ICD-10-CM

## 2023-08-10 ENCOUNTER — Other Ambulatory Visit: Payer: Self-pay | Admitting: Internal Medicine

## 2023-08-10 DIAGNOSIS — Z1231 Encounter for screening mammogram for malignant neoplasm of breast: Secondary | ICD-10-CM

## 2023-08-20 ENCOUNTER — Ambulatory Visit
Admission: RE | Admit: 2023-08-20 | Discharge: 2023-08-20 | Disposition: A | Discharge status: Home / Self Care | Source: Ambulatory Visit | Attending: Internal Medicine | Admitting: Internal Medicine

## 2023-08-20 ENCOUNTER — Other Ambulatory Visit: Payer: Self-pay | Admitting: Internal Medicine

## 2023-08-20 DIAGNOSIS — R10824 Left lower quadrant rebound abdominal tenderness: Secondary | ICD-10-CM

## 2023-08-20 DIAGNOSIS — R1084 Generalized abdominal pain: Secondary | ICD-10-CM

## 2023-08-21 ENCOUNTER — Encounter: Payer: Self-pay | Admitting: Internal Medicine

## 2023-08-23 ENCOUNTER — Encounter

## 2023-09-03 ENCOUNTER — Ambulatory Visit
Admission: RE | Admit: 2023-09-03 | Discharge: 2023-09-03 | Disposition: A | Discharge status: Home / Self Care | Source: Ambulatory Visit | Attending: Internal Medicine | Admitting: Internal Medicine

## 2023-09-03 DIAGNOSIS — Z1231 Encounter for screening mammogram for malignant neoplasm of breast: Secondary | ICD-10-CM | POA: Diagnosis present

## 2023-09-18 ENCOUNTER — Other Ambulatory Visit: Payer: Self-pay | Admitting: Internal Medicine

## 2023-09-18 DIAGNOSIS — G959 Disease of spinal cord, unspecified: Secondary | ICD-10-CM

## 2023-09-21 ENCOUNTER — Ambulatory Visit

## 2023-09-24 ENCOUNTER — Ambulatory Visit
Admission: RE | Admit: 2023-09-24 | Discharge: 2023-09-24 | Disposition: A | Discharge status: Home / Self Care | Source: Ambulatory Visit | Attending: Internal Medicine | Admitting: Internal Medicine

## 2023-09-24 DIAGNOSIS — G959 Disease of spinal cord, unspecified: Secondary | ICD-10-CM | POA: Diagnosis present

## 2024-01-11 ENCOUNTER — Other Ambulatory Visit
Admission: RE | Admit: 2024-01-11 | Discharge: 2024-01-11 | Disposition: A | Discharge status: Home / Self Care | Source: Ambulatory Visit | Attending: Ophthalmology | Admitting: Ophthalmology

## 2024-01-11 DIAGNOSIS — G4453 Primary thunderclap headache: Secondary | ICD-10-CM | POA: Diagnosis not present

## 2024-01-11 DIAGNOSIS — G4452 New daily persistent headache (NDPH): Secondary | ICD-10-CM | POA: Diagnosis present

## 2024-01-11 LAB — CBC
HCT: 31.8 % — ABNORMAL LOW (ref 36.0–46.0)
Hemoglobin: 10.6 g/dL — ABNORMAL LOW (ref 12.0–15.0)
MCH: 31.7 pg (ref 26.0–34.0)
MCHC: 33.3 g/dL (ref 30.0–36.0)
MCV: 95.2 fL (ref 80.0–100.0)
Platelets: 225 K/uL (ref 150–400)
RBC: 3.34 MIL/uL — ABNORMAL LOW (ref 3.87–5.11)
RDW: 12.7 % (ref 11.5–15.5)
WBC: 8.5 K/uL (ref 4.0–10.5)
nRBC: 0 % (ref 0.0–0.2)

## 2024-01-11 LAB — SEDIMENTATION RATE: Sed Rate: 70 mm/h — ABNORMAL HIGH (ref 0–30)

## 2024-01-11 LAB — C-REACTIVE PROTEIN: CRP: 5.9 mg/dL — ABNORMAL HIGH (ref <1.0)

## 2024-01-15 ENCOUNTER — Telehealth (INDEPENDENT_AMBULATORY_CARE_PROVIDER_SITE_OTHER): Payer: Self-pay | Admitting: Vascular Surgery

## 2024-01-15 NOTE — Telephone Encounter (Signed)
 Called pt to schedule urgent appointment. Offered patient to come to [facility] same day. Patient stated she wasn't feeling well and wanted to push appointment out a week. Patient thinks she has a stomach bug or something. Advised pt that referral was marked as Urgent. Scheduled pt to come in Tuesday, 01/22/24 per pt request

## 2024-01-21 ENCOUNTER — Other Ambulatory Visit: Payer: Self-pay | Admitting: Internal Medicine

## 2024-01-21 DIAGNOSIS — R519 Headache, unspecified: Secondary | ICD-10-CM

## 2024-01-21 DIAGNOSIS — H539 Unspecified visual disturbance: Secondary | ICD-10-CM

## 2024-01-22 ENCOUNTER — Encounter (INDEPENDENT_AMBULATORY_CARE_PROVIDER_SITE_OTHER): Payer: Self-pay | Admitting: Vascular Surgery

## 2024-01-22 ENCOUNTER — Other Ambulatory Visit (INDEPENDENT_AMBULATORY_CARE_PROVIDER_SITE_OTHER): Payer: Self-pay | Admitting: Nurse Practitioner

## 2024-01-22 ENCOUNTER — Telehealth (INDEPENDENT_AMBULATORY_CARE_PROVIDER_SITE_OTHER): Payer: Self-pay

## 2024-01-22 ENCOUNTER — Ambulatory Visit (INDEPENDENT_AMBULATORY_CARE_PROVIDER_SITE_OTHER): Payer: Self-pay | Admitting: Vascular Surgery

## 2024-01-22 VITALS — BP 152/71 | HR 52 | Resp 18 | Ht 68.0 in | Wt 131.2 lb

## 2024-01-22 DIAGNOSIS — E782 Mixed hyperlipidemia: Secondary | ICD-10-CM

## 2024-01-22 DIAGNOSIS — R519 Headache, unspecified: Secondary | ICD-10-CM

## 2024-01-22 DIAGNOSIS — I6523 Occlusion and stenosis of bilateral carotid arteries: Secondary | ICD-10-CM | POA: Diagnosis not present

## 2024-01-22 DIAGNOSIS — I10 Essential (primary) hypertension: Secondary | ICD-10-CM | POA: Diagnosis not present

## 2024-01-22 DIAGNOSIS — G8929 Other chronic pain: Secondary | ICD-10-CM

## 2024-01-22 DIAGNOSIS — I6529 Occlusion and stenosis of unspecified carotid artery: Secondary | ICD-10-CM | POA: Insufficient documentation

## 2024-01-22 NOTE — Telephone Encounter (Signed)
 I attempted to contact the patient to schedule for her a right temporal biopsy with Dr. Marea. A message was left for a return call.

## 2024-01-22 NOTE — Assessment & Plan Note (Signed)
 Had a carotid duplex about a month ago and had very mild carotid disease in the 1-39% range bilaterally. Vertebral arteries were antegrade bilaterally.  Not the cause of her headaches and very mild disease at this point. Check every couple of years with duplex.

## 2024-01-22 NOTE — Progress Notes (Signed)
 Patient ID: Kristi Maldonado, female   DOB: 05/02/1940, 83 y.o.   MRN: [id number]  Chief Complaint  Patient presents with   Establish Care    New patient weakness, persistent headaches. Carotid done 12/07/23 @ [facility] ref. porfilio    HPI Kristi Maldonado is a 83 y.o. female.  I am asked to see the patient by Dr. Jaye for evaluation of headaches and visual changes and possible temporal arteritis.  She has been having a right temporal and frontal headache for the past few weeks.  These are associated with blurry vision and visual changes.  She was seen by her primary care physician as well as an ophthalmologist.  She was also found to have a fairly markedly elevated sedimentation rate and CRP. She was given 5 days of steroids which helped some.  She is relatively healthy otherwise.  She has never had symptoms similar to this.  No hearing issues or other neurologic symptoms.  Given the symptoms and her elevated inflammatory markers, consideration for temporal arteritis is given and she is referred for further evaluation and treatment.  She did have a carotid ultrasound last month in the [facility] system that showed very mild carotid artery disease bilaterally with normal subclavian and vertebral arteries bilaterally.   Past Medical History:  Diagnosis Date   Degenerative disc disease, lumbar    Difficult airway for intubation    Patient told by anesthesiology that she should tell future providers to always use a pediatric ET tube due to narrow airway and prior surgical issues including scarring   Difficult intubation    Narrow airway.  Needs pediatric tube   Hypertension    Hypothyroidism    Thyroid  disease    Vertigo    x1   Wears dentures    partial lower    Past Surgical History:  Procedure Laterality Date   CATARACT EXTRACTION W/PHACO Left 01/20/2020   Procedure: CATARACT EXTRACTION PHACO AND INTRAOCULAR LENS PLACEMENT (IOC) LEFT 6.48 00:42.2;  Surgeon: Kristi Fallow, MD;  Location: [facility];  Service: Ophthalmology;  Laterality: Left;   CATARACT EXTRACTION W/PHACO Right 03/16/2020   Procedure: CATARACT EXTRACTION PHACO AND INTRAOCULAR LENS PLACEMENT (IOC) RIGHT;  Surgeon: Kristi Fallow, MD;  Location: [facility];  Service: Ophthalmology;  Laterality: Right;  6.11 0:43.5   SALIVARY GLAND SURGERY     THYROID  SURGERY  2000   total vaginal hysterectomy, bilateral uterosacral ligament vaginal vault suspension, anterior colporrhaphy, perineorrhaphy, cystourethroscopy surgery on 08/11/2020  2022   TUBAL LIGATION       Family History  Problem Relation Age of Onset   Breast cancer Maternal Aunt        two aunts   Breast cancer Cousin        maternal  No bleeding or clotting disorders   Social History   Tobacco Use   Smoking status: Former   Smokeless tobacco: Never   Tobacco comments:    smoked some in her early 61s  Vaping Use   Vaping status: Never Used  Substance Use Topics   Alcohol use: Yes    Alcohol/week: 3.0 standard drinks of alcohol    Types: 3 Glasses of wine per week   Drug use: No    Allergies  Allergen Reactions   Lisinopril Anaphylaxis   Amlodipine  Swelling    Tongue swelling    Contrast Media [Iodinated Contrast Media] Hives and Nausea Only   Codeine Rash   Penicillins Rash and Other (See Comments)  Has patient had a PCN reaction causing immediate rash, facial/tongue/throat swelling, SOB or lightheadedness with hypotension: Unknown Has patient had a PCN reaction causing severe rash involving mucus membranes or skin necrosis: No Has patient had a PCN reaction that required hospitalization: No Has patient had a PCN reaction occurring within the last 10 years: No If all of the above answers are NO, then may proceed with Cephalosporin use.     Current Outpatient Medications  Medication Sig Dispense Refill   Ascorbic Acid (VITAMIN C PO) Take by mouth.     aspirin EC 81 MG tablet Take 81 mg by mouth daily. Swallow  whole.     b complex vitamins capsule Take 1 capsule by mouth daily.     Calcium Citrate-Vitamin D (CALCIUM + D PO) Take by mouth.     calcium-vitamin D (OSCAL WITH D) 500-200 MG-UNIT tablet Take 1 tablet by mouth.     ciclopirox (PENLAC) 8 % solution Apply topically at bedtime. Apply over nail and surrounding skin. Apply daily over previous coat. After seven (7) days, may remove with alcohol and continue cycle.     estradiol (ESTRACE) 0.1 MG/GM vaginal cream Place 1 Applicatorful vaginally at bedtime.     famotidine (PEPCID) 20 MG tablet Take 20 mg by mouth 2 (two) times daily.     fluconazole  (DIFLUCAN ) 150 MG tablet Take 1 tablet (150 mg total) by mouth once a week. 1 pill a week for 2 months 4 tablet 1   fluocinonide cream (LIDEX) 0.05 % Apply 1 application topically 2 (two) times daily.     levothyroxine  (SYNTHROID , LEVOTHROID) 75 MCG tablet Take 75 mcg by mouth daily before breakfast.     lidocaine  (LIDODERM ) 5 % Place 1 patch onto the skin daily. Remove & Discard patch within 12 hours or as directed by MD     magnesium oxide (MAG-OX) 400 MG tablet Take 400 mg by mouth daily.     mometasone  (ELOCON ) 0.1 % cream Apply 1 application. topically daily. Apply to affected area twice daily as needed for itch. 15 g 0   Multiple Vitamin (MULTIVITAMIN) tablet Take 1 tablet by mouth daily.     olmesartan-hydrochlorothiazide (BENICAR HCT) 20-12.5 MG tablet Take 1 tablet by mouth daily.     PARoxetine (PAXIL) 20 MG tablet Take 20 mg by mouth daily.     rosuvastatin (CRESTOR) 5 MG tablet Take 5 mg by mouth daily.     VITAMIN E PO Take by mouth.     zolpidem  (AMBIEN ) 5 MG tablet Take 5 mg by mouth at bedtime as needed for sleep.     No current facility-administered medications for this visit.      REVIEW OF SYSTEMS (Negative unless checked)  Constitutional: [] Weight loss  [] Fever  [] Chills Cardiac: [] Chest pain   [] Chest pressure   [] Palpitations   [] Shortness of breath when laying flat    [] Shortness of breath at rest   [] Shortness of breath with exertion. Vascular:  [] Pain in legs with walking   [] Pain in legs at rest   [] Pain in legs when laying flat   [] Claudication   [] Pain in feet when walking  [] Pain in feet at rest  [] Pain in feet when laying flat   [] History of DVT   [] Phlebitis   [] Swelling in legs   [] Varicose veins   [] Non-healing ulcers X positive for headaches Pulmonary:   [] Uses home oxygen   [] Productive cough   [] Hemoptysis   [] Wheeze  [] COPD   [] Asthma Neurologic:  []   Dizziness  [] Blackouts   [] Seizures   [] History of stroke   [] History of TIA  [] Aphasia   [] Temporary blindness   [] Dysphagia   [] Weakness or numbness in arms   [] Weakness or numbness in legs Musculoskeletal:  [] Arthritis   [] Joint swelling   [] Joint pain   [x] Low back pain Hematologic:  [] Easy bruising  [] Easy bleeding   [] Hypercoagulable state   [] Anemic  [] Hepatitis Gastrointestinal:  [] Blood in stool   [] Vomiting blood  [] Gastroesophageal reflux/heartburn   [] Abdominal pain Genitourinary:  [x] Chronic kidney disease   [] Difficult urination  [] Frequent urination  [] Burning with urination   [] Hematuria Skin:  [] Rashes   [] Ulcers   [] Wounds Psychological:  [] History of anxiety   []  History of major depression.    Physical Exam BP (!) 152/71 (BP Location: Left Arm, Patient Position: Sitting, Cuff Size: Small)   Pulse (!) 52   Resp 18   Ht 5' 8 (1.727 m)   Wt 131 lb 3.2 oz (59.5 kg)   BMI 19.95 kg/m  Gen:  WD/WN, NAD. Appears younger than stated age. Head: [place]/AT, No temporalis wasting. Prominent temporal pulses Ear/Nose/Throat: Hearing grossly intact, nares w/o erythema or drainage, oropharynx w/o Erythema/Exudate Eyes: Conjunctiva clear, sclera non-icteric  Neck: trachea midline.  No JVD.  Pulmonary:  Good air movement, respirations not labored, no use of accessory muscles  Cardiac: bradycardic Vascular:  Vessel Right Left  Radial Palpable Palpable                                Musculoskeletal: M/S 5/5 throughout.  Extremities without ischemic changes.  No deformity or atrophy. No edema. Neurologic: Sensation grossly intact in extremities.  Symmetrical.  Speech is fluent. Motor exam as listed above. Psychiatric: Judgment intact, Mood & affect appropriate for pt's clinical situation. Dermatologic: No rashes or ulcers noted.  No cellulitis or open wounds.    Radiology No results found.  Labs Recent Results (from the past 2160 hours)  CBC     Status: Abnormal   Collection Time: 01/11/24 11:33 AM  Result Value Ref Range   WBC 8.5 4.0 - 10.5 K/uL   RBC 3.34 (L) 3.87 - 5.11 MIL/uL   Hemoglobin 10.6 (L) 12.0 - 15.0 g/dL   HCT 68.1 (L) 63.9 - 53.9 %   MCV 95.2 80.0 - 100.0 fL   MCH 31.7 26.0 - 34.0 pg   MCHC 33.3 30.0 - 36.0 g/dL   RDW 87.2 88.4 - 84.4 %   Platelets 225 150 - 400 K/uL   nRBC 0.0 0.0 - 0.2 %    Comment: Performed at [facility], [address] [place], [place] [address]  C-reactive protein     Status: Abnormal   Collection Time: 01/11/24 11:33 AM  Result Value Ref Range   CRP 5.9 (H) <1.0 mg/dL    Comment: Performed at [facility], [address] [place], [place] [address]  Sedimentation rate     Status: Abnormal   Collection Time: 01/11/24 11:33 AM  Result Value Ref Range   Sed Rate 70 (H) 0 - 30 mm/hr    Comment: Performed at [facility], [address] [place], [place] [address]    Assessment/Plan:  Carotid stenosis Had a carotid duplex about a month ago and had very mild carotid disease in the 1-39% range bilaterally. Vertebral arteries were antegrade bilaterally.  Not the cause of her headaches and very mild disease at  this point. Check every couple of years with duplex.  Essential hypertension blood pressure control important in reducing the progression of atherosclerotic disease. On appropriate oral medications.   Mixed hyperlipidemia lipid control important in reducing the progression of  atherosclerotic disease. Continue statin therapy   Chronic right-sided headaches The patient has predominantly right sided headaches with elevation of her inflammatory markers that is concerning for temporal arteritis.  I had a long discussion with the patient and her friend today regarding the pathophysiology and natural history of temporal arteritis.  Really the only way to diagnose this would be with a temporal artery biopsy.  She has a strong enough clinical history that I believe this would be a prudent option and I would like to get that done ASAP.  She is in agreement and we will plan temporal artery biopsy later this week.      Selinda Gu 01/22/2024, 12:57 PM   This note was created with Dragon medical transcription system.  Any errors from dictation are unintentional.

## 2024-01-22 NOTE — Assessment & Plan Note (Signed)
 lipid control important in reducing the progression of atherosclerotic disease. Continue statin therapy

## 2024-01-22 NOTE — H&P (View-Only) (Signed)
 Patient ID: Kristi Maldonado, female   DOB: 05/02/1940, 83 y.o.   MRN: [id number]  Chief Complaint  Patient presents with   Establish Care    New patient weakness, persistent headaches. Carotid done 12/07/23 @ [facility] ref. porfilio    HPI Kristi Maldonado is a 83 y.o. female.  I am asked to see the patient by Dr. Jaye for evaluation of headaches and visual changes and possible temporal arteritis.  She has been having a right temporal and frontal headache for the past few weeks.  These are associated with blurry vision and visual changes.  She was seen by her primary care physician as well as an ophthalmologist.  She was also found to have a fairly markedly elevated sedimentation rate and CRP. She was given 5 days of steroids which helped some.  She is relatively healthy otherwise.  She has never had symptoms similar to this.  No hearing issues or other neurologic symptoms.  Given the symptoms and her elevated inflammatory markers, consideration for temporal arteritis is given and she is referred for further evaluation and treatment.  She did have a carotid ultrasound last month in the [facility] system that showed very mild carotid artery disease bilaterally with normal subclavian and vertebral arteries bilaterally.   Past Medical History:  Diagnosis Date   Degenerative disc disease, lumbar    Difficult airway for intubation    Patient told by anesthesiology that she should tell future providers to always use a pediatric ET tube due to narrow airway and prior surgical issues including scarring   Difficult intubation    Narrow airway.  Needs pediatric tube   Hypertension    Hypothyroidism    Thyroid  disease    Vertigo    x1   Wears dentures    partial lower    Past Surgical History:  Procedure Laterality Date   CATARACT EXTRACTION W/PHACO Left 01/20/2020   Procedure: CATARACT EXTRACTION PHACO AND INTRAOCULAR LENS PLACEMENT (IOC) LEFT 6.48 00:42.2;  Surgeon: Jaye Fallow, MD;  Location: [facility];  Service: Ophthalmology;  Laterality: Left;   CATARACT EXTRACTION W/PHACO Right 03/16/2020   Procedure: CATARACT EXTRACTION PHACO AND INTRAOCULAR LENS PLACEMENT (IOC) RIGHT;  Surgeon: Jaye Fallow, MD;  Location: [facility];  Service: Ophthalmology;  Laterality: Right;  6.11 0:43.5   SALIVARY GLAND SURGERY     THYROID  SURGERY  2000   total vaginal hysterectomy, bilateral uterosacral ligament vaginal vault suspension, anterior colporrhaphy, perineorrhaphy, cystourethroscopy surgery on 08/11/2020  2022   TUBAL LIGATION       Family History  Problem Relation Age of Onset   Breast cancer Maternal Aunt        two aunts   Breast cancer Cousin        maternal  No bleeding or clotting disorders   Social History   Tobacco Use   Smoking status: Former   Smokeless tobacco: Never   Tobacco comments:    smoked some in her early 61s  Vaping Use   Vaping status: Never Used  Substance Use Topics   Alcohol use: Yes    Alcohol/week: 3.0 standard drinks of alcohol    Types: 3 Glasses of wine per week   Drug use: No    Allergies  Allergen Reactions   Lisinopril Anaphylaxis   Amlodipine  Swelling    Tongue swelling    Contrast Media [Iodinated Contrast Media] Hives and Nausea Only   Codeine Rash   Penicillins Rash and Other (See Comments)  Has patient had a PCN reaction causing immediate rash, facial/tongue/throat swelling, SOB or lightheadedness with hypotension: Unknown Has patient had a PCN reaction causing severe rash involving mucus membranes or skin necrosis: No Has patient had a PCN reaction that required hospitalization: No Has patient had a PCN reaction occurring within the last 10 years: No If all of the above answers are NO, then may proceed with Cephalosporin use.     Current Outpatient Medications  Medication Sig Dispense Refill   Ascorbic Acid (VITAMIN C PO) Take by mouth.     aspirin EC 81 MG tablet Take 81 mg by mouth daily. Swallow  whole.     b complex vitamins capsule Take 1 capsule by mouth daily.     Calcium Citrate-Vitamin D (CALCIUM + D PO) Take by mouth.     calcium-vitamin D (OSCAL WITH D) 500-200 MG-UNIT tablet Take 1 tablet by mouth.     ciclopirox (PENLAC) 8 % solution Apply topically at bedtime. Apply over nail and surrounding skin. Apply daily over previous coat. After seven (7) days, may remove with alcohol and continue cycle.     estradiol (ESTRACE) 0.1 MG/GM vaginal cream Place 1 Applicatorful vaginally at bedtime.     famotidine (PEPCID) 20 MG tablet Take 20 mg by mouth 2 (two) times daily.     fluconazole  (DIFLUCAN ) 150 MG tablet Take 1 tablet (150 mg total) by mouth once a week. 1 pill a week for 2 months 4 tablet 1   fluocinonide cream (LIDEX) 0.05 % Apply 1 application topically 2 (two) times daily.     levothyroxine  (SYNTHROID , LEVOTHROID) 75 MCG tablet Take 75 mcg by mouth daily before breakfast.     lidocaine  (LIDODERM ) 5 % Place 1 patch onto the skin daily. Remove & Discard patch within 12 hours or as directed by MD     magnesium oxide (MAG-OX) 400 MG tablet Take 400 mg by mouth daily.     mometasone  (ELOCON ) 0.1 % cream Apply 1 application. topically daily. Apply to affected area twice daily as needed for itch. 15 g 0   Multiple Vitamin (MULTIVITAMIN) tablet Take 1 tablet by mouth daily.     olmesartan-hydrochlorothiazide (BENICAR HCT) 20-12.5 MG tablet Take 1 tablet by mouth daily.     PARoxetine (PAXIL) 20 MG tablet Take 20 mg by mouth daily.     rosuvastatin (CRESTOR) 5 MG tablet Take 5 mg by mouth daily.     VITAMIN E PO Take by mouth.     zolpidem  (AMBIEN ) 5 MG tablet Take 5 mg by mouth at bedtime as needed for sleep.     No current facility-administered medications for this visit.      REVIEW OF SYSTEMS (Negative unless checked)  Constitutional: [] Weight loss  [] Fever  [] Chills Cardiac: [] Chest pain   [] Chest pressure   [] Palpitations   [] Shortness of breath when laying flat    [] Shortness of breath at rest   [] Shortness of breath with exertion. Vascular:  [] Pain in legs with walking   [] Pain in legs at rest   [] Pain in legs when laying flat   [] Claudication   [] Pain in feet when walking  [] Pain in feet at rest  [] Pain in feet when laying flat   [] History of DVT   [] Phlebitis   [] Swelling in legs   [] Varicose veins   [] Non-healing ulcers X positive for headaches Pulmonary:   [] Uses home oxygen   [] Productive cough   [] Hemoptysis   [] Wheeze  [] COPD   [] Asthma Neurologic:  []   Dizziness  [] Blackouts   [] Seizures   [] History of stroke   [] History of TIA  [] Aphasia   [] Temporary blindness   [] Dysphagia   [] Weakness or numbness in arms   [] Weakness or numbness in legs Musculoskeletal:  [] Arthritis   [] Joint swelling   [] Joint pain   [x] Low back pain Hematologic:  [] Easy bruising  [] Easy bleeding   [] Hypercoagulable state   [] Anemic  [] Hepatitis Gastrointestinal:  [] Blood in stool   [] Vomiting blood  [] Gastroesophageal reflux/heartburn   [] Abdominal pain Genitourinary:  [x] Chronic kidney disease   [] Difficult urination  [] Frequent urination  [] Burning with urination   [] Hematuria Skin:  [] Rashes   [] Ulcers   [] Wounds Psychological:  [] History of anxiety   []  History of major depression.    Physical Exam BP (!) 152/71 (BP Location: Left Arm, Patient Position: Sitting, Cuff Size: Small)   Pulse (!) 52   Resp 18   Ht 5' 8 (1.727 m)   Wt 131 lb 3.2 oz (59.5 kg)   BMI 19.95 kg/m  Gen:  WD/WN, NAD. Appears younger than stated age. Head: [place]/AT, No temporalis wasting. Prominent temporal pulses Ear/Nose/Throat: Hearing grossly intact, nares w/o erythema or drainage, oropharynx w/o Erythema/Exudate Eyes: Conjunctiva clear, sclera non-icteric  Neck: trachea midline.  No JVD.  Pulmonary:  Good air movement, respirations not labored, no use of accessory muscles  Cardiac: bradycardic Vascular:  Vessel Right Left  Radial Palpable Palpable                                Musculoskeletal: M/S 5/5 throughout.  Extremities without ischemic changes.  No deformity or atrophy. No edema. Neurologic: Sensation grossly intact in extremities.  Symmetrical.  Speech is fluent. Motor exam as listed above. Psychiatric: Judgment intact, Mood & affect appropriate for pt's clinical situation. Dermatologic: No rashes or ulcers noted.  No cellulitis or open wounds.    Radiology No results found.  Labs Recent Results (from the past 2160 hours)  CBC     Status: Abnormal   Collection Time: 01/11/24 11:33 AM  Result Value Ref Range   WBC 8.5 4.0 - 10.5 K/uL   RBC 3.34 (L) 3.87 - 5.11 MIL/uL   Hemoglobin 10.6 (L) 12.0 - 15.0 g/dL   HCT 68.1 (L) 63.9 - 53.9 %   MCV 95.2 80.0 - 100.0 fL   MCH 31.7 26.0 - 34.0 pg   MCHC 33.3 30.0 - 36.0 g/dL   RDW 87.2 88.4 - 84.4 %   Platelets 225 150 - 400 K/uL   nRBC 0.0 0.0 - 0.2 %    Comment: Performed at [facility], [address] [place], [place] [address]  C-reactive protein     Status: Abnormal   Collection Time: 01/11/24 11:33 AM  Result Value Ref Range   CRP 5.9 (H) <1.0 mg/dL    Comment: Performed at [facility], [address] [place], [place] [address]  Sedimentation rate     Status: Abnormal   Collection Time: 01/11/24 11:33 AM  Result Value Ref Range   Sed Rate 70 (H) 0 - 30 mm/hr    Comment: Performed at [facility], [address] [place], [place] [address]    Assessment/Plan:  Carotid stenosis Had a carotid duplex about a month ago and had very mild carotid disease in the 1-39% range bilaterally. Vertebral arteries were antegrade bilaterally.  Not the cause of her headaches and very mild disease at  this point. Check every couple of years with duplex.  Essential hypertension blood pressure control important in reducing the progression of atherosclerotic disease. On appropriate oral medications.   Mixed hyperlipidemia lipid control important in reducing the progression of  atherosclerotic disease. Continue statin therapy   Chronic right-sided headaches The patient has predominantly right sided headaches with elevation of her inflammatory markers that is concerning for temporal arteritis.  I had a long discussion with the patient and her friend today regarding the pathophysiology and natural history of temporal arteritis.  Really the only way to diagnose this would be with a temporal artery biopsy.  She has a strong enough clinical history that I believe this would be a prudent option and I would like to get that done ASAP.  She is in agreement and we will plan temporal artery biopsy later this week.      Selinda Gu 01/22/2024, 12:57 PM   This note was created with Dragon medical transcription system.  Any errors from dictation are unintentional.

## 2024-01-22 NOTE — Assessment & Plan Note (Signed)
 blood pressure control important in reducing the progression of atherosclerotic disease. On appropriate oral medications.

## 2024-01-22 NOTE — Patient Instructions (Signed)
 Temporal Artery Biopsy  Arteries are blood vessels that carry blood from the heart to the rest of the body. Temporal arteries are found on the side of the head and between the ears and eyes. A biopsy is when a small piece of the artery is removed for testing. A temporal artery biopsy may be done to see if you have temporal arteritis. This is a condition that causes your arteries to become swollen or inflamed. Tell a health care provider about: Any allergies you have. All medicines you are taking, including vitamins, herbs, eye drops, creams, and over-the-counter medicines. Any problems you or family members have had with anesthesia. Any bleeding problems you have. Any surgeries you have had. Any medical conditions you have or have had. Whether you are pregnant or may be pregnant. What are the risks? Your health care provider will talk with you about risks. These may include: Bleeding. Hematoma. This is a collection of blood under the skin. Infection. Nerve damage in the temples. This can cause numbness. It can also make the muscles in your face weak. Scarring. On the scalp, hair may not grow around the scar. What happens before the procedure? Medicines Ask your provider about: Changing or stopping your regular medicines. These include any diabetes medicines or blood thinners you take. Taking medicines such as aspirin and ibuprofen. These medicines can thin your blood. Do not take them unless your provider tells you to. Taking over-the-counter medicines, vitamins, herbs, and supplements. Surgery safety Ask your provider: How your surgery site will be marked. What steps will be taken to help prevent infection. These steps may include: Removing hair at the surgery site. Washing skin with a soap that kills germs. Taking antibiotics. General instructions Follow instructions from your provider about what you may eat and drink. You may need blood tests to make sure that your blood clots  like it should. If you will be going home right after the procedure, plan to have a responsible adult: Take you home from the hospital or clinic. You will not be allowed to drive. Care for you for the time you are told. What happens during the procedure? Small monitors will be put on your body. They will be used to check your heart, blood pressure, and oxygen level. An IV will be inserted into one of your veins. You may be given: A sedative. This helps you relax. Anesthesia. This keeps you from feeling pain. It will make you fall asleep for surgery. A tool called a Doppler may be used to find the artery. An incision will be made over the artery. Two clamps will be placed. A small piece of the artery between the clamps will be cut and taken out. The ends of the artery will be closed with stitches (sutures). This helps prevent bleeding. The clamps will then be removed. The incision will be closed with sutures. A bandage (dressing) may be placed on the incision. The piece of the artery that was taken out will be tested in a lab. The procedure may vary among providers and hospitals. What happens after the procedure? Your blood pressure, heart rate, breathing rate, and blood oxygen level may be monitored until you leave the hospital or clinic. This information is not intended to replace advice given to you by your health care provider. Make sure you discuss any questions you have with your health care provider. Document Revised: 12/21/2021 Document Reviewed: 12/21/2021 Elsevier Patient Education  2024 [employer].

## 2024-01-22 NOTE — Assessment & Plan Note (Signed)
 The patient has predominantly right sided headaches with elevation of her inflammatory markers that is concerning for temporal arteritis.  I had a long discussion with the patient and her friend today regarding the pathophysiology and natural history of temporal arteritis.  Really the only way to diagnose this would be with a temporal artery biopsy.  She has a strong enough clinical history that I believe this would be a prudent option and I would like to get that done ASAP.  She is in agreement and we will plan temporal artery biopsy later this week.

## 2024-01-23 ENCOUNTER — Other Ambulatory Visit: Payer: Self-pay

## 2024-01-23 ENCOUNTER — Encounter
Admission: RE | Admit: 2024-01-23 | Discharge: 2024-01-23 | Disposition: A | Discharge status: Home / Self Care | Source: Ambulatory Visit | Attending: Vascular Surgery | Admitting: Vascular Surgery

## 2024-01-23 ENCOUNTER — Encounter: Payer: Self-pay | Admitting: Vascular Surgery

## 2024-01-23 DIAGNOSIS — Z01818 Encounter for other preprocedural examination: Secondary | ICD-10-CM | POA: Diagnosis present

## 2024-01-23 DIAGNOSIS — G8929 Other chronic pain: Secondary | ICD-10-CM | POA: Insufficient documentation

## 2024-01-23 DIAGNOSIS — I10 Essential (primary) hypertension: Secondary | ICD-10-CM | POA: Insufficient documentation

## 2024-01-23 DIAGNOSIS — M316 Other giant cell arteritis: Secondary | ICD-10-CM

## 2024-01-23 DIAGNOSIS — R519 Headache, unspecified: Secondary | ICD-10-CM | POA: Diagnosis not present

## 2024-01-23 DIAGNOSIS — Z01810 Encounter for preprocedural cardiovascular examination: Secondary | ICD-10-CM

## 2024-01-23 HISTORY — DX: Other complications of anesthesia, initial encounter: T88.59XA

## 2024-01-23 HISTORY — DX: Other giant cell arteritis: M31.6

## 2024-01-23 HISTORY — DX: Atherosclerotic heart disease of native coronary artery without angina pectoris: I25.10

## 2024-01-23 HISTORY — DX: Peripheral vascular disease, unspecified: I73.9

## 2024-01-23 HISTORY — DX: Depression, unspecified: F32.A

## 2024-01-23 HISTORY — DX: Anemia, unspecified: D64.9

## 2024-01-23 HISTORY — DX: Headache, unspecified: R51.9

## 2024-01-23 HISTORY — DX: Unspecified osteoarthritis, unspecified site: M19.90

## 2024-01-23 HISTORY — DX: Cardiac murmur, unspecified: R01.1

## 2024-01-23 HISTORY — DX: Anxiety disorder, unspecified: F41.9

## 2024-01-23 HISTORY — DX: Pneumonia, unspecified organism: J18.9

## 2024-01-23 HISTORY — DX: Personal history of other diseases of the digestive system: Z87.19

## 2024-01-23 LAB — CBC WITH DIFFERENTIAL/PLATELET
Abs Immature Granulocytes: 0.48 K/uL — ABNORMAL HIGH (ref 0.00–0.07)
Basophils Absolute: 0.1 K/uL (ref 0.0–0.1)
Basophils Relative: 1 %
Eosinophils Absolute: 0.3 K/uL (ref 0.0–0.5)
Eosinophils Relative: 3 %
HCT: 33 % — ABNORMAL LOW (ref 36.0–46.0)
Hemoglobin: 10.8 g/dL — ABNORMAL LOW (ref 12.0–15.0)
Immature Granulocytes: 5 %
Lymphocytes Relative: 10 %
Lymphs Abs: 0.9 K/uL (ref 0.7–4.0)
MCH: 31.2 pg (ref 26.0–34.0)
MCHC: 32.7 g/dL (ref 30.0–36.0)
MCV: 95.4 fL (ref 80.0–100.0)
Monocytes Absolute: 1.2 K/uL — ABNORMAL HIGH (ref 0.1–1.0)
Monocytes Relative: 13 %
Neutro Abs: 6.6 K/uL (ref 1.7–7.7)
Neutrophils Relative %: 68 %
Platelets: 302 K/uL (ref 150–400)
RBC: 3.46 MIL/uL — ABNORMAL LOW (ref 3.87–5.11)
RDW: 13.1 % (ref 11.5–15.5)
WBC: 9.6 K/uL (ref 4.0–10.5)
nRBC: 0 % (ref 0.0–0.2)

## 2024-01-23 LAB — BASIC METABOLIC PANEL WITH GFR
Anion gap: 10 (ref 5–15)
BUN: 22 mg/dL (ref 8–23)
CO2: 25 mmol/L (ref 22–32)
Calcium: 9 mg/dL (ref 8.9–10.3)
Chloride: 104 mmol/L (ref 98–111)
Creatinine, Ser: 1.21 mg/dL — ABNORMAL HIGH (ref 0.44–1.00)
GFR, Estimated: 44 mL/min — ABNORMAL LOW (ref >60)
Glucose, Bld: 105 mg/dL — ABNORMAL HIGH (ref 70–99)
Potassium: 4 mmol/L (ref 3.5–5.1)
Sodium: 139 mmol/L (ref 135–145)

## 2024-01-23 LAB — TYPE AND SCREEN
ABO/RH(D): O POS
Antibody Screen: NEGATIVE

## 2024-01-23 MED ORDER — CHLORHEXIDINE GLUCONATE CLOTH 2 % EX PADS: 6.0000 | MEDICATED_PAD | Freq: Once | CUTANEOUS | Status: DC

## 2024-01-23 MED ORDER — LACTATED RINGERS IV SOLN: INTRAVENOUS | Status: DC

## 2024-01-23 MED ORDER — ORAL CARE MOUTH RINSE: 15.0000 mL | Freq: Once | OROMUCOSAL | Status: AC

## 2024-01-23 MED ORDER — CEFAZOLIN SODIUM-DEXTROSE 2-4 GM/100ML-% IV SOLN
2.0000 g | INTRAVENOUS | Status: AC
  Administered 2024-01-24: 2 g via INTRAVENOUS

## 2024-01-23 MED ORDER — CHLORHEXIDINE GLUCONATE 0.12 % MT SOLN
15.0000 mL | Freq: Once | OROMUCOSAL | Status: AC
  Administered 2024-01-24: 15 mL via OROMUCOSAL

## 2024-01-23 NOTE — Patient Instructions (Addendum)
 Your procedure is scheduled on: 01/24/24 Report to the Registration Desk on the 1st floor of the [facility]. To find out your arrival time, please call [phone] between 1PM - 3PM on: 01/23/24 If your arrival time is 6:00 am, do not arrive before that time as the [facility] entrance doors do not open until 6:00 am.  REMEMBER: Instructions that are not followed completely may result in serious medical risk, up to and including death; or upon the discretion of your surgeon and anesthesiologist your surgery may need to be rescheduled.  Do not eat food or drink any liquids  after midnight the night before surgery.  No gum chewing or hard candies.  Stop Anti-inflammatories (NSAIDS) such as Advil, Aleve, Ibuprofen, Motrin, Naproxen, Naprosyn and Aspirin based products such as Excedrin, Goody's Powder, BC Powder.You may take Tylenol  if needed for pain up until the day of surgery.   Stop ANY OVER THE COUNTER supplements until after surgery.  You may take Tylenol  if needed for pain up until the day of surgery.  ON THE DAY OF SURGERY ONLY TAKE THESE MEDICATIONS WITH SIPS OF WATER:  levothyroxine  (SYNTHROID  )   No Alcohol for 24 hours before or after surgery.  No Smoking including e-cigarettes for 24 hours before surgery.  No chewable tobacco products for at least 6 hours before surgery.  No nicotine patches on the day of surgery.  Do not use any recreational drugs for at least a week (preferably 2 weeks) before your surgery.  Please be advised that the combination of cocaine and anesthesia may have negative outcomes, up to and including death. If you test positive for cocaine, your surgery will be cancelled.  On the morning of surgery brush your teeth with toothpaste and water, you may rinse your mouth with mouthwash if you wish. Do not swallow any toothpaste or mouthwash.  Use CHG Soap or wipes as directed on instruction sheet.  Do not wear jewelry, make-up, hairpins, clips or  nail polish.  For welded (permanent) jewelry: bracelets, anklets, waist bands, etc.  Please have this removed prior to surgery.  If it is not removed, there is a chance that hospital personnel will need to cut it off on the day of surgery.  Do not wear lotions, powders, or perfumes.   Do not shave body hair from the neck down 48 hours before surgery.  Contact lenses, hearing aids and dentures may not be worn into surgery.  Do not bring valuables to the hospital. [facility] is not responsible for any missing/lost belongings or valuables.   Notify your doctor if there is any change in your medical condition (cold, fever, infection).  Wear comfortable clothing (specific to your surgery type) to the hospital.  After surgery, you can help prevent lung complications by doing breathing exercises.  Take deep breaths and cough every 1-2 hours. Your doctor may order a device called an Incentive Spirometer to help you take deep breaths.  If you are being admitted to the hospital overnight, leave your suitcase in the car. After surgery it may be brought to your room.  In case of increased patient census, it may be necessary for you, the patient, to continue your postoperative care in the Same Day Surgery department.  If you are being discharged the day of surgery, you will not be allowed to drive home. You will need a responsible individual to drive you home and stay with you for 24 hours after surgery.   If you are taking  public transportation, you will need to have a responsible individual with you.  Please call the Pre-admissions Testing Dept. at [phone] if you have any questions about these instructions.  Surgery Visitation Policy:  Patients having surgery or a procedure may have two visitors.  Children under the age of 28 must have an adult with them who is not the patient.  Inpatient Visitation:    Visiting hours are 7 a.m. to 8 p.m. Up to four visitors are allowed at one time  in a patient room. The visitors may rotate out with other people during the day.  One visitor age 64 or older may stay with the patient overnight and must be in the room by 8 p.m.   Merchandiser, Retail to address health-related social needs:  [email or web address]

## 2024-01-24 ENCOUNTER — Ambulatory Visit
Admission: RE | Admit: 2024-01-24 | Discharge: 2024-01-24 | Disposition: A | Discharge status: Home / Self Care | Attending: Vascular Surgery | Admitting: Vascular Surgery

## 2024-01-24 ENCOUNTER — Encounter: Admission: RE | Disposition: A | Payer: Self-pay | Source: Home / Self Care | Attending: Vascular Surgery

## 2024-01-24 ENCOUNTER — Ambulatory Visit: Payer: Self-pay | Admitting: Urgent Care

## 2024-01-24 ENCOUNTER — Encounter: Payer: Self-pay | Admitting: Vascular Surgery

## 2024-01-24 ENCOUNTER — Other Ambulatory Visit: Payer: Self-pay

## 2024-01-24 DIAGNOSIS — Z79899 Other long term (current) drug therapy: Secondary | ICD-10-CM | POA: Diagnosis not present

## 2024-01-24 DIAGNOSIS — R7989 Other specified abnormal findings of blood chemistry: Secondary | ICD-10-CM | POA: Diagnosis not present

## 2024-01-24 DIAGNOSIS — I129 Hypertensive chronic kidney disease with stage 1 through stage 4 chronic kidney disease, or unspecified chronic kidney disease: Secondary | ICD-10-CM | POA: Diagnosis not present

## 2024-01-24 DIAGNOSIS — I6523 Occlusion and stenosis of bilateral carotid arteries: Secondary | ICD-10-CM | POA: Insufficient documentation

## 2024-01-24 DIAGNOSIS — R519 Headache, unspecified: Secondary | ICD-10-CM | POA: Insufficient documentation

## 2024-01-24 DIAGNOSIS — K219 Gastro-esophageal reflux disease without esophagitis: Secondary | ICD-10-CM | POA: Insufficient documentation

## 2024-01-24 DIAGNOSIS — N189 Chronic kidney disease, unspecified: Secondary | ICD-10-CM | POA: Insufficient documentation

## 2024-01-24 DIAGNOSIS — H539 Unspecified visual disturbance: Secondary | ICD-10-CM

## 2024-01-24 DIAGNOSIS — E039 Hypothyroidism, unspecified: Secondary | ICD-10-CM | POA: Diagnosis not present

## 2024-01-24 DIAGNOSIS — Z87891 Personal history of nicotine dependence: Secondary | ICD-10-CM | POA: Diagnosis not present

## 2024-01-24 DIAGNOSIS — R700 Elevated erythrocyte sedimentation rate: Secondary | ICD-10-CM | POA: Diagnosis not present

## 2024-01-24 DIAGNOSIS — K449 Diaphragmatic hernia without obstruction or gangrene: Secondary | ICD-10-CM | POA: Diagnosis not present

## 2024-01-24 DIAGNOSIS — I7789 Other specified disorders of arteries and arterioles: Secondary | ICD-10-CM | POA: Diagnosis not present

## 2024-01-24 DIAGNOSIS — E782 Mixed hyperlipidemia: Secondary | ICD-10-CM | POA: Insufficient documentation

## 2024-01-24 DIAGNOSIS — F419 Anxiety disorder, unspecified: Secondary | ICD-10-CM | POA: Insufficient documentation

## 2024-01-24 HISTORY — DX: Chronic kidney disease, stage 3 unspecified: N18.30

## 2024-01-24 HISTORY — PX: ARTERY BIOPSY: SHX891

## 2024-01-24 HISTORY — DX: Other visual disturbances: H53.8

## 2024-01-24 LAB — ABO/RH: ABO/RH(D): O POS

## 2024-01-24 SURGERY — BIOPSY TEMPORAL ARTERY
Anesthesia: General | Laterality: Right

## 2024-01-24 MED ORDER — PROPOFOL 1000 MG/100ML IV EMUL
INTRAVENOUS | Status: AC
  Filled 2024-01-24: qty 100

## 2024-01-24 MED ORDER — ONDANSETRON HCL 4 MG/2ML IJ SOLN
INTRAMUSCULAR | Status: DC | PRN
Start: 2024-01-24 — End: 2024-01-24
  Administered 2024-01-24 (×2): 4 mg via INTRAVENOUS

## 2024-01-24 MED ORDER — PHENYLEPHRINE 80 MCG/ML (10ML) SYRINGE FOR IV PUSH (FOR BLOOD PRESSURE SUPPORT)
PREFILLED_SYRINGE | INTRAVENOUS | Status: DC | PRN
Start: 2024-01-24 — End: 2024-01-24
  Administered 2024-01-24: 80 ug via INTRAVENOUS

## 2024-01-24 MED ORDER — EPHEDRINE SULFATE-NACL 50-0.9 MG/10ML-% IV SOSY
PREFILLED_SYRINGE | INTRAVENOUS | Status: DC | PRN
Start: 2024-01-24 — End: 2024-01-24
  Administered 2024-01-24: 10 mg via INTRAVENOUS

## 2024-01-24 MED ORDER — DEXAMETHASONE SOD PHOSPHATE PF 10 MG/ML IJ SOLN
INTRAMUSCULAR | Status: DC | PRN
  Administered 2024-01-24: 10 mg via INTRAVENOUS

## 2024-01-24 MED ORDER — LIDOCAINE HCL (PF) 1 % IJ SOLN
INTRAMUSCULAR | Status: AC
  Filled 2024-01-24: qty 30

## 2024-01-24 MED ORDER — PROPOFOL 10 MG/ML IV BOLUS
INTRAVENOUS | Status: DC | PRN
  Administered 2024-01-24: 30 mg via INTRAVENOUS

## 2024-01-24 MED ORDER — FENTANYL CITRATE (PF) 100 MCG/2ML IJ SOLN
INTRAMUSCULAR | Status: AC
  Filled 2024-01-24: qty 2

## 2024-01-24 MED ORDER — CEFAZOLIN SODIUM-DEXTROSE 2-4 GM/100ML-% IV SOLN
INTRAVENOUS | Status: AC
  Filled 2024-01-24: qty 100

## 2024-01-24 MED ORDER — PROPOFOL 10 MG/ML IV BOLUS
INTRAVENOUS | Status: AC
  Filled 2024-01-24: qty 20

## 2024-01-24 MED ORDER — GLYCOPYRROLATE 0.2 MG/ML IJ SOLN
INTRAMUSCULAR | Status: DC | PRN
  Administered 2024-01-24: .2 mg via INTRAVENOUS

## 2024-01-24 MED ORDER — PROPOFOL 500 MG/50ML IV EMUL
INTRAVENOUS | Status: DC | PRN
Start: 2024-01-24 — End: 2024-01-24
  Administered 2024-01-24: 145 ug/kg/min via INTRAVENOUS

## 2024-01-24 MED ORDER — KETAMINE HCL 50 MG/5ML IJ SOSY
PREFILLED_SYRINGE | INTRAMUSCULAR | Status: AC
  Filled 2024-01-24: qty 5

## 2024-01-24 MED ORDER — MIDAZOLAM HCL (PF) 2 MG/2ML IJ SOLN
INTRAMUSCULAR | Status: DC | PRN
  Administered 2024-01-24: 2 mg via INTRAVENOUS

## 2024-01-24 MED ORDER — LIDOCAINE HCL (PF) 1 % IJ SOLN
INTRAMUSCULAR | Status: DC | PRN
  Administered 2024-01-24: 7 mL

## 2024-01-24 MED ORDER — KETAMINE HCL 10 MG/ML IJ SOLN
INTRAMUSCULAR | Status: DC | PRN
  Administered 2024-01-24 (×2): 10 mg via INTRAVENOUS

## 2024-01-24 MED ORDER — ACETAMINOPHEN 10 MG/ML IV SOLN
INTRAVENOUS | Status: DC | PRN
Start: 2024-01-24 — End: 2024-01-24
  Administered 2024-01-24: 1000 mg via INTRAVENOUS

## 2024-01-24 MED ORDER — 0.9 % SODIUM CHLORIDE (POUR BTL) OPTIME
TOPICAL | Status: DC | PRN
  Administered 2024-01-24: 500 mL

## 2024-01-24 MED ORDER — CHLORHEXIDINE GLUCONATE 0.12 % MT SOLN
OROMUCOSAL | Status: AC
  Filled 2024-01-24: qty 15

## 2024-01-24 MED ORDER — ACETAMINOPHEN 10 MG/ML IV SOLN
INTRAVENOUS | Status: AC
  Filled 2024-01-24: qty 100

## 2024-01-24 MED ORDER — ONDANSETRON HCL 4 MG/2ML IJ SOLN: 4.0000 mg | Freq: Four times a day (QID) | INTRAMUSCULAR | Status: DC | PRN

## 2024-01-24 MED ORDER — MIDAZOLAM HCL 2 MG/2ML IJ SOLN
INTRAMUSCULAR | Status: AC
  Filled 2024-01-24: qty 2

## 2024-01-24 MED ORDER — HYDROMORPHONE HCL 1 MG/ML IJ SOLN: 1.0000 mg | Freq: Once | INTRAMUSCULAR | Status: DC | PRN

## 2024-01-24 SURGICAL SUPPLY — 30 items
BLADE SURG 15 STRL LF DISP TIS (BLADE) ×1 IMPLANT
BLADE SURG SZ11 CARB STEEL (BLADE) ×1 IMPLANT
CLEANSER WND VASHE 4 (WOUND CARE) IMPLANT
COTTON BALL STERILE 4 PK (GAUZE/BANDAGES/DRESSINGS) ×1 IMPLANT
DERMABOND ADVANCED .7 DNX12 (GAUZE/BANDAGES/DRESSINGS) ×1 IMPLANT
DRAPE LAPAROTOMY 77X122 PED (DRAPES) ×1 IMPLANT
DRSG TELFA 3X4 N-ADH STERILE (GAUZE/BANDAGES/DRESSINGS) ×1 IMPLANT
ELECT CAUTERY BLADE 6.4 (BLADE) ×1 IMPLANT
ELECTRODE REM PT RTRN 9FT ADLT (ELECTROSURGICAL) ×1 IMPLANT
GAUZE 4X4 16PLY ~~LOC~~+RFID DBL (SPONGE) IMPLANT
GLOVE BIO SURGEON STRL SZ7 (GLOVE) ×2 IMPLANT
GOWN STRL REUS W/ TWL LRG LVL3 (GOWN DISPOSABLE) ×2 IMPLANT
GOWN STRL REUS W/TWL 2XL LVL3 (GOWN DISPOSABLE) ×1 IMPLANT
KIT TURNOVER KIT A (KITS) ×1 IMPLANT
LABEL OR SOLS (LABEL) ×1 IMPLANT
MANIFOLD NEPTUNE II (INSTRUMENTS) ×1 IMPLANT
NDL HYPO 25X1 1.5 SAFETY (NEEDLE) IMPLANT
NEEDLE HYPO 25X1 1.5 SAFETY (NEEDLE) IMPLANT
NS IRRIG 500ML POUR BTL (IV SOLUTION) ×1 IMPLANT
PACK BASIN MINOR ARMC (MISCELLANEOUS) ×1 IMPLANT
SOLN STERILE WATER 500 ML (IV SOLUTION) ×1 IMPLANT
SOLUTION PREP PVP 2OZ (MISCELLANEOUS) ×1 IMPLANT
SUCTION TUBE FRAZIER 10FR DISP (SUCTIONS) ×1 IMPLANT
SUT MNCRL AB 4-0 PS2 18 (SUTURE) ×1 IMPLANT
SUT SILK 2-0 18XBRD TIE 12 (SUTURE) ×1 IMPLANT
SUT SILK 3-0 18XBRD TIE 12 (SUTURE) ×1 IMPLANT
SUT SILK 4-0 18XBRD TIE 12 (SUTURE) ×1 IMPLANT
SUT VIC AB 3-0 SH 27X BRD (SUTURE) ×1 IMPLANT
SYR 10ML LL (SYRINGE) IMPLANT
TRAP FLUID SMOKE EVACUATOR (MISCELLANEOUS) ×1 IMPLANT

## 2024-01-24 NOTE — Anesthesia Preprocedure Evaluation (Addendum)
 Anesthesia Evaluation  Patient identified by MRN, date of birth, ID band Patient awake    Reviewed: Allergy & Precautions, NPO status , Patient's Chart, lab work & pertinent test results  History of Anesthesia Complications (+) DIFFICULT AIRWAY and history of anesthetic complications  Airway Mallampati: II  TM Distance: >3 FB Neck ROM: full    Dental  (+) Chipped   Pulmonary former smoker   Pulmonary exam normal        Cardiovascular hypertension, Pt. on medications Normal cardiovascular exam Rhythm:Regular Rate:Normal     Neuro/Psych  Headaches  Anxiety     negative neurological ROS  negative psych ROS   GI/Hepatic negative GI ROS, Neg liver ROS, hiatal hernia,GERD  ,,  Endo/Other  negative endocrine ROSHypothyroidism    Renal/GU CRFRenal disease  negative genitourinary   Musculoskeletal   Abdominal   Peds negative pediatric ROS (+)  Hematology negative hematology ROS (+)   Anesthesia Other Findings Past Medical History: No date: Anemia No date: Anxiety No date: Arthritis No date: Blurred vision No date: CKD (chronic kidney disease), stage III (HCC) No date: Complication of anesthesia     Comment:  slow to wake up after hysterectomy had to stay over               night No date: Coronary artery disease No date: Degenerative disc disease, lumbar No date: Depression No date: Difficult airway for intubation     Comment:  Patient told by anesthesiology that she should tell               future providers to always use a pediatric ET tube due to              narrow airway and prior surgical issues including               scarring No date: Difficult intubation     Comment:  Narrow airway.  Needs pediatric tube No date: Headache No date: Heart murmur No date: History of hiatal hernia No date: Hypertension No date: Hypothyroidism No date: PAD (peripheral artery disease) No date: Pneumonia No date: Temporal  arteritis (HCC) No date: Vertigo     Comment:  x1 No date: Wears dentures     Comment:  partial lower  Past Surgical History: 01/20/2020: CATARACT EXTRACTION W/PHACO; Left     Comment:  Procedure: CATARACT EXTRACTION PHACO AND INTRAOCULAR               LENS PLACEMENT (IOC) LEFT 6.48 00:42.2;  Surgeon:               Jaye Fallow, MD;  Location: [facility];                Service: Ophthalmology;  Laterality: Left; 03/16/2020: CATARACT EXTRACTION W/PHACO; Right     Comment:  Procedure: CATARACT EXTRACTION PHACO AND INTRAOCULAR               LENS PLACEMENT (IOC) RIGHT;  Surgeon: Jaye Fallow,               MD;  Location: [facility];  Service:               Ophthalmology;  Laterality: Right;  6.11 0:43.5 No date: SALIVARY GLAND SURGERY 2000: THYROID  SURGERY 2022: total vaginal hysterectomy, bilateral uterosacral ligament  vaginal vault suspension, anterior colporrhaphy, perineorrhaphy,  cystourethroscopy surgery on 08/11/2020 No date: TUBAL LIGATION  BMI    Body Mass Index: 19.95 kg/m  Reproductive/Obstetrics negative OB ROS                              Anesthesia Physical Anesthesia Plan  ASA: 2  Anesthesia Plan: General   Post-op Pain Management:    Induction: Intravenous  PONV Risk Score and Plan: Propofol  infusion and TIVA  Airway Management Planned: Natural Airway and Nasal Cannula  Additional Equipment:   Intra-op Plan:   Post-operative Plan:   Informed Consent: I have reviewed the patients History and Physical, chart, labs and discussed the procedure including the risks, benefits and alternatives for the proposed anesthesia with the patient or authorized representative who has indicated his/her understanding and acceptance.     Dental Advisory Given  Plan Discussed with: CRNA  Anesthesia Plan Comments:          Anesthesia Quick Evaluation

## 2024-01-24 NOTE — Op Note (Signed)
        OPERATIVE NOTE   PRE-OPERATIVE DIAGNOSIS: suspected temporal arteritis, headaches, elevated ESR and CRP  POST-OPERATIVE DIAGNOSIS: Same as above  PROCEDURE: 1.   Right temporal artery biopsy  SURGEON: Selinda Gu, MD  ASSISTANT(S): none  ANESTHESIA: MAC  ESTIMATED BLOOD LOSS: Minimal  FINDING(S): 1.  none  SPECIMEN(S):  Right  superficial temporal artery sent to pathology  INDICATIONS:   Patient is a 83 y.o. female who presents with elevated inflammatory markers, headaches and visual changes concerning for temporal arteritis. We were consulted by ophthalmology for consideration for temporal artery biopsy. Risks and benefits were discussed and he was agreeable to proceed.  DESCRIPTION: After obtaining full informed written consent, the patient was brought back to the operating room and placed supine upon the operating table.  The patient received IV antibiotics prior to induction.  After obtaining adequate anesthesia, the patient was prepped and draped in the standard fashion. The area in front of his right ear was anesthetized copiously with a solution of 1% lidocaine  and half percent Marcaine without epinephrine . I then made an incision just in front of the right ear overlying the palpable pulse. I then dissected down through the subcutaneous tissues and identified the superficial temporal artery. This was dissected out over a several centimeters and branches were ligated and divided between silk ties. Care was used to avoid electrocautery around the artery. I then clamped the artery proximally and distally and transected the artery. The specimen was then sent to pathology. The proximal and distal artery were ligated with 3-0 silk ties. Hemostasis was achieved. The wound was then closed with a series of interrupted 3-0 Vicryl's and the skin was closed with a 4-0 Monocryl. Sterile dressing was placed. The patient was taken to the recovery room in stable condition having tolerated the  procedure well.  COMPLICATIONS: None  CONDITION: Stable   Selinda Gu 01/24/2024 8:22 AM  This note was created with Dragon Medical transcription system. Any errors in dictation are purely unintentional.

## 2024-01-24 NOTE — Transfer of Care (Signed)
 Immediate Anesthesia Transfer of Care Note  Patient: Kristi Maldonado  Procedure(s) Performed: BIOPSY TEMPORAL ARTERY (Right)  Patient Location: PACU  Anesthesia Type:General  Level of Consciousness: awake, drowsy, and patient cooperative  Airway & Oxygen Therapy: Patient Spontanous Breathing and Patient connected to face mask oxygen  Post-op Assessment: Report given to RN and Post -op Vital signs reviewed and stable  Post vital signs: Reviewed and stable  Last Vitals:  Vitals Value Taken Time  BP 120/60 01/24/24 08:17  Temp    Pulse 62 01/24/24 08:19  Resp 16 01/24/24 08:19  SpO2 100 % 01/24/24 08:19  Vitals shown include unfiled device data.  Last Pain:  Vitals:   01/24/24 0614  TempSrc: Temporal  PainSc: 2          Complications: No notable events documented.

## 2024-01-24 NOTE — Anesthesia Postprocedure Evaluation (Signed)
 Anesthesia Post Note  Patient: Kristi Maldonado  Procedure(s) Performed: BIOPSY TEMPORAL ARTERY (Right)  Patient location during evaluation: PACU Anesthesia Type: General Pain management: satisfactory to patient Vital Signs Assessment: post-procedure vital signs reviewed and stable Respiratory status: spontaneous breathing Cardiovascular status: stable Anesthetic complications: no   No notable events documented.   Last Vitals:  Vitals:   01/24/24 0614 01/24/24 0817  BP: 129/67 120/60  Pulse: 74 93  Resp: 18 16  Temp: (!) 36.3 C (!) 36.2 C  SpO2: 100% 100%    Last Pain:  Vitals:   01/24/24 0817  TempSrc:   PainSc: 0-No pain                 VAN STAVEREN,Arpi Diebold

## 2024-01-24 NOTE — Interval H&P Note (Signed)
 History and Physical Interval Note:  01/24/2024 7:14 AM  Kristi Maldonado  has presented today for surgery, with the diagnosis of TEMPORAL ARTERITIS.  The various methods of treatment have been discussed with the patient and family. After consideration of risks, benefits and other options for treatment, the patient has consented to  Procedure(s): BIOPSY TEMPORAL ARTERY (Right) as a surgical intervention.  The patient's history has been reviewed, patient examined, no change in status, stable for surgery.  I have reviewed the patient's chart and labs.  Questions were answered to the patient's satisfaction.     Jahquez Steffler

## 2024-01-24 NOTE — Anesthesia Procedure Notes (Addendum)
 Procedure Name: General with mask airway Date/Time: 01/24/2024 7:34 AM  Performed by: Ledora Duncan, CRNAPre-anesthesia Checklist: Patient identified, Emergency Drugs available, Suction available and Patient being monitored Patient Re-evaluated:Patient Re-evaluated prior to induction Oxygen Delivery Method: Simple face mask Induction Type: IV induction Airway Equipment and Method: Oral airway Placement Confirmation: positive ETCO2 and breath sounds checked- equal and bilateral Dental Injury: Teeth and Oropharynx as per pre-operative assessment

## 2024-01-25 ENCOUNTER — Encounter: Payer: Self-pay | Admitting: Vascular Surgery

## 2024-01-25 ENCOUNTER — Telehealth (INDEPENDENT_AMBULATORY_CARE_PROVIDER_SITE_OTHER): Payer: Self-pay

## 2024-01-25 LAB — SURGICAL PATHOLOGY

## 2024-01-25 NOTE — Telephone Encounter (Signed)
 Patient had a right temporal artery biopsy on 01/24/24 and is calling today complaining of a slight headache on the right side of her temple. Patient has no other symptoms and has not taken anything for the headache. Please advise.

## 2024-01-25 NOTE — Telephone Encounter (Signed)
 Spoke with Dr. Marea and per him the patient can take Tylenol  or Ibuprofen to help alleviate the pain in her right temple. Patient had the biopsy due to having a lot of pain in her right temple and that has been alleviated somewhat. Pathology report will most likely go to the referring provider Dr. Oneil Pinal. Patient has been informed.

## 2024-01-28 ENCOUNTER — Ambulatory Visit
Admission: RE | Admit: 2024-01-28 | Discharge: 2024-01-28 | Disposition: A | Discharge status: Home / Self Care | Source: Ambulatory Visit | Attending: Internal Medicine | Admitting: Internal Medicine

## 2024-01-28 ENCOUNTER — Encounter: Payer: Self-pay | Admitting: Radiology

## 2024-01-28 DIAGNOSIS — R519 Headache, unspecified: Secondary | ICD-10-CM | POA: Diagnosis present

## 2024-01-28 DIAGNOSIS — H539 Unspecified visual disturbance: Secondary | ICD-10-CM | POA: Insufficient documentation

## 2024-02-07 ENCOUNTER — Ambulatory Visit (INDEPENDENT_AMBULATORY_CARE_PROVIDER_SITE_OTHER): Admitting: Nurse Practitioner

## 2024-02-07 ENCOUNTER — Encounter (INDEPENDENT_AMBULATORY_CARE_PROVIDER_SITE_OTHER): Payer: Self-pay | Admitting: Nurse Practitioner

## 2024-02-07 VITALS — BP 159/76 | HR 75 | Resp 18 | Ht 68.0 in | Wt 133.2 lb

## 2024-02-07 DIAGNOSIS — M316 Other giant cell arteritis: Secondary | ICD-10-CM

## 2024-02-07 DIAGNOSIS — Z4889 Encounter for other specified surgical aftercare: Secondary | ICD-10-CM

## 2024-02-10 ENCOUNTER — Encounter (INDEPENDENT_AMBULATORY_CARE_PROVIDER_SITE_OTHER): Payer: Self-pay | Admitting: Nurse Practitioner

## 2024-02-10 NOTE — Progress Notes (Signed)
 Subjective:    Patient ID: Kristi Maldonado, female    DOB: 1940-10-17, 83 y.o.   MRN: [id number] Chief Complaint  Patient presents with   Follow-up    2 week follow up wound check no studies    HPI  Discussed the use of AI scribe software for clinical note transcription with the patient, who gave verbal consent to proceed.  History of Present Illness Kristi Maldonado is an 83 year old female who presents for follow-up after a recent temporal artery biopsy and concerns about temporal arteritis.  She experienced hoarseness following her recent procedure, which resolved quickly. Over the past two weeks, she has been feeling well with no severe headaches, only taking Tylenol  once or twice for mild discomfort.  She has a history of headaches that worsened after a five-day course of prednisone, which was prescribed to address potential inflammation. The headaches improved following a biopsy, and she has not experienced significant headaches since then, only occasional mild ones.  She is concerned about the possibility of temporal arteritis recurring and is seeking guidance on preventive treatment options. She is apprehensive about long-term steroid use.  She is dealing with residual surgical glue at the incision site. She has been cautious with hair care to avoid disturbing the incision area, using a cloth headband and cotton squares for protection. She plans to have her hairdresser wash her hair, ensuring she avoids the incision area.  She has been proactive in communicating with her healthcare providers to ensure timely follow-up.    Results PATHOLOGY Temporal artery biopsy: Findings consistent with temporal arteritis, either treated or resolved.   Review of Systems  Neurological:  Negative for headaches.  All other systems reviewed and are negative.      Objective:   Physical Exam Vitals reviewed.  HENT:     Head: Normocephalic.  Cardiovascular:     Rate and Rhythm: Normal rate.      Pulses: Normal pulses.  Pulmonary:     Effort: Pulmonary effort is normal.  Skin:    General: Skin is warm and dry.  Neurological:     Mental Status: She is alert and oriented to person, place, and time.  Psychiatric:        Mood and Affect: Mood normal.        Behavior: Behavior normal.        Thought Content: Thought content normal.        Judgment: Judgment normal.     Physical Exam SKIN: Incision healing well.  BP (!) 159/76 (BP Location: Left Arm, Patient Position: Sitting, Cuff Size: Normal)   Pulse 75   Resp 18   Ht 5' 8 (1.727 m)   Wt 133 lb 3.2 oz (60.4 kg)   BMI 20.25 kg/m   Past Medical History:  Diagnosis Date   Anemia    Anxiety    Arthritis    Blurred vision    CKD (chronic kidney disease), stage III (HCC)    Complication of anesthesia    slow to wake up after hysterectomy had to stay over night   Coronary artery disease    Degenerative disc disease, lumbar    Depression    Difficult airway for intubation    Patient told by anesthesiology that she should tell future providers to always use a pediatric ET tube due to narrow airway and prior surgical issues including scarring   Difficult intubation    Narrow airway.  Needs pediatric tube   Headache    Heart murmur  History of hiatal hernia    Hypertension    Hypothyroidism    PAD (peripheral artery disease)    Pneumonia    Temporal arteritis (HCC)    Vertigo    x1   Wears dentures    partial lower    Social History   Socioeconomic History   Marital status: Single    Spouse name: Not on file   Number of children: Not on file   Years of education: Not on file   Highest education level: Not on file  Occupational History   Not on file  Tobacco Use   Smoking status: Former   Smokeless tobacco: Never   Tobacco comments:    smoked some in her early 64s  Vaping Use   Vaping status: Never Used  Substance and Sexual Activity   Alcohol use: Yes    Alcohol/week: 3.0 standard drinks of  alcohol    Types: 3 Glasses of wine per week   Drug use: No   Sexual activity: Never  Other Topics Concern   Not on file  Social History Narrative   Lives alone   Social Drivers of Health   Financial Resource Strain: Low Risk  (12/12/2023)   Received from [facility]   Overall Financial Resource Strain (CARDIA)    Difficulty of Paying Living Expenses: Not hard at all  Food Insecurity: No Food Insecurity (12/12/2023)   Received from [facility]   Hunger Vital Sign    Within the past 12 months, you worried that your food would run out before you got the money to buy more.: Never true    Within the past 12 months, the food you bought just didn't last and you didn't have money to get more.: Never true  Transportation Needs: No Transportation Needs (12/12/2023)   Received from [facility] - Transportation    In the past 12 months, has lack of transportation kept you from medical appointments or from getting medications?: No    Lack of Transportation (Non-Medical): No  Physical Activity: Not on file  Stress: Not on file  Social Connections: Not on file  Intimate Partner Violence: Not on file    Past Surgical History:  Procedure Laterality Date   ARTERY BIOPSY Right 01/24/2024   Procedure: BIOPSY TEMPORAL ARTERY;  Surgeon: Marea Selinda RAMAN, MD;  Location: [facility];  Service: Vascular;  Laterality: Right;   CATARACT EXTRACTION W/PHACO Left 01/20/2020   Procedure: CATARACT EXTRACTION PHACO AND INTRAOCULAR LENS PLACEMENT (IOC) LEFT 6.48 00:42.2;  Surgeon: Jaye Fallow, MD;  Location: [facility];  Service: Ophthalmology;  Laterality: Left;   CATARACT EXTRACTION W/PHACO Right 03/16/2020   Procedure: CATARACT EXTRACTION PHACO AND INTRAOCULAR LENS PLACEMENT (IOC) RIGHT;  Surgeon: Jaye Fallow, MD;  Location: [facility];  Service: Ophthalmology;  Laterality: Right;  6.11 0:43.5   SALIVARY GLAND SURGERY      THYROID  SURGERY  2000   total vaginal hysterectomy, bilateral uterosacral ligament vaginal vault suspension, anterior colporrhaphy, perineorrhaphy, cystourethroscopy surgery on 08/11/2020  2022   TUBAL LIGATION      Family History  Problem Relation Age of Onset   Breast cancer Maternal Aunt        two aunts   Breast cancer Cousin        maternal    Allergies  Allergen Reactions   Lisinopril Anaphylaxis   Amlodipine  Swelling    Tongue swelling    Contrast Media [Iodinated Contrast Media] Hives  and Nausea Only   Codeine Rash   Penicillins Rash and Other (See Comments)    Has patient had a PCN reaction causing immediate rash, facial/tongue/throat swelling, SOB or lightheadedness with hypotension: Unknown Has patient had a PCN reaction causing severe rash involving mucus membranes or skin necrosis: No Has patient had a PCN reaction that required hospitalization: No Has patient had a PCN reaction occurring within the last 10 years: No If all of the above answers are NO, then may proceed with Cephalosporin use.        Latest Ref Rng & Units 01/23/2024   10:21 AM 01/11/2024   11:33 AM 02/22/2020   10:53 AM  CBC  WBC 4.0 - 10.5 K/uL 9.6  8.5  7.8   Hemoglobin 12.0 - 15.0 g/dL 89.1  89.3  88.3   Hematocrit 36.0 - 46.0 % 33.0  31.8  34.2   Platelets 150 - 400 K/uL 302  225  237       CMP     Component Value Date/Time   NA 139 01/23/2024 1021   NA 139 12/30/2012 1711   K 4.0 01/23/2024 1021   K 3.8 12/30/2012 1711   CL 104 01/23/2024 1021   CL 109 (H) 12/30/2012 1711   CO2 25 01/23/2024 1021   CO2 23 12/30/2012 1711   GLUCOSE 105 (H) 01/23/2024 1021   GLUCOSE 87 12/30/2012 1711   BUN 22 01/23/2024 1021   BUN 20 (H) 12/30/2012 1711   CREATININE 1.21 (H) 01/23/2024 1021   CREATININE 1.42 (H) 12/30/2012 1711   CALCIUM 9.0 01/23/2024 1021   CALCIUM 8.4 (L) 12/30/2012 1711   PROT 6.8 06/09/2017 0102   PROT 6.9 12/30/2012 1711   ALBUMIN 3.6 06/09/2017 0102   ALBUMIN  3.3 (L) 12/30/2012 1711   AST 23 06/09/2017 0102   AST 32 12/30/2012 1711   ALT 14 06/09/2017 0102   ALT 23 12/30/2012 1711   ALKPHOS 66 06/09/2017 0102   ALKPHOS 72 12/30/2012 1711   BILITOT 0.6 06/09/2017 0102   BILITOT 0.2 12/30/2012 1711   GFRNONAA 44 (L) 01/23/2024 1021   GFRNONAA 37 (L) 12/30/2012 1711     No results found.     Assessment & Plan:   1. 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 Previous prednisone course ineffective. Headaches improved post-biopsy. Concerns about recurrence and long-term steroid use. Coordination with primary care necessary. - Faxed test results to primary care physician's office. - Ensure follow-up with primary care physician for further management and treatment decisions.   2. Encounter for postoperative wound care Post-temporal artery biopsy wound care Incision healing well with glue present, no infection or complications. - Advised gentle washing of incision area with shampoo. - Recommended using alcohol or acetone to remove glue after a few more weeks if not peeling off. - Instructed to avoid aggressive scrubbing of incision area. - Allowed hairdresser to wash hair but advised to avoid scratching incision area.    Current Outpatient Medications on File Prior to Visit  Medication Sig Dispense Refill   Apoaequorin (PREVAGEN PO) Take 1 tablet by mouth daily.     Ascorbic Acid (VITAMIN C PO) Take 1 tablet by mouth daily at 12 noon.     aspirin EC 81 MG tablet Take 81 mg by mouth daily. Swallow whole.     b complex vitamins capsule Take 1 capsule by mouth daily.  ezetimibe (ZETIA) 10 MG tablet Take 10 mg by mouth daily.     famotidine (PEPCID) 20 MG tablet Take 20 mg by mouth daily as needed for heartburn.     levothyroxine  (SYNTHROID , LEVOTHROID) 75 MCG tablet Take 75 mcg by mouth  daily before breakfast.     LUTEIN PO Take 1 tablet by mouth daily at 12 noon.     Menthol-Methyl Salicylate (SALONPAS PAIN RELIEF PATCH EX) Apply 1 patch topically daily as needed (pain).     Multiple Vitamin (MULTIVITAMIN) tablet Take 1 tablet by mouth daily.     Multiple Vitamins-Minerals (PRESERVISION AREDS 2 PO) Take 2 capsules by mouth daily at 12 noon.     olmesartan (BENICAR) 5 MG tablet Take 5 mg by mouth daily.     rosuvastatin (CRESTOR) 5 MG tablet Take 5 mg by mouth daily.     VITAMIN E PO Take 1 capsule by mouth daily at 12 noon.     zolpidem  (AMBIEN ) 5 MG tablet Take 5 mg by mouth at bedtime as needed for sleep.     calcium-vitamin D (OSCAL WITH D) 500-200 MG-UNIT tablet Take 1 tablet by mouth. (Patient not taking: Reported on 02/07/2024)     ciclopirox (PENLAC) 8 % solution Apply topically at bedtime. Apply over nail and surrounding skin. Apply daily over previous coat. After seven (7) days, may remove with alcohol and continue cycle. (Patient not taking: Reported on 02/07/2024)     fluocinonide cream (LIDEX) 0.05 % Apply 1 application topically 2 (two) times daily. (Patient not taking: Reported on 02/07/2024)     No current facility-administered medications on file prior to visit.    There are no Patient Instructions on file for this visit. No follow-ups on file.   Madine Sarr E Herlinda Heady, NP

## 2024-02-16 ENCOUNTER — Encounter: Payer: Self-pay | Admitting: *Deleted

## 2024-02-16 ENCOUNTER — Other Ambulatory Visit: Payer: Self-pay

## 2024-02-16 NOTE — ED Triage Notes (Signed)
 Pt says she was getting dressed for bed and she noticed rash areas that started on her legs, and now all over. Itchy. No new products, medications or foods. Used benadryl and cortisone cream. No breathing difficulties or trouble swallowing.

## 2024-02-17 ENCOUNTER — Emergency Department
Admission: EM | Admit: 2024-02-17 | Discharge: 2024-02-17 | Disposition: A | Discharge status: Home / Self Care | Attending: Emergency Medicine | Admitting: Emergency Medicine

## 2024-02-17 DIAGNOSIS — T7840XA Allergy, unspecified, initial encounter: Secondary | ICD-10-CM

## 2024-02-17 MED ORDER — EPINEPHRINE 0.3 MG/0.3ML IJ SOAJ: 0.3000 mg | INTRAMUSCULAR | 1 refills | Status: AC | PRN

## 2024-02-17 NOTE — Discharge Instructions (Addendum)
 It is unclear what you could have reacted today.  You should follow-up with your primary care doctor or ENT to discuss allergy testing.  Take Allegra or Zyrtec once daily Take benadryl 25 mg once in rash returns.  If rash is not going away then please return to the emergency room for repeat evaluation, you develop any breathing issues or any other concern Use EpiPen  for future reaction in which you cannot breathe.  If you have to use it then please return to the ER for repeat evaluation.

## 2024-02-17 NOTE — ED Provider Notes (Signed)
 [facility] Provider Note    Event Date/Time   First MD Initiated Contact with Patient 02/17/24 0259     (approximate)   History   Rash   HPI  Kristi Maldonado is a 83 y.o. female with history of temporal arteritis who comes in with concerns for rash.  Patient denies any new changes today.  She denies any new medications, foods or products.  The only thing she can think of is that she does take Celebrex for some chronic back pain and she had not taken it for a month and did take it today.  She reports having a red rash that was on her legs her torso her hands.  She denies any tick bites.  She reports using Benadryl and cortisone cream.  Denies any difficulty swallowing breathing, swelling to her face.  She did report that it was itchy in nature.  She had been waiting in the emergency room for 4 hours and on evaluation now she reports complete resolution of symptoms.  She denies any history of allergic reaction   Physical Exam   Triage Vital Signs: ED Triage Vitals  Encounter Vitals Group     BP 02/16/24 2324 (!) 181/73     Girls Systolic BP Percentile --      Girls Diastolic BP Percentile --      Boys Systolic BP Percentile --      Boys Diastolic BP Percentile --      Pulse Rate 02/16/24 2324 63     Resp 02/16/24 2324 20     Temp 02/16/24 2324 97.6 F (36.4 C)     Temp Source 02/16/24 2324 Oral     SpO2 02/16/24 2324 99 %     Weight --      Height --      Head Circumference --      Peak Flow --      Pain Score 02/17/24 0306 0     Pain Loc --      Pain Education --      Exclude from Growth Chart --     Most recent vital signs: Vitals:   02/16/24 2324 02/17/24 0327  BP: (!) 181/73 (!) 156/87  Pulse: 63 (!) 56  Resp: 20 16  Temp: 97.6 F (36.4 C) 97.7 F (36.5 C)  SpO2: 99% 100%     General: Awake, no distress.  CV:  Good peripheral perfusion.  Resp:  Normal effort.  Abd:  No distention.  Other:  No rash noted on the hands or feet.  No  rash noted on the chest abdomen pelvis.  Patient tolerating secretions without any swelling noted to her lips or face.  Uvula midline.   ED Results / Procedures / Treatments   Labs (all labs ordered are listed, but only abnormal results are displayed) Labs Reviewed - No data to display    PROCEDURES:  Critical Care performed: No  Procedures   MEDICATIONS ORDERED IN ED: Medications - No data to display   IMPRESSION / MDM / ASSESSMENT AND PLAN / ED COURSE  I reviewed the triage vital signs and the nursing notes.   Patient's presentation is most consistent with acute, uncomplicated illness.   Patient comes in with itchy rash that suspect was some type of allergic reaction.  On evaluation now she has got no residual rash and this was only treated with some creams.  We discussed unclear what could have caused the rash.  Seems unlikely related to  the Celebrex if she has been on that previously for years without any issues but she is going to follow-up with her primary care doctor and hold off on taking it until further discussion.  We discussed that if the rash comes back and it is mild she can just take some Benadryl or Zyrtec, Allegra during the day.  If it is staying persistent does not go away she can return to the ER.  I will prescribe an EpiPen  in case future reaction is worse where she cannot breathe and she expressed understanding.  She knows to use this only for a future reaction given unclear what she could have been reacting to.  I will also give her number for ENT to follow-up for potential allergy testing if this continues to be an issue for but at this time patient has had complete resolution of symptoms and feels comfortable with discharge home and monitoring symptoms    FINAL CLINICAL IMPRESSION(S) / ED DIAGNOSES   Final diagnoses:  Allergic reaction, initial encounter     Rx / DC Orders   ED Discharge Orders          Ordered    EPINEPHrine  0.3 mg/0.3 mL IJ SOAJ  injection  As needed        02/17/24 9663             Note:  This document was prepared using Dragon voice recognition software and may include unintentional dictation errors.   Ernest Ronal BRAVO, MD 02/17/24 [phone]

## 2024-03-17 NOTE — Progress Notes (Signed)
 "                                    Patient Profile:   Kristi Maldonado  is a 84 y.o.  female Chief Complaint  Patient presents with   Chest Pain    She noticed yesterday and this morning after eating. She treated with Pepto Bismol but felt no relief.   Rash    Has noticed since yesterday in palms of hand.      PROBLEM LIST: Past Medical History:  Diagnosis Date   Anesthesia complication    Has a narrow airway and had some scarring with intubation for thyroidectomy   Essential hypertension, benign    History of bone density study 05/02/2005   History of bone density study 05/22/2007   History of bone density study 09/15/2009   History of bone density study 10/17/2011   History of sciatica    Microscopic hematuria    evaluated 2011 Dr Kassie cystoscopy normal    Osteopenia    noted on BMD in 10/2011   PAD (peripheral artery disease)    Per patietn 08/28/2021   Unspecified hypothyroidism     Past Surgical History:  Procedure Laterality Date   COLONOSCOPY  08/13/1998   Hyperplastic Polyp   THYROIDECTOMY TOTAL  2001   COLONOSCOPY  05/07/2003   COLONOSCOPY  05/24/2010   COLONOSCOPY  08/01/2013   Hyperplastic Polyps: No repeat due to age per RTE   HYSTERECTOMY W/REMOVAL TUBES &/OR OVARIES VAGINAL N/A 08/11/2020   Procedure: VAGINAL HYSTERECTOMY WITH REMOVAL OF TUBE(S), AND/OR OVARY(S);  Surgeon: Dorice Donald BROCKS, MD;  Location: [facility];  Service: Gynecology;  Laterality: N/A;   COLPORRHAPHY ANTERIOR-POSTERIOR N/A 08/11/2020   Procedure: COMBINED ANTEROPOSTERIOR COLPORRHAPHY, INCLUDING CYSTOURETHROSCOPY;  Surgeon: Dorice Donald BROCKS, MD;  Location: [facility];  Service: Gynecology;  Laterality: N/A;   REPAIR VAGINAL PROLAPSE W/WO SACROSPINOUS LIGAMENT SUSPENSION N/A 08/11/2020   Procedure: COLPOPEXY, VAGINAL; EXTRA-PERITONEAL APPROACH (SACROSPINOUS, ILIOCOCCYGEUS);  Surgeon: Dorice Donald BROCKS, MD;  Location: [facility];  Service: Gynecology;  Laterality: N/A;    CYSTOURETHROSCOPY N/A 08/11/2020   Procedure: CYSTOURETHROSCOPY;  Surgeon: Dorice Donald BROCKS, MD;  Location: [facility];  Service: Gynecology;  Laterality: N/A;   CATARACT EXTRACTION     ESSURE TUBAL LIGATION     EXCISION SALIVARY GLAND     OTHER SURGERY     Biopsy scalp 2025   Removal of right sub mandibular gland because of sialadenitis and sialalithiasis      ALLERGIES: Allergies  Allergen Reactions   Lisinopril Swelling   Amlodipine  Swelling    Tongue swelling   Codeine Other (See Comments)    Disorientation   Codiclear Dh [Hydrocodone-Guaifenesin] Other (See Comments)    disorients   Zoloft [Sertraline] Nausea    CURRENT MEDICATIONS: Current Outpatient Medications  Medication Sig Dispense Refill   acetaminophen  (TYLENOL ) 650 MG ER tablet Take 650 mg by mouth every 8 (eight) hours as needed for Pain     ALPRAZolam  (XANAX ) 0.25 MG tablet Take 0.25 mg by mouth once daily as needed     apoaequorin (PREVAGEN) capsule Take 1 capsule by mouth once daily     ascorbic acid, vitamin C, (VITAMIN C) 500 MG tablet Take 500 mg by mouth once daily     aspirin 81 MG EC tablet Take 1 tablet (81 mg total) by mouth once daily     bisacodyL (DULCOLAX) 5 mg EC tablet  Take 5 mg by mouth once daily as needed for Constipation     blood pressure test kit-large Kit      calcium carbonate-vitamin D3 (OS-CAL 500+D) 500 mg(1,250mg ) -200 unit tablet Take 1 tablet by mouth 2 (two) times daily with meals     celecoxib (CELEBREX) 100 MG capsule Take 1 capsule (100 mg total) by mouth 2 (two) times daily as needed for Pain 60 capsule 1   cyanocobalamin (VITAMIN B12) 1000 MCG tablet Take 1,000 mcg by mouth once daily     diclofenac (VOLTAREN) 1 % topical gel Apply 2 g topically 2 (two) times daily 50 g 0   ezetimibe (ZETIA) 10 mg tablet Take 1 tablet (10 mg total) by mouth once daily 90 tablet 3   levothyroxine  (SYNTHROID ) 75 MCG tablet TAKE 1 TABLET BY MOUTH DAILY TAKE ON AN EMPTY STOMACH  WITH A GLASS OF WATER AT LEAST 30-60 MINUTES BEFORE BREAKFAST 90 tablet 3   lutein 10 mg Tab Take 1 tablet by mouth once daily     mirtazapine (REMERON) 7.5 MG tablet Take 1 tablet (7.5 mg total) by mouth at bedtime 90 tablet 3   olmesartan (BENICAR) 5 MG tablet TAKE 1 TABLET BY MOUTH DAILY 90 tablet 3   rosuvastatin (CRESTOR) 5 MG tablet Take 1 tablet (5 mg total) by mouth once daily 90 tablet 3   vit C/E/cuperic/zinc/lutein (PRESERVISION LUTEIN ORAL) Take 2 tablets by mouth once daily     vitamin E 400 UNIT capsule Take 400 Units by mouth once daily     ZINC ORAL Take 1 tablet by mouth once daily     pantoprazole (PROTONIX) 40 MG DR tablet Take 1 tablet (40 mg total) by mouth once daily 30 tablet 2   No current facility-administered medications for this visit.      HPI   CLINICAL SUMMARY:  Patient has been having left-sided chest pain radiating to her left shoulder, always postprandial, little better today has been drinking white a bit fruit juice, soda pop, eating peppermint.  No exertional component to it.  She does have some itchy skin as well  ROS: Review of systems is unremarkable for any active cardiac, respiratory, GI, GU, hematologic, neurologic, dermatologic, HEENT, or psychiatric symptoms except as noted above, 10 systems reviewed.  No fevers, chills, or constitutional symptoms.   PHYSICAL EXAM  Vital signs:  BP 120/70   Pulse 63   Wt 59.5 kg (131 lb 3.2 oz)   LMP  (LMP Unknown)   SpO2 97%   BMI 20.54 kg/m  Body mass index is 20.54 kg/m.   Wt Readings from Last 3 Encounters:  03/17/24 59.5 kg (131 lb 3.2 oz)  03/10/24 60.8 kg (134 lb 0.6 oz)  02/12/24 60.8 kg (134 lb)     BP Readings from Last 3 Encounters:  03/17/24 120/70  03/10/24 129/62  02/12/24 110/60    Constitutional:NAD Neck: supple, no thyromegaly, good ROM Respiratory:clear to auscultation, no rales or wheezes Cardiovascular:RRR, no murmur or gallop Abdominal:soft, good BS, NT Ext: no  edema, good peripheral pulses Neuro: alert and oriented X 3, grossly nonfocal     ASSESSMENT/PLAN   Atypical chest pain-EKG shows normal sinus rhythm without ischemic changes, no sign for acute coronary syndrome.  Symptoms and exam consistent with GERD, patient known to have GERD, will alter diet with less carbonation, no fruit juice, stop peppermint, Protonix 40 mg daily for 1-2 months, any changes symptoms will let me know Itching skin-no rash evident, will use  combination of thick lotion and hydrocortisone cream  Dispo:   No follow-ups on file.     "

## 2024-03-19 ENCOUNTER — Other Ambulatory Visit: Payer: Self-pay | Admitting: Internal Medicine

## 2024-03-19 ENCOUNTER — Ambulatory Visit
Admission: RE | Admit: 2024-03-19 | Discharge: 2024-03-19 | Disposition: A | Discharge status: Home / Self Care | Source: Ambulatory Visit | Attending: Internal Medicine | Admitting: Internal Medicine

## 2024-03-19 DIAGNOSIS — R10821 Right upper quadrant rebound abdominal tenderness: Secondary | ICD-10-CM

## 2024-03-19 DIAGNOSIS — R1084 Generalized abdominal pain: Secondary | ICD-10-CM | POA: Diagnosis present
